# Patient Record
Sex: Female | Born: 1960 | Race: White | Hispanic: No | Marital: Married | Smoking: Never smoker
Health system: Southern US, Community
[De-identification: ages and names within clinical notes are randomized; demographics above are authoritative.]

## PROBLEM LIST (undated history)

## (undated) DIAGNOSIS — M3500 Sicca syndrome, unspecified: Secondary | ICD-10-CM

## (undated) DIAGNOSIS — F329 Major depressive disorder, single episode, unspecified: Secondary | ICD-10-CM

## (undated) DIAGNOSIS — I10 Essential (primary) hypertension: Secondary | ICD-10-CM

## (undated) DIAGNOSIS — K8020 Calculus of gallbladder without cholecystitis without obstruction: Secondary | ICD-10-CM

## (undated) DIAGNOSIS — M1990 Unspecified osteoarthritis, unspecified site: Secondary | ICD-10-CM

## (undated) DIAGNOSIS — F32A Depression, unspecified: Secondary | ICD-10-CM

## (undated) DIAGNOSIS — M797 Fibromyalgia: Secondary | ICD-10-CM

## (undated) HISTORY — DX: Sjogren syndrome, unspecified: M35.00

## (undated) HISTORY — DX: Essential (primary) hypertension: I10

## (undated) HISTORY — DX: Unspecified osteoarthritis, unspecified site: M19.90

## (undated) HISTORY — PX: ABDOMINAL HYSTERECTOMY: SHX81

## (undated) HISTORY — DX: Depression, unspecified: F32.A

## (undated) HISTORY — DX: Major depressive disorder, single episode, unspecified: F32.9

## (undated) HISTORY — DX: Fibromyalgia: M79.7

---

## 2000-05-12 ENCOUNTER — Encounter: Payer: Self-pay | Admitting: General Surgery

## 2000-05-16 ENCOUNTER — Ambulatory Visit (HOSPITAL_COMMUNITY): Admission: RE | Admit: 2000-05-16 | Discharge: 2000-05-16 | Payer: Self-pay | Admitting: General Surgery

## 2000-05-16 ENCOUNTER — Encounter: Payer: Self-pay | Admitting: General Surgery

## 2001-02-22 ENCOUNTER — Ambulatory Visit (HOSPITAL_COMMUNITY): Admission: RE | Admit: 2001-02-22 | Discharge: 2001-02-22 | Payer: Self-pay | Admitting: Cardiology

## 2001-05-30 ENCOUNTER — Ambulatory Visit (HOSPITAL_COMMUNITY)
Admission: RE | Admit: 2001-05-30 | Discharge: 2001-05-30 | Payer: Self-pay | Admitting: Certified Registered Nurse Anesthetist

## 2001-10-19 ENCOUNTER — Encounter: Payer: Self-pay | Admitting: Family Medicine

## 2001-10-19 ENCOUNTER — Ambulatory Visit (HOSPITAL_COMMUNITY): Admission: RE | Admit: 2001-10-19 | Discharge: 2001-10-19 | Payer: Self-pay | Admitting: Family Medicine

## 2001-11-14 ENCOUNTER — Inpatient Hospital Stay (HOSPITAL_COMMUNITY): Admission: RE | Admit: 2001-11-14 | Discharge: 2001-11-16 | Payer: Self-pay | Admitting: Obstetrics and Gynecology

## 2001-11-14 ENCOUNTER — Encounter (INDEPENDENT_AMBULATORY_CARE_PROVIDER_SITE_OTHER): Payer: Self-pay | Admitting: Specialist

## 2003-05-05 ENCOUNTER — Encounter: Payer: Self-pay | Admitting: Family Medicine

## 2003-05-05 ENCOUNTER — Ambulatory Visit (HOSPITAL_COMMUNITY): Admission: RE | Admit: 2003-05-05 | Discharge: 2003-05-05 | Payer: Self-pay | Admitting: Family Medicine

## 2003-05-30 ENCOUNTER — Ambulatory Visit (HOSPITAL_COMMUNITY): Admission: RE | Admit: 2003-05-30 | Discharge: 2003-05-30 | Payer: Self-pay | Admitting: Cardiology

## 2004-09-15 ENCOUNTER — Ambulatory Visit: Payer: Self-pay | Admitting: Cardiology

## 2004-09-21 ENCOUNTER — Ambulatory Visit: Payer: Self-pay | Admitting: *Deleted

## 2004-09-21 ENCOUNTER — Ambulatory Visit (HOSPITAL_COMMUNITY): Admission: RE | Admit: 2004-09-21 | Discharge: 2004-09-21 | Payer: Self-pay | Admitting: *Deleted

## 2005-04-06 ENCOUNTER — Ambulatory Visit (HOSPITAL_COMMUNITY): Admission: RE | Admit: 2005-04-06 | Discharge: 2005-04-06 | Payer: Self-pay | Admitting: Obstetrics and Gynecology

## 2005-09-16 ENCOUNTER — Emergency Department (HOSPITAL_COMMUNITY): Admission: EM | Admit: 2005-09-16 | Discharge: 2005-09-16 | Payer: Self-pay | Admitting: Emergency Medicine

## 2005-09-20 ENCOUNTER — Ambulatory Visit: Payer: Self-pay | Admitting: Cardiology

## 2006-10-13 ENCOUNTER — Ambulatory Visit: Payer: Self-pay | Admitting: Cardiology

## 2006-10-20 ENCOUNTER — Ambulatory Visit (HOSPITAL_COMMUNITY): Admission: RE | Admit: 2006-10-20 | Discharge: 2006-10-20 | Payer: Self-pay | Admitting: Family Medicine

## 2007-10-23 ENCOUNTER — Ambulatory Visit: Payer: Self-pay | Admitting: Cardiology

## 2008-10-21 ENCOUNTER — Ambulatory Visit (HOSPITAL_COMMUNITY): Admission: RE | Admit: 2008-10-21 | Discharge: 2008-10-21 | Payer: Self-pay | Admitting: Family Medicine

## 2008-10-30 ENCOUNTER — Ambulatory Visit: Payer: Self-pay | Admitting: Cardiology

## 2008-10-31 ENCOUNTER — Ambulatory Visit: Payer: Self-pay | Admitting: Cardiology

## 2008-10-31 ENCOUNTER — Encounter: Payer: Self-pay | Admitting: Cardiology

## 2008-10-31 ENCOUNTER — Ambulatory Visit (HOSPITAL_COMMUNITY): Admission: RE | Admit: 2008-10-31 | Discharge: 2008-10-31 | Payer: Self-pay | Admitting: Cardiology

## 2009-03-19 ENCOUNTER — Ambulatory Visit (HOSPITAL_COMMUNITY): Admission: RE | Admit: 2009-03-19 | Discharge: 2009-03-19 | Payer: Self-pay | Admitting: Family Medicine

## 2009-04-29 ENCOUNTER — Encounter: Payer: Self-pay | Admitting: Gastroenterology

## 2009-05-14 ENCOUNTER — Ambulatory Visit: Payer: Self-pay | Admitting: Gastroenterology

## 2009-05-14 ENCOUNTER — Ambulatory Visit (HOSPITAL_COMMUNITY): Admission: RE | Admit: 2009-05-14 | Discharge: 2009-05-14 | Payer: Self-pay | Admitting: Gastroenterology

## 2009-05-14 ENCOUNTER — Encounter: Payer: Self-pay | Admitting: Gastroenterology

## 2009-07-02 ENCOUNTER — Encounter (INDEPENDENT_AMBULATORY_CARE_PROVIDER_SITE_OTHER): Payer: Self-pay | Admitting: *Deleted

## 2009-07-02 LAB — CONVERTED CEMR LAB
Albumin: 4.1 g/dL
CO2: 23 meq/L
Calcium: 9.2 mg/dL
Chloride: 103 meq/L
HDL: 54 mg/dL
LDL Cholesterol: 86 mg/dL
Potassium: 4.5 meq/L
Sodium: 141 meq/L
T3, Free: 109.9 pg/mL
TSH: 2.602 microintl units/mL
Total Protein: 7.4 g/dL
Triglycerides: 207 mg/dL

## 2009-12-14 ENCOUNTER — Encounter (INDEPENDENT_AMBULATORY_CARE_PROVIDER_SITE_OTHER): Payer: Self-pay | Admitting: *Deleted

## 2009-12-18 ENCOUNTER — Ambulatory Visit: Payer: Self-pay | Admitting: Cardiology

## 2009-12-18 DIAGNOSIS — I10 Essential (primary) hypertension: Secondary | ICD-10-CM | POA: Insufficient documentation

## 2009-12-18 DIAGNOSIS — I429 Cardiomyopathy, unspecified: Secondary | ICD-10-CM | POA: Insufficient documentation

## 2009-12-18 DIAGNOSIS — E663 Overweight: Secondary | ICD-10-CM | POA: Insufficient documentation

## 2010-10-13 ENCOUNTER — Ambulatory Visit: Payer: Self-pay | Admitting: Cardiology

## 2010-10-21 DIAGNOSIS — F329 Major depressive disorder, single episode, unspecified: Secondary | ICD-10-CM | POA: Insufficient documentation

## 2010-10-22 ENCOUNTER — Emergency Department (HOSPITAL_COMMUNITY)
Admission: EM | Admit: 2010-10-22 | Discharge: 2010-10-22 | Payer: Self-pay | Source: Home / Self Care | Admitting: Emergency Medicine

## 2010-11-14 ENCOUNTER — Encounter: Payer: Self-pay | Admitting: Family Medicine

## 2010-11-25 NOTE — Assessment & Plan Note (Signed)
Summary: pt has c/o some chest pain/t  Medications Added SERTRALINE HCL 50 MG TABS (SERTRALINE HCL) 1 tablet by mouth once daily EVENING PRIMROSE OIL 500 MG CAPS (EVENING PRIMROSE OIL) take 1 tablet by mouth at bedtime TOPAMAX 100 MG TABS (TOPIRAMATE) 1 and 1/2 tablet by mouth at bedtime FUROSEMIDE 20 MG TABS (FUROSEMIDE) as needed      Allergies Added: ! * SULFA  Primary Provider:  Dr. Margo Aye at [facility] in [place]   History of Present Illness: Brittany Sweeney is a 50 y/o CF with history of cardiomyopathy with reduced EF in 2001.  She has since regained normal LV fx but has mild diastolic dysfunction with medical therapy.  She continues to have generalized body aches and pains and has been diagnosed with fibromyalgia.  She was placed on unknown medication for this, but states she  stopped taking it because "it made me feel worse."  She has lost 17 lbs since been seen last stating that a episode of severe depression in May.  She has since felt better and returned to work aftet taking 7 weeks off. She is without complaint otherwise.  Preventive Screening-Counseling & Management  Alcohol-Tobacco     Smoking Status: never  Current Problems (verified): 1)  Overweight/obesity  (ICD-278.02) 2)  Cardiomyopathy, Secondary  (ICD-425.9) 3)  Hypertension, Unspecified  (ICD-401.9)  Current Medications (verified): 1)  Coreg 12.5 Mg Tabs (Carvedilol) .... Take 1/2 Tab Two Times A Day 2)  Wellbutrin Xl 150 Mg Xr24h-Tab (Bupropion Hcl) .[name].. 1 Tab By Mouth Two Times A Day 3)  Aspirin Ec 325 Mg Tbec (Aspirin) .... Take One Tablet By Mouth Daily 4)  B-100 Complex  Tabs (Vitamins-Lipotropics) .... Take 1 Tab Daily 5)  Vitamin C Cr 500 Mg Cr-Tabs (Ascorbic Acid) .... Take 1 Tab Daily 6)  Sertraline Hcl 50 Mg Tabs (Sertraline Hcl) .[name].. 1 Tablet By Mouth Once Daily 7)  Evening Primrose Oil 500 Mg Caps (Evening Primrose Oil) .... Take 1 Tablet By Mouth At Bedtime 8)  Topamax 100 Mg Tabs  (Topiramate) .[name].. 1 and 1/2 Tablet By Mouth At Bedtime 9)  Furosemide 20 Mg Tabs (Furosemide) .... As Needed  Allergies (verified): 1)  ! * Sulfa  Past History:  Past Medical History: Depression Fibromyalgia  Social History: Smoking Status:  never  Review of Systems       Generalized muscle aches and pains.  All other systems have been reviewed and are negative unless stated above.   Vital Signs:  Patient profile:   50 year old female Height:      65 inches Weight:      193 pounds BMI:     32.23 O2 Sat:      98 % Pulse rate:   82 / minute BP sitting:   129 / 81  (left arm)  Vitals Entered ByLarita Fife Via LPN (October 21, 2010 1:27 PM)  Physical Exam  General:  Well developed, well nourished, in no acute distress. Lungs:  Clear bilaterally to auscultation and percussion. Heart:  Non-displaced PMI, chest non-tender; regular rate and rhythm, S1, S2 without murmurs, rubs or gallops. Carotid upstroke normal, no bruit. Normal abdominal aortic size, no bruits. Femorals normal pulses, no bruits. Pedals normal pulses. No edema, no varicosities. Abdomen:  Bowel sounds positive; abdomen soft and non-tender without masses, organomegaly, or hernias noted. No hepatosplenomegaly. Msk:  joint tenderness.   Pulses:  pulses normal in all 4 extremities Extremities:  No clubbing or cyanosis. Neurologic:  Alert and  oriented x 3. Skin:  Intact without lesions or rashes. Psych:  depressed affect.     Impression & Recommendations:  Problem # 1:  CARDIOMYOPATHY, SECONDARY (ICD-425.9) She appears stable at this time.  Do not feel that she needs to be on lasix daily. I have advised her to take this as needed.  She prefers to take this daily to decrease fluid retention with menopause.   Her updated medication list for this problem includes:    Coreg 12.5 Mg Tabs (Carvedilol) .[name]... Take 1/2 tab two times a day    Aspirin Ec 325 Mg Tbec (Aspirin) .[name]... Take one tablet by mouth daily     Furosemide 20 Mg Tabs (Furosemide) .[name]... As needed  Problem # 2:  HYPERTENSION, UNSPECIFIED (ICD-401.9) Excellent control of BP at this time.   Her updated medication list for this problem includes:    Coreg 12.5 Mg Tabs (Carvedilol) .[name]... Take 1/2 tab two times a day    Aspirin Ec 325 Mg Tbec (Aspirin) .[name]... Take one tablet by mouth daily    Furosemide 20 Mg Tabs (Furosemide) .[name]... As needed  Problem # 3:  DEPRESSION (ICD-311) She is on multiple medications for same.  She wishes to talk with a counselor.  I have given her the name of Calton Dach LCSW in [place].  She will follow up on this on her own if she chooses to do so.  Patient Instructions: 1)  Your physician recommends that you schedule a follow-up appointment in: 6 months 2)  Your physician recommends that you continue on your current medications as directed. Please refer to the Current Medication list given to you today.  Prevention & Chronic Care Immunizations   Influenza vaccine: Not documented    Tetanus booster: Not documented    Pneumococcal vaccine: Not documented  Colorectal Screening   Hemoccult: Not documented    Colonoscopy: Not documented  Other Screening   Pap smear: Not documented    Mammogram: Not documented   Smoking status: never  (10/21/2010)  Lipids   Total Cholesterol: 181  (07/02/2009)   LDL: 86  (07/02/2009)   LDL Direct: Not documented   HDL: 54  (07/02/2009)   Triglycerides: 207  (07/02/2009)  Hypertension   Last Blood Pressure: 129 / 81  (10/21/2010)   Serum creatinine: 0.78  (07/02/2009)   Serum potassium 4.5  (07/02/2009)  Self-Management Support :    Hypertension self-management support: Not documented

## 2010-11-25 NOTE — Assessment & Plan Note (Signed)
Summary: rov  Medications Added WELLBUTRIN XL 150 MG XR24H-TAB (BUPROPION HCL) 1 tab by mouth two times a day ASPIRIN EC 325 MG TBEC (ASPIRIN) Take one tablet by mouth daily B-100 COMPLEX  TABS (VITAMINS-LIPOTROPICS) take 1 tab daily VITAMIN C CR 500 MG CR-TABS (ASCORBIC ACID) take 1 tab daily      Allergies Added: NKDA  Primary Provider:  Dr. Parke Simmers  CC:  Pt. c/o CP on left side of chest, SOB, and swelling in hands and feet and dizziness.[name]  History of Present Illness: Brittany Sweeney comes in today for evaluation management of her hypertension and secondary cardiomyopathy.  She says that she's been working weight to many hours up to 12 hour days. Her fibromyalgia is really bothering her. She's on her she should cut back at work.  She's compliant with her medications. She continues to gain weight. Echocardiogram last year showed normal left ventricular systolic function with no significant LVH. She did have some mild diastolic dysfunction. Right-sided pressures appear to be normal.  She denies any chest pain, dyspnea on exertion, orthopnea, PND peripheral edema.  Current Medications (verified): 1)  Coreg 12.5 Mg Tabs (Carvedilol) .... Take 1/2 Tab Two Times A Day 2)  Wellbutrin Xl 150 Mg Xr24h-Tab (Bupropion Hcl) .[name].. 1 Tab By Mouth Two Times A Day 3)  Aspirin Ec 325 Mg Tbec (Aspirin) .... Take One Tablet By Mouth Daily 4)  B-100 Complex  Tabs (Vitamins-Lipotropics) .... Take 1 Tab Daily 5)  Vitamin C Cr 500 Mg Cr-Tabs (Ascorbic Acid) .... Take 1 Tab Daily  Allergies (verified): No Known Drug Allergies  Review of Systems       negative other than history of present illness  Vital Signs:  Patient profile:   50 year old female Height:      65 inches Weight:      220 pounds BMI:     36.74 O2 Sat:      97 % Pulse rate:   86 / minute BP sitting:   125 / 80  (left arm)  Physical Exam  General:  obese.   Head:  normocephalic and atraumatic Eyes:  PERRLA/EOM intact;  conjunctiva and lids normal. Mouth:  Teeth, gums and palate normal. Oral mucosa normal. Neck:  Neck supple, no JVD. No masses, thyromegaly or abnormal cervical nodes. Chest Dreydon Cardenas:  no deformities or breast masses noted Lungs:  Clear bilaterally to auscultation and percussion. Heart:  PMI poorly appreciated, normal S1-S2, regular rate and rhythm, no gallop Msk:  Back normal, normal gait. Muscle strength and tone normal. Pulses:  pulses normal in all 4 extremities Extremities:  No clubbing or cyanosis. Neurologic:  Alert and oriented x 3. Skin:  Intact without lesions or rashes. Psych:  Normal affect.   EKG  Procedure date:  12/18/2009  Findings:      normal sinus rhythm, normal EKG.  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 Take 1/2 tab two times a day    Aspirin Ec 325 Mg Tbec (Aspirin) .[name]... Take one tablet by mouth daily  Problem # 2:  HYPERTENSION, UNSPECIFIED (ICD-401.9) Assessment: Improved  Her updated medication list for this problem includes:    Coreg 12.5 Mg Tabs (Carvedilol) .[name]... Take 1/2 tab two times a day    Aspirin Ec 325 Mg Tbec (Aspirin) .[name]... Take one tablet by mouth daily  Problem # 3:  OVERWEIGHT/OBESITY (ICD-278.02) Assessment: Deteriorated  Patient Instructions: 1)  Your physician recommends that you schedule a follow-up appointment in: 1 year 2)  Your physician recommends that you continue on your current medications as directed. Please refer to the Current Medication list given to you today.

## 2010-11-25 NOTE — Miscellaneous (Signed)
Summary: labs cmp,lipids,tsh,t4,t3,07/02/2009  Clinical Lists Changes  Observations: Added new observation of CALCIUM: 9.2 mg/dL (52/84/1324 40:10) Added new observation of ALBUMIN: 4.1 g/dL (27/25/3664 40:34) Added new observation of PROTEIN, TOT: 7.4 g/dL (74/25/9563 87:56) Added new observation of SGPT (ALT): 20 units/L (07/02/2009 10:23) Added new observation of SGOT (AST): 27 units/L (07/02/2009 10:23) Added new observation of ALK PHOS: 69 units/L (07/02/2009 10:23) Added new observation of GFR: 133.08 mL/min (07/02/2009 10:23) Added new observation of CREATININE: 0.78 mg/dL (43/32/9518 84:16) Added new observation of BUN: 13 mg/dL (60/63/0160 10:93) Added new observation of BG RANDOM: 91 mg/dL (23/55/7322 02:54) Added new observation of CO2 PLSM/SER: 23 meq/L (07/02/2009 10:23) Added new observation of CL SERUM: 103 meq/L (07/02/2009 10:23) Added new observation of K SERUM: 4.5 meq/L (07/02/2009 10:23) Added new observation of NA: 141 meq/L (07/02/2009 10:23) Added new observation of LDL: 86 mg/dL (27/03/2375 28:31) Added new observation of HDL: 54 mg/dL (51/76/1607 37:10) Added new observation of TRIGLYC TOT: 207 mg/dL (62/69/4854 62:70) Added new observation of CHOLESTEROL: 181 mg/dL (35/00/9381 82:99) Added new observation of TSH: 2.602 microintl units/mL (07/02/2009 10:23) Added new observation of T4, FREE: 6.7 ng/dL (37/16/9678 93:81) Added new observation of T3 FREE: 109.9 pg/mL (07/02/2009 10:23)

## 2011-03-08 NOTE — Assessment & Plan Note (Signed)
[facility]                       [facility] CARDIOLOGY OFFICE NOTE   Brittany Sweeney, Brittany Sweeney                     MRN:          [id number]  DATE:10/30/2008                            DOB:          09-Mar-1961    Brittany Sweeney comes in today for followup.  I saw her initially in 2001 for  reduced LV systolic function probably secondary to hypertension.  After  getting her blood pressure under control, her LV function improved.  Her  last echo actually was September 21, 2004, which showed normal left  ventricular systolic function with no significant hypertrophy.   She continues unfortunately gaining weight.  Her weight is now to 217  from 203 from October 23, 2007.   She has had a very stressful year in work.  She has had to work night  shifts and also had to get back to the floor.  She made a comment that  it has really been hard on her.  She still works incredibly hard.   She has had a lot of problems with fibromyalgia and Dr. Parke Simmers has placed  her on [place] daily.  She wants to know if that was associated with  weight gain, however, side effects are usually decreased appetite and  weight loss.   MEDICATIONS:  1. Coreg 6.25 b.i.d.  2. Lexapro 10 mg a day.  3. Vitamin B.  4. Primrose oil.  5. Savella daily.   She takes omeprazole p.r.n. as well as Lasix 20 mg p.r.n.   PHYSICAL EXAMINATION:  VITAL SIGNS:  Her blood pressure is excellent  today at 100/80, her pulse is 76 and regular.  She weighs 217.  HEENT:  Unremarkable.  NECK:  Carotid upstrokes were equal bilaterally without bruits, no JVD.  Thyroid is not enlarged.  Trachea is midline.  LUNGS:  Clear to auscultation and percussion.  HEART:  A poorly appreciated PMI, soft S1 and S2.  No S3 gallop.  ABDOMEN:  Soft, good bowel sounds.  No midline bruit.  No pulsatile  mass.  EXTREMITIES:  No cyanosis, clubbing, or edema.  Pulses are brisk.  NEUROLOGIC:  Intact.   Electrocardiogram is  normal.   Ms. Kass's blood pressure is under excellent control.  She does have  some episodes of swelling in her legs requiring furosemide.  The left  leg seems to be worse in the right.  It has been about 4 years since she  has had a 2-D echocardiogram, we will reassess this with a  repeat echo.  I have encouraged her to try and lose weight.  We will see  her back again in 6 months.     Thomas C. Daleen Squibb, MD, [facility]  Electronically Signed    TCW/MedQ  DD: 10/30/2008  DT: 10/31/2008  Job #: [id number]   cc:   Renaye Rakers, M.D.

## 2011-03-08 NOTE — Assessment & Plan Note (Signed)
[facility]                            CARDIOLOGY OFFICE NOTE   Brittany Sweeney, Brittany Sweeney                     MRN:          [id number]  DATE:10/23/2007                            DOB:          05/14/1961    [name] returns today for further management of the following  issues.  1. History of hypertensive cardiomyopathy, now resolved with good      blood pressure control.  2. Hypertension well-controlled on Carvedilol  3. Continued weight gain.   She continues to work very hard.  She has off this week and had off week  of Christmas.   She was not feeling well on Wellbutrin and we switched her to Lexapro 10  mg a day.  She is doing better on this.   She takes Lasix 20 mg p.r.n. for swelling.   EXAM:  Today her blood pressure is 110/64, pulse 68 and regular.  Weight  is 203, up 7 more pounds.  HEENT:  Normocephalic atraumatic.  PERRLA.  Extraocular movements  intact.  Sclerae are clear.  Dentition satisfactory.  Neck is supple.  Carotids were equal bilateral bruits, no JVD.  Thyroid  is not enlarged.  Trachea is midline.  Lungs were clear.  HEART:  Reveals a poorly appreciated PMI.  Normal S1-S2.  No gallop.  ABDOMEN:  Exam is soft, good bowel sounds.  EXTREMITIES:  1+ edema.  Pulses are intact.  NEURO:  Exam is intact.   I have asked [name] to try and get control of her weight and  certainly not gain any more.  I have encouraged regular exercise as  usual.  We renewed her Carvedilol by E-prescribe.  We made no other  changes.  Will plan on seeing her back in a year.     Thomas C. Daleen Squibb, MD, [facility]  Electronically Signed    TCW/MedQ  DD: 10/23/2007  DT: 10/23/2007  Job #: [id number]   cc:   Renaye Rakers, M.D.

## 2011-03-11 NOTE — Op Note (Signed)
[facility]  Patient:    Brittany Sweeney, Brittany Sweeney Visit Number: [id number] MRN: [id number]          Service Type: GYN Location: 4W 0455 01 Attending Physician:  Lendon Colonel Dictated by:   Kathie Rhodes. Kyra Manges, M.D. Proc. Date: 11/14/01 Admit Date:  11/14/2001                             Operative Report  PREOPERATIVE DIAGNOSES: 1. Dysmenorrhea. 2. Stress urinary incontinence. 3. Pelvic pain. 4. Large cystocele and rectocele.  POSTOPERATIVE DIAGNOSES: 1. Dysmenorrhea. 2. Stress urinary incontinence. 3. Pelvic pain. 4. Large cystocele and rectocele.  OPERATION: 1. Exploratory laparotomy. 2. Total abdominal hysterectomy. 3. Left salpingo-oophorectomy. 4. Fulguration of endometriosis of right ovary. 5. Repair of enterocele. 6. Vaginal vault suspension, Burch procedure. 7. Posterior repair.  SURGEON:  Katherine Roan, M.D.  ANESTHESIA:  DESCRIPTION OF PROCEDURE:  The patient was placed in the supine position and prepped and draped in the usual fashion. A Foley catheter was inserted. The patient was in the low lithotomy position and the abdomen was entered through a transverse approach. Exploration of the upper abdomen revealed a smooth liver. The gallbladder was quite tense. I felt no stones. The kidneys were normal. No significant adenopathy was noted. The abdominal viscera were then carefully packed away from the pelvic viscera with soaked sponged. The uterus was elevated. The left ovary was adherent to the pelvic sidewall and had endometriosis on its surface. I freed up the left infundibulopelvic ligament and round ligament and removed the left ovary. Cornual aspects of the uterus were grasped and the right ovary was preserved, although it had surface endometriosis on it. Cornual aspects of the utero-ovarian ligament on the right were clamped and ligated as well as the round ligament. The ovary was then fulgurated and the endometriosis  was fulgurated. The bladder flap was dissected off the lower uterine segment. There was a marked amount of scarring in the uterus, particularly on the right side. Both uterine arteries were skeletonized and ligated. Cardinals and uterosacral ligaments were identified, the bladder was pushed off the lower segment. The angles of the vagina were entered and the specimen was removed from the operative field. The vaginal vault was then reconstructed, after closing the vagina, with figure-of-eight of 0 Vicryls. Following this, I suspended the vagina with the uterosacral ligaments and cardinal ligaments and then I repaired the posterior enterocele with interrupted sutures of 2-0 Ethibond. This effected a good vault support. Hemostasis was secure. I closed over the anterior surface of the bladder with interrupted sutures of 3-0 Vicryl. We then closed the peritoneal cavity, sponge and needle count were correct, and dissected the space of Retzius. WE then identified the vagina and suspended the vagina with 0 Ethibond to the inguinal ligament. This effected an excellent vault support. Following this, we irrigated the retroperitoneal space and then closed the fascia with interrupted and continuous 2-0 PDS suture. The subcutaneum was closed and then the skin was closed with 4-0 PDS. The incision was infiltrated with 0.5% Marcaine with epinephrine. We went down below and dissected the perineal body and dissected the vagina from the underlying rectum and then plicated the _______ fascia in the midline to repair the rectocele; 3-0 Vicryl was used. The vagina was then excised and closed with a running suture of 3-0 chromic. Pack was not inserted. Perineal skin was closed with interrupted sutures of 3-0 Vicryl and a continuation  of the subcuticular 3-0 chromic. The vagina was hemostatically secure. The Foley catheter drained clear urine. Bisma tolerated the procedure well. Estimated blood loss was about 200  cc. She was brought to the recovery room in good condition. Dictated by:   S. Kyra Manges, M.D. Attending Physician:  Lendon Colonel DD:  11/14/01 TD:  11/15/01 Job: [id number]

## 2011-03-11 NOTE — Assessment & Plan Note (Signed)
[facility]                            CARDIOLOGY OFFICE NOTE   Brittany Sweeney, Brittany Sweeney                     MRN:          [id number]  DATE:10/13/2006                            DOB:          11/20/1960    Brittany Sweeney returns today for further management of her hypertension.  She initially had a hypertension cardiomyopathy but her EF and LV  function normalized with appropriate therapy.   Her last 2D echocardiogram was in 2005.  This showed an EF that was  essentially normal and has been as high as 55-60% in the past.   She has had a good year.  She is working hard third shift, stands on the  concrete a lot.  She has some lower extremity edema when she gets home.  She denies any orthopnea, PND, or dyspnea on exertion.   Unfortunately she has not lost any weight.   MEDICATIONS:  1. Coreg 6.25 b.i.d.  2. Wellbutrin 300 mg a day.   Her blood pressure today is 129/83, pulse 71 and regular, her weight is  196.  HEENT:  Normocephalic/atraumatic, PERRLA, extraocular muscles are  intact, sclerae are clear, facial symmetry is normal, dentition  satisfactory.  Carotid upstrokes are equal bilaterally without bruits,  no JVD, thyroid is not enlarged, trachea is midline.  LUNGS:  Clear to auscultation and percussion.  HEART:  Reveals a nondisplaced PMI, she has normal S1, S2.  There is no  gallop, there is no murmur.  ABDOMEN:  Soft with good bowel sounds, no midline bruit.  EXTREMITIES:  No cyanosis, clubbing, there is trivial edema, pulses are  present.  NEURO:  Exam is intact.  MUSCULOSKELETAL:  Intact.  SKIN:  Intact.   EKG shows sinus rhythm with non specific T-wave changes.   ASSESSMENT/PLAN:  Brittany Sweeney is stable.  I have renewed her carvedilol  as a generic, 6.25 mg b.i.d. for a year.  We will see her back in a  year.     Thomas C. Daleen Squibb, MD, [facility]  Electronically Signed   TCW/MedQ  DD: 10/13/2006  DT: 10/14/2006  Job #: [id number]   cc:    Renaye Rakers, M.D.

## 2011-03-11 NOTE — Op Note (Signed)
Brittany Sweeney, Brittany Sweeney              ACCOUNT NO.:  [id number]   MEDICAL RECORD NO.:  [id number]          PATIENT TYPE:  AMB   LOCATION:  DAY                           FACILITY:  [facility]   PHYSICIAN:  Kassie Mends, M.D.      DATE OF BIRTH:  Apr 07, 1961   DATE OF PROCEDURE:  05/18/2009  DATE OF DISCHARGE:  05/14/2009                               OPERATIVE REPORT   REFERRING PHYSICIAN:  Renaye Rakers, MD   PROCEDURE:  Colonoscopy converted to sigmoidoscopy due to poor bowel  prep.   INDICATION FOR EXAM:  [name] is a 50 year old female, who presents  for average risk colon cancer screening.   FINDINGS:  1. Large amount of liquid stool with particulate matter and could not      be aspirated.  The liquid material began in the rectum and extended      onto the distal transverse colon.  The procedure was changed to a      sigmoidoscopy.  Able to aspirate to get adequate visualization of      the left side of the colon.  2. A 3-mm sessile rectal polyp removed via cold forceps.  Otherwise,      no evidence of masses, inflammatory changes, or AVMs.  Polyps less      than 5-mm may have been missed in the left colon.   RECOMMENDATIONS:  1. Brittany Sweeney only took half of her TriLyte prep.  She reportedly had      solid food on yesterday.  2. She should either complete other half of the GoLYTELY or complete      gallon of GoLYTELY and return for colonoscopy.  3. No aspirin, NSAIDs, or anticoagulation for 7 days.  4. She should follow a high-fiber diet.  She is given a handout on      high-fiber diet and polyps.   PROCEDURE TECHNIQUE:  Physical exam was performed.  Informed consent was  obtained from the patient after explaining the benefits, risks, and  alternatives to the procedure.  The patient was connected to the monitor  and placed in the left lateral position.  Continuous oxygen was provided  by nasal cannula and IV medicine administered through an indwelling  cannula.  After  administration of sedation and rectal exam, the  patient's rectum was intubated and the scope was advanced under direct  visualization to the splenic flexure.  The scope was removed slowly by  carefully examining the color, texture, anatomy, and integrity of the  mucosa on the way out.  The patient was recovered in endoscopy and  discharged home in satisfactory condition.   ADDENDUM:  On April 24, 2009, Brittany Sweeney declined to return for a  colonoscopy at this time.  She said, she will call back when she was  ready  be scheduled.  She reportedly was upset because she took 2 days  off the work and she did not have an adequate prep.   PATH:  SIMPLE ADENOMA. Pt will delay TCS due to her fibromyalgia. Pt  urged to return for complete TCS.      Sandi  Cira Servant, M.D.  Electronically Signed     SM/MEDQ  D:  05/18/2009  T:  05/18/2009  Job:  [id number]   cc:   Renaye Rakers, M.D.  Fax: [phone]

## 2011-03-11 NOTE — Procedures (Signed)
   NAMEJAKIERA, Brittany Sweeney                        ACCOUNT NO.:  [id number]   MEDICAL RECORD NO.:  [id number]                   PATIENT TYPE:  OUT   LOCATION:  RAD                                  FACILITY:  [facility]   PHYSICIAN:  Vida Roller, M.D.                DATE OF BIRTH:  11/30/1960   DATE OF PROCEDURE:  05/30/2003  DATE OF DISCHARGE:                                  ECHOCARDIOGRAM   TAPE NUMBER:  [id number].   TAPE COUNT:  [id number] - [id number].   HISTORY OF PRESENT ILLNESS:  This is a 50 year old woman with dyspnea and  hypertension.   DESCRIPTION OF PROCEDURE:  Technical quality of the study is adequate.   M-MODE TRACINGS:  The aorta is 27 mm.   Left atrium is 39 mm.   Septum is 10 mm.   Posterior wall is 8 mm.   Left ventricular diastolic dimension of 45 mm.   Left ventricular systolic dimension is 39 mm.   2-D AND DOPPLER IMAGING:  The left ventricle is normal size with normal  systolic function. Diastolic function is normal as well. There are no wall  motion abnormalities seen.   The right ventricle is normal size with normal systolic function.   Both atria appear to be normal size. There is no obvious atrial septal  defect.   The aortic valve is trileaflet, tricommissural with no evidence of stenosis  or regurgitation.   The mitral valve is morphologically unremarkable with trace insufficiency.  No stenosis is seen.   The tricuspid valve is morphologically unremarkable with trace  insufficiency. No stenosis is seen.   The pulmonic valve is morphologically unremarkable with trace insufficiency.  No stenosis is seen.   The inferior vena cava is normal size.   The ascending aorta is slightly enlarged.   The pericardial structures appear normal.                                               Vida Roller, M.D.    JH/MEDQ  D:  05/30/2003  T:  05/31/2003  Job:  [id number]   cc:   Renaye Rakers, M.D.  [phone] [address], [address]  [place]  [place] [address]  Fax:  [phone]

## 2011-03-11 NOTE — Discharge Summary (Signed)
[facility]  Patient:    LORENZA, SHAKIR DAWN Visit Number: [id number] MRN: [id number]          Service Type: GYN Location: 4W 0455 01 Attending Physician:  Lendon Colonel Dictated by:   Kathie Rhodes. Kyra Manges, M.D. Admit Date:  11/14/2001 Discharge Date: 11/16/2001                             Discharge Summary  ADMISSION DIAGNOSES: 1. Severe dysmenorrhea. 2. Stress urinary incontinence. 3. Genital prolapse.  DISCHARGE DIAGNOSIS:  Endometriosis.  OPERATION PERFORMED:  Total abdominal hysterectomy, left salpingo-oophorectomy, fulguration of endometriosis of right ovary, repair of enterocele, vaginal vault suspension, perineoplasty, posterior repair, and Birch procedure.  HISTORY OF PRESENT ILLNESS:  Ms. Chillemi is a 50 year old female who had conservative attempts at handling dysmenorrhea, but was to no avail.  In addition to that, she had complete pelvic relaxation with anterior and posterior segment relaxation.  She had a left ovarian cyst, and the presumptive diagnosis was then in addition to all the relaxation, she had endometriosis.  Detailed informed consent was given.  After appropriate counseling, the patient was admitted to the hospital.  LABORATORY DATA:  Hemoglobin preoperatively 12.6, hematocrit 36.  Metabolic profile was normal.  HOSPITAL COURSE:  On admission to the preoperative ward, the patient was given and orders were written for IV Mefoxin to cover her surgical procedure. Anesthesia gave her ampicillin and gentamicin because of the alleged mitral valve prolapse, though there was no murmur and no echocardiogram evidence of mitral valve prolapse.  At the time of surgery, endometriosis was noted in the left ovary with brownish discharge upon manipulating the left ovary.  This was stuck to the left pelvic side wall, and this was removed.  The uterus removed. There was endometriosis on the surface of the right ovary, and this  was fulgurated in an attempt to be conservative in retaining the function of that ovary.  At the time of intra-abdominal surgery, vault suspension and enterocele repair was accomplished.  Birch procedure and posterior repair and perineoplasty was done following this.  Her postoperative course was uncomplicated.  She voided on the first postoperative day, and was sent home on the second postoperative day with oral and written instructions.  She is encouraged to call for fever, bleeding, or any other problems she may encounter.  She was specifically told to avoid constipation and Valsalva maneuver.  CONDITION ON DISCHARGE:  Improved. Dictated by:   S. Kyra Manges, M.D. Attending Physician:  Lendon Colonel DD:  11/21/01 TD:  11/21/01 Job: [id number]

## 2011-03-11 NOTE — Procedures (Signed)
Brittany Sweeney, Brittany Sweeney              ACCOUNT NO.:  [id number]   MEDICAL RECORD NO.:  [id number]          PATIENT TYPE:  OUT   LOCATION:  RAD                           FACILITY:  [facility]   PHYSICIAN:  Vida Roller, M.D.   DATE OF BIRTH:  [birth year]   DATE OF PROCEDURE:  09/21/2004  DATE OF DISCHARGE:                                  ECHOCARDIOGRAM   TAPE NUMBER:  [id number], tape count [id number] through 1975.   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  No  evidence of left ventricular hypertrophy.  No evidence of significant  valvular heart disease, essentially a normal echocardiogram.   TECHNICAL QUALITY:  Today's study is technically adequate.   M-MODE TRACINGS:  1.  The aorta is 33 mm.  2.  The left atrium is 36 mm.  3.  The septum is 10 mm.  4.  The posterior wall is 9 mm.  5.  The left ventricular diastolic dimension is 44 mm.  6.  The left ventricular systolic dimension is 33 mm.   2-D AND DOPPLER IMAGING:  1.  The left ventricle is normal size with normal systolic function.  There      is no hypertrophy seen. There are no wall motion abnormalities seen.  2.  The right ventricle is normal size with normal systolic function.  3.  Both atria appear to be normal size.  4.  The aortic valve is trileaflet throughout, commissural, with no evidence      of stenosis or regurgitation.  5.  The mitral valve is morphologically unremarkable with mild      regurgitation.  No stenosis is seen.  6.  The tricuspid valve has trace to mild regurgitation.  7.  The pulmonic valve has trace regurgitation.  8.  There is no pericardial effusion.  9.  The inferior vena cava is normal size.  10. The ascending aorta was not well seen.     Trey Paula   JH/MEDQ  D:  09/21/2004  T:  09/21/2004  Job:  [id number]

## 2011-03-11 NOTE — H&P (Signed)
[facility]  Patient:    Brittany Sweeney, Brittany Sweeney Visit Number: [id number] MRN: [id number]          Service Type: OUT Location: RAD Attending Physician:  Beverely Low Dictated by:   Kathie Rhodes. Kyra Manges, M.D. Admit Date:  10/19/2001 Discharge Date: 10/19/2001                           History and Physical  CHIEF COMPLAINT:  Pelvic pain, stress urinary incontinence, history of endometriosis, pelvic pressure, cyst on left ovary.  HISTORY OF PRESENT ILLNESS:  The patient is a 50 year old female, gravida 2, para 1, who presents for a hysterectomy for dysmenorrhea for pelvic pressure, felling that things are falling out, a left adnexal mass, and stress incontinence.  She has been evaluated by the urologist, and our initial impression was to do a vaginal hysterectomy and sling procedure.  However, she developed a cyst on her left ovary and, with the history of endometriosis, abdominal approach was decided on, along with Burch procedure.  Her Pap smear was normal.  Her mammogram was normal.  MEDICATIONS:  Coreg.  BuSpar.  Wellbutrin.  ALLERGIES:  SULFA.  No allergies to latex.  PAST MEDICAL HISTORY: 1. Breast biopsy. 2. Diagnostic laparoscopy with fulguration of focal pelvic endometriosis.  REVIEW OF SYSTEMS:  HEENT:  No headaches or decreased vision or auditory acuity.  HEART:  History of heart murmur and mitral valve prolapse.  She is said to have an enlarged heart.  LUNGS:  No chronic cough, no asthma, no hay fever.  GENITOURINARY:  She has stress urinary incontinence with documented urethral mobility.  She also has a present cystocele.  She notes no urinary tract infections.  LUNGS:  She has no cough or history of asthma. GASTROINTESTINAL:  No bowel habit change, no weight loss or constipation, no anorexia.  MUSCLES, BONES, AND JOINTS:  No fractures.  She works.  Does not socially drink or smoke.  FAMILY HISTORY:  Her mother and father are both living  and well.  She has one brother and two sisters who are living and well.  A maternal grandparent has heart disease, and a paternal aunt has heart disease.  She has a paternal aunt with diabetes.  PHYSICAL EXAMINATION:  GENERAL:  Well-developed, well-nourished female who appears to be her stated age.  VITAL SIGNS:  Weight is 180.  Blood pressure 118/70.  HEENT:  Ears, nose, throat unremarkable.  Oropharynx is not injected.  NECK:  Supple.  Thyroid is not enlarged.  Carotid pulses are equal, without bruits.  No adenopathy appreciated.  BREASTS:  No masses or tenderness.  LUNGS:  Clear to P&A.  HEART:  Normal sinus rhythm.  I do not detect a murmur at this time.  ABDOMEN:  Soft.  Liver, spleen, and kidneys are not palpated.  Bowel sounds are normal, and no bruits are noted.  PELVIC:  A large cystocele and rectocele with uterus in midplane, and there is fullness in the left adnexa.  LABORATORY DATA:  Ultrasound confirmed a large left adnexal mass.  Hemoccult is negative.  IMPRESSION: 1. Stress incontinence with pelvic pressure. 2. Pelvic relaxation with large rectocele and cystocele. 3. History of left adnexal mass, probable endometrioma.  PLAN:  Total abdominal hysterectomy, left salpingo-oophorectomy, Burch procedure, and posterior repair.  The risks and benefits, detailed informed consent has been given to the patient, particularly in regard to the pelvic repair failure rate and pain with intercourse. Dictated  by:   S. Kyra Manges, M.D. Attending Physician:  Beverely Low DD:  11/13/01 TD:  11/14/01 Job: [id number]

## 2011-05-30 ENCOUNTER — Encounter: Payer: Self-pay | Admitting: Adult Health

## 2011-05-30 ENCOUNTER — Ambulatory Visit (INDEPENDENT_AMBULATORY_CARE_PROVIDER_SITE_OTHER): Payer: BC Managed Care – PPO | Admitting: Adult Health

## 2011-05-30 DIAGNOSIS — I10 Essential (primary) hypertension: Secondary | ICD-10-CM

## 2011-05-30 DIAGNOSIS — I429 Cardiomyopathy, unspecified: Secondary | ICD-10-CM

## 2011-05-30 NOTE — Patient Instructions (Signed)
**Note De-identified  Obfuscation** Your physician recommends that you continue on your current medications as directed. Please refer to the Current Medication list given to you today.  Your physician recommends that you schedule a follow-up appointment in: 1 year  

## 2011-05-30 NOTE — Progress Notes (Signed)
HPI:  Brittany Sweeney is a 50 y/o patient of Dr. Dietrich Pates with known history of nonischemic CM, with normal EF from Dec of 2011 assessment. She has a history of fibromyalgia, and depression.  She has been working a lot of hours and is fatigued from this.  Otherwise, she is without complaint of chest pain or DOE.  She has some mild LEE on the left for which she takes an occasional lasix 20 mg.  Otherwise she is asymptomatic.  Allergies  Allergen Reactions  . Sulfonamide Derivatives     REACTION: swelling and hives       EAV:WUJWJX of systems complete and found to be negative unless listed above PHYSICAL EXAM BP 113/74  Pulse 73  Ht 5\' 5"  (1.651 m)  Wt 196 lb (88.905 kg)  BMI 32.62 kg/m2  SpO2 97%  General: Well developed, well nourished, in no acute distress Head: Eyes PERRLA, No xanthomas.   Normal cephalic and atramatic  Lungs: Clear bilaterally to auscultation and percussion. Heart: HRRR S1 S2, with soft S4 murmur.  Pulses are 2+ & equal.            No carotid bruit. No JVD.  No abdominal bruits. No femoral bruits. Abdomen: Bowel sounds are positive, abdomen soft and non-tender without masses or                  Hernia's noted. Msk:  Back normal, normal gait. Normal strength and tone for age. Extremities: No clubbing, cyanosis or edema.  DP +1 Neuro: Alert and oriented X 3. Psych:  Good affect, responds appropriately    ASSESSMENT AND PLAN

## 2011-05-30 NOTE — Assessment & Plan Note (Signed)
Currently well controlled on medical regimen. She has mild Left leg lower edema. She can continue to take lasix prn for this if it becomes progressive.  She is to watch salt intake.

## 2011-05-30 NOTE — Assessment & Plan Note (Signed)
She is stable at present on current medical therapy. She is fatigued in general from fibromyalgia and work schedule. I will not make any changes at this time.  She is to follow-up with [place] in one year unless she becomes symptomatic. She is followed by Dr. Margo Aye in [place] and has had recent labs.

## 2011-06-06 ENCOUNTER — Other Ambulatory Visit: Payer: Self-pay | Admitting: Cardiology

## 2012-04-02 ENCOUNTER — Other Ambulatory Visit: Payer: Self-pay | Admitting: Cardiology

## 2012-04-23 ENCOUNTER — Emergency Department (HOSPITAL_COMMUNITY)
Admission: EM | Admit: 2012-04-23 | Discharge: 2012-04-25 | Disposition: A | Discharge status: Other Acute Inpatient Hospital | Payer: BC Managed Care – PPO | Attending: Emergency Medicine | Admitting: Emergency Medicine

## 2012-04-23 ENCOUNTER — Encounter (HOSPITAL_COMMUNITY): Payer: Self-pay | Admitting: *Deleted

## 2012-04-23 DIAGNOSIS — I10 Essential (primary) hypertension: Secondary | ICD-10-CM | POA: Insufficient documentation

## 2012-04-23 DIAGNOSIS — F329 Major depressive disorder, single episode, unspecified: Secondary | ICD-10-CM

## 2012-04-23 DIAGNOSIS — F3289 Other specified depressive episodes: Secondary | ICD-10-CM | POA: Insufficient documentation

## 2012-04-23 DIAGNOSIS — IMO0001 Reserved for inherently not codable concepts without codable children: Secondary | ICD-10-CM | POA: Insufficient documentation

## 2012-04-23 LAB — CBC WITH DIFFERENTIAL/PLATELET
Basophils Absolute: 0 10*3/uL (ref 0.0–0.1)
Eosinophils Absolute: 0.1 10*3/uL (ref 0.0–0.7)
Eosinophils Relative: 2 % (ref 0–5)
Lymphocytes Relative: 38 % (ref 12–46)
MCH: 31.3 pg (ref 26.0–34.0)
MCHC: 35.5 g/dL (ref 30.0–36.0)
Monocytes Absolute: 0.4 10*3/uL (ref 0.1–1.0)
Monocytes Relative: 7 % (ref 3–12)
Platelets: 229 10*3/uL (ref 150–400)
WBC: 5.4 10*3/uL (ref 4.0–10.5)

## 2012-04-23 LAB — COMPREHENSIVE METABOLIC PANEL
AST: 16 U/L (ref 0–37)
Calcium: 9.7 mg/dL (ref 8.4–10.5)
Chloride: 105 mEq/L (ref 96–112)
Creatinine, Ser: 0.87 mg/dL (ref 0.50–1.10)
Glucose, Bld: 91 mg/dL (ref 70–99)
Potassium: 3.3 mEq/L — ABNORMAL LOW (ref 3.5–5.1)
Sodium: 141 mEq/L (ref 135–145)
Total Bilirubin: 0.3 mg/dL (ref 0.3–1.2)
Total Protein: 7.1 g/dL (ref 6.0–8.3)

## 2012-04-23 LAB — URINALYSIS, ROUTINE W REFLEX MICROSCOPIC
Glucose, UA: NEGATIVE mg/dL
Ketones, ur: NEGATIVE mg/dL
Nitrite: NEGATIVE
Protein, ur: NEGATIVE mg/dL
Specific Gravity, Urine: 1.013 (ref 1.005–1.030)
pH: 6.5 (ref 5.0–8.0)

## 2012-04-23 LAB — RAPID URINE DRUG SCREEN, HOSP PERFORMED
Amphetamines: NOT DETECTED
Barbiturates: NOT DETECTED
Benzodiazepines: NOT DETECTED
Tetrahydrocannabinol: NOT DETECTED

## 2012-04-23 LAB — URINE MICROSCOPIC-ADD ON

## 2012-04-23 LAB — ETHANOL: Alcohol, Ethyl (B): <11 mg/dL (ref 0–11)

## 2012-04-23 MED ORDER — SERTRALINE HCL 50 MG PO TABS
50.0000 mg | ORAL_TABLET | Freq: Every day | ORAL | Status: DC
  Administered 2012-04-24: 50 mg via ORAL
  Filled 2012-04-23: qty 1

## 2012-04-23 MED ORDER — LORAZEPAM 1 MG PO TABS
1.0000 mg | ORAL_TABLET | Freq: Three times a day (TID) | ORAL | Status: DC | PRN
  Administered 2012-04-24: 1 mg via ORAL
  Filled 2012-04-23 (×2): qty 1

## 2012-04-23 MED ORDER — TOPIRAMATE 100 MG PO TABS
100.0000 mg | ORAL_TABLET | Freq: Every day | ORAL | Status: DC
  Administered 2012-04-23 – 2012-04-24 (×2): 100 mg via ORAL
  Filled 2012-04-23 (×2): qty 1

## 2012-04-23 MED ORDER — ZIPRASIDONE HCL 20 MG PO CAPS
20.0000 mg | ORAL_CAPSULE | Freq: Two times a day (BID) | ORAL | Status: DC
  Administered 2012-04-23 – 2012-04-24 (×3): 20 mg via ORAL
  Filled 2012-04-23 (×7): qty 1

## 2012-04-23 MED ORDER — BUPROPION HCL ER (XL) 150 MG PO TB24
150.0000 mg | ORAL_TABLET | Freq: Two times a day (BID) | ORAL | Status: DC
  Administered 2012-04-23 – 2012-04-24 (×3): 150 mg via ORAL
  Filled 2012-04-23 (×5): qty 1

## 2012-04-23 MED ORDER — CARVEDILOL 6.25 MG PO TABS
6.2500 mg | ORAL_TABLET | Freq: Two times a day (BID) | ORAL | Status: DC
  Administered 2012-04-24 (×2): 6.25 mg via ORAL
  Filled 2012-04-23 (×4): qty 1

## 2012-04-23 NOTE — ED Provider Notes (Signed)
History     CSN: [id number]  Arrival date & time 04/23/12  3244   First MD Initiated Contact with Patient 04/23/12 2136      Chief Complaint  Patient presents with  . Anxiety    (Consider location/radiation/quality/duration/timing/severity/associated sxs/prior treatment) HPI Over the last several months this 51 year old female who lives at home alone has been gradually worsening with depression and anxiety with intermittent suicidal thoughts without a plan, for the last month however she has become increasingly agitated at times with worsening mood swings, delusions that people are out to get her, paranoia, she believes people at work are recording her, she has been unable to work for the last month, she thinks people are talking to her when she is out in public when they're not actually talking to her, she has had little to eat or drink over the last month because she believes people are trying to poison her, she has had suicidal thoughts without plan worsening over the last month, she denies any threats to hurt others although she states that she heard the baby cry in her house when there was no baby there, while she was in the waiting room in the emergency department today she states she heard the baby cry again and wanted to go me to shut up and when she told me the story she angrily shook her hands and squeezed them together as if she was strangling someone, she also denies any homicidal ideation at this time. She prefers not to be admitted but also does not want to be involuntarily committed and states that if the recommendation is that she stay inpatient for psychiatric care and she will do so voluntarily. Past Medical History  Diagnosis Date  . Cardiomyopathy   . Fibromyalgia   . Hypertension   . Depression     Past Surgical History  Procedure Date  . Abdominal hysterectomy     No family history on file.  History  Substance Use Topics  . Smoking status: Never Smoker   .  Smokeless tobacco: Never Used  . Alcohol Use: No    OB History    Grav Para Term Preterm Abortions TAB SAB Ect Mult Living                  Review of Systems  Constitutional: Negative for fever.       10 Systems reviewed and are negative for acute change except as noted in the HPI.  HENT: Negative for congestion.   Eyes: Negative for discharge and redness.  Respiratory: Negative for cough and shortness of breath.   Cardiovascular: Negative for chest pain.  Gastrointestinal: Negative for vomiting and abdominal pain.  Musculoskeletal: Negative for back pain.  Skin: Negative for rash.  Neurological: Negative for syncope, numbness and headaches.  Psychiatric/Behavioral: Positive for suicidal ideas and hallucinations. Negative for confusion. The patient is nervous/anxious.        No behavior change.    Allergies  Sulfonamide derivatives  Home Medications   No current outpatient prescriptions on file.  BP 122/55  Pulse 69  Temp 98.1 F (36.7 C) (Oral)  Resp 18  SpO2 100%  Physical Exam  Nursing note and vitals reviewed. Constitutional: She is oriented to person, place, and time.       Awake, alert, nontoxic appearance with baseline speech for patient.  HENT:  Head: Atraumatic.  Mouth/Throat: No oropharyngeal exudate.  Eyes: EOM are normal. Pupils are equal, round, and reactive to light. Right eye exhibits  no discharge. Left eye exhibits no discharge.  Neck: Neck supple.  Cardiovascular: Normal rate and regular rhythm.   No murmur heard. Pulmonary/Chest: Effort normal and breath sounds normal. No stridor. No respiratory distress. She has no wheezes. She has no rales. She exhibits no tenderness.  Abdominal: Soft. Bowel sounds are normal. She exhibits no mass. There is no tenderness. There is no rebound.  Musculoskeletal: She exhibits no tenderness.       Baseline ROM, moves extremities with no obvious new focal weakness.  Lymphadenopathy:    She has no cervical  adenopathy.  Neurological: She is alert and oriented to person, place, and time.       Awake, alert, cooperative and aware of situation; motor strength bilaterally; sensation normal to light touch bilaterally; peripheral visual fields full to confrontation; no facial asymmetry; tongue midline; major cranial nerves appear intact; no pronator drift, normal finger to nose bilaterally, baseline gait without new ataxia.  Skin: No rash noted.  Psychiatric:       Cooperative upon initial examination although very anxious, agitated, delusional, paranoid, possibly hallucinating, with suicidal ideation.    ED Course  Procedures (including critical care time)  Labs Reviewed  URINALYSIS, ROUTINE W REFLEX MICROSCOPIC - Abnormal; Notable for the following:    APPearance HAZY (*)     Leukocytes, UA SMALL (*)     All other components within normal limits  COMPREHENSIVE METABOLIC PANEL - Abnormal; Notable for the following:    Potassium 3.3 (*)     GFR calc non Af Amer 75 (*)     GFR calc Af Amer 87 (*)     All other components within normal limits  URINE MICROSCOPIC-ADD ON - Abnormal; Notable for the following:    Squamous Epithelial / LPF FEW (*)     All other components within normal limits  CBC WITH DIFFERENTIAL  ETHANOL  URINE RAPID DRUG SCREEN (HOSP PERFORMED)  LAB REPORT - SCANNED   No results found.   1. DEPRESSION       MDM  Patient understand and agree with initial ED impression and plan with expectations set for ED visit.        Hurman Horn, MD 04/29/12 [phone]

## 2012-04-23 NOTE — ED Notes (Signed)
Telepsych in progress. 

## 2012-04-23 NOTE — ED Notes (Signed)
She has had mood swings she cries she is paranoid she was on zoloft and she stopped taking it for 2-3 weeks then started taking it again.  She has anger episodes and crying. All for the past months

## 2012-04-23 NOTE — ED Notes (Signed)
Faxed [facility] paperwork

## 2012-04-24 ENCOUNTER — Encounter (HOSPITAL_COMMUNITY): Payer: Self-pay

## 2012-04-24 NOTE — ED Notes (Signed)
Pt. Has a red area on her lt. Cheek, Pt. Denies any itching. No swelling noted.  Also her upper chest is slightly red,.  Pt. Denies any itching, no sob noted, no hives. Noted.  Will continue to monitor

## 2012-04-24 NOTE — BH Assessment (Signed)
[facility] Assessment Progress Note      Pt referral sent to [facility] and Bonita Quin called Clinical research associate from [facility] stating pt was accepted, but wanted to ask family if facility was too far away.  Writer spoke with pt and pt's son who were present, who stated that was too far away and requesting pt go to [facility].  Pt is accepted [facility] pending a bed.  Other facilities were contacted including [facility], [place], [place], [place] and no beds available.

## 2012-04-24 NOTE — ED Provider Notes (Signed)
  Physical Exam  BP 130/81  Pulse 78  Temp 98.1 F (36.7 C) (Oral)  Resp 18  SpO2 100%  Physical Exam  ED Course  Procedures  MDM The patient and accepted at behavioral health by Dr. Allena Katz.      Juliet Rude. Rubin Payor, MD 04/24/12 2316

## 2012-04-24 NOTE — BH Assessment (Signed)
Assessment Note   Brittany Sweeney is an 51 y.o. female.  Brittany Sweeney was brought to [facility] by her husband and son.  [name] had been on zoloft for a few months when she stopped taking it about 4-5 months ago.  Four months ago she quit her work because she thought that a co-worker was talking about her.  She has had increasing depression and anxiety.  She denies current SI but had made a statement to husband that she wanted to die.  She had also told one of the pastors at church this same thing on 06/30.  Brittany Sweeney has denied HI consistently.  She does say that she thinks that people are talking about her when she is out in public, won't go to restaurants because they are poisoning her and family's food.  Brittany Sweeney had also thought she heard someone in the crawlspace at home and heard a baby crying in the house a few weeks ago.  Her paranoia has gotten to the point that she did not want her sister to visit the home.  Brittany Sweeney had stopped the zoloft initially because "it made me feel fuzzy headed."  She followed by Dr. Ronne Sweeney in [facility] and does not have a appt until end of August.  She went back on the Zoloft in the last few days.  She is currently in need of inpatient psychiatric care according to telepsychiatrist Dr. Reece Sweeney.  Please review for admission. Axis I: Anxiety Disorder NOS and Major Depression, single episode Axis II: Deferred Axis III:  Past Medical History  Diagnosis Date  . Cardiomyopathy   . Fibromyalgia   . Hypertension   . Depression    Axis IV: occupational problems, other psychosocial or environmental problems and problems related to social environment Axis V: 31-40 impairment in reality testing  Past Medical History:  Past Medical History  Diagnosis Date  . Cardiomyopathy   . Fibromyalgia   . Hypertension   . Depression     History reviewed. No pertinent past surgical history.  Family History: No family history on file.  Social History:  reports that she has never smoked.  She has never used smokeless tobacco. She reports that she does not drink alcohol or use illicit drugs.  Additional Social History:  Alcohol / Drug Use Pain Medications: None Prescriptions: Pt did not have dosages on medications available but takes Zoloft, Welbutrin, Correg, Topomax Over the Counter: N/A History of alcohol / drug use?: No history of alcohol / drug abuse  CIWA: CIWA-Ar BP: 103/66 mmHg Pulse Rate: 63  COWS:    Allergies:  Allergies  Allergen Reactions  . Sulfonamide Derivatives     REACTION: swelling and hives    Home Medications:  (Not in a hospital admission)  OB/GYN Status:  No LMP recorded. Patient has had a hysterectomy.  General Assessment Data Location of Assessment: [facility] ED Living Arrangements: Spouse/significant other Can pt return to current living arrangement?: Yes Admission Status: Voluntary Is patient capable of signing voluntary admission?: Yes Transfer from: Acute Hospital Referral Source: Self/Family/Friend     Risk to self Suicidal Ideation: Yes-Currently Present Suicidal Intent: No Is patient at risk for suicide?: Yes Suicidal Plan?: No Access to Means: No What has been your use of drugs/alcohol within the last 12 months?: Pt denies Previous Attempts/Gestures: No How many times?: 0  Other Self Harm Risks: None Triggers for Past Attempts: None known Intentional Self Injurious Behavior: None Family Suicide History: No Recent stressful life event(s): Conflict (Comment);Job Loss (Imagines others  are out to get her & family) Persecutory voices/beliefs?: Yes Depression: Yes Depression Symptoms: Despondent;Isolating;Loss of interest in usual pleasures;Feeling worthless/self pity Substance abuse history and/or treatment for substance abuse?: No Suicide prevention information given to non-admitted patients: Not applicable  Risk to Others Homicidal Ideation: No Thoughts of Harm to Others: No Current Homicidal Intent: No Current  Homicidal Plan: No Access to Homicidal Means: No Identified Victim: No one History of harm to others?: No Assessment of Violence: None Noted Violent Behavior Description: N/A Does patient have access to weapons?: No Criminal Charges Pending?: No Does patient have a court date: No  Psychosis Hallucinations: Auditory (Hearing a baby cry in house, sounds under house) Delusions: Persecutory  Mental Status Report Appear/Hygiene:  (Casual) Eye Contact: Poor Motor Activity: Unremarkable;Freedom of movement Speech: Soft;Logical/coherent Level of Consciousness: Quiet/awake Mood: Depressed;Anxious Affect: Blunted;Depressed;Sad Anxiety Level: Moderate Thought Processes: Coherent;Relevant Judgement: Impaired Orientation: Person;Place;Time;Situation Obsessive Compulsive Thoughts/Behaviors: None  Cognitive Functioning Concentration: Decreased Memory: Recent Impaired;Remote Intact IQ: Average Insight: Fair Impulse Control: Poor Appetite: Poor Weight Loss: 0  Weight Gain: 0  Sleep: No Change Total Hours of Sleep: 8  Vegetative Symptoms: None  ADLScreening [facility]) Patient's cognitive ability adequate to safely complete daily activities?: Yes Patient able to express need for assistance with ADLs?: Yes Independently performs ADLs?: Yes  Abuse/Neglect [facility]) Physical Abuse: Denies Verbal Abuse: Denies Sexual Abuse: Denies  Prior Inpatient Therapy Prior Inpatient Therapy: No Prior Therapy Dates: N/A Prior Therapy Facilty/Provider(s): None Reason for Treatment: None  Prior Outpatient Therapy Prior Outpatient Therapy: Yes Prior Therapy Dates: Current Prior Therapy Facilty/Provider(s):  Dr. Ronne Sweeney in [place] Reason for Treatment: MH  ADL Screening (condition at time of admission) Patient's cognitive ability adequate to safely complete daily activities?: Yes Patient able to express need for assistance with ADLs?: Yes Independently performs ADLs?:  Yes Weakness of Legs: None Weakness of Arms/Hands: None  Home Assistive Devices/Equipment Home Assistive Devices/Equipment: None    Abuse/Neglect Assessment (Assessment to be complete while patient is alone) Physical Abuse: Denies Verbal Abuse: Denies Sexual Abuse: Denies Exploitation of patient/patient's resources: Denies Self-Neglect: Denies     Merchant navy officer (For Healthcare) Advance Directive: Patient does not have advance directive;Patient would not like information    Additional Information 1:1 In Past 12 Months?: No CIRT Risk: No Elopement Risk: No Does patient have medical clearance?: Yes     Disposition:  Disposition Disposition of Patient: Inpatient treatment program Type of inpatient treatment program: Adult (Referred to [facility])  On Site Evaluation by:   Reviewed with Physician:  Dr. Patriciaann Clan, Claris Gladden 04/24/2012 6:56 AM

## 2012-04-25 ENCOUNTER — Encounter (HOSPITAL_COMMUNITY): Payer: Self-pay | Admitting: *Deleted

## 2012-04-25 ENCOUNTER — Inpatient Hospital Stay (HOSPITAL_COMMUNITY)
Admission: AD | Admit: 2012-04-25 | Discharge: 2012-05-01 | DRG: 430 | Disposition: A | Discharge status: Home / Self Care | Payer: BC Managed Care – PPO | Source: Ambulatory Visit | Attending: Psychiatry | Admitting: Psychiatry

## 2012-04-25 DIAGNOSIS — F329 Major depressive disorder, single episode, unspecified: Secondary | ICD-10-CM | POA: Diagnosis present

## 2012-04-25 DIAGNOSIS — I428 Other cardiomyopathies: Secondary | ICD-10-CM | POA: Diagnosis present

## 2012-04-25 DIAGNOSIS — F200 Paranoid schizophrenia: Principal | ICD-10-CM | POA: Diagnosis present

## 2012-04-25 DIAGNOSIS — IMO0001 Reserved for inherently not codable concepts without codable children: Secondary | ICD-10-CM | POA: Diagnosis present

## 2012-04-25 DIAGNOSIS — I10 Essential (primary) hypertension: Secondary | ICD-10-CM | POA: Diagnosis present

## 2012-04-25 DIAGNOSIS — F3289 Other specified depressive episodes: Secondary | ICD-10-CM | POA: Diagnosis present

## 2012-04-25 DIAGNOSIS — R21 Rash and other nonspecific skin eruption: Secondary | ICD-10-CM | POA: Diagnosis present

## 2012-04-25 MED ORDER — ALUM & MAG HYDROXIDE-SIMETH 200-200-20 MG/5ML PO SUSP: 30.0000 mL | ORAL | Status: DC | PRN

## 2012-04-25 MED ORDER — POTASSIUM CHLORIDE CRYS ER 20 MEQ PO TBCR
20.0000 meq | EXTENDED_RELEASE_TABLET | ORAL | Status: AC
  Administered 2012-04-25 – 2012-04-28 (×6): 20 meq via ORAL
  Filled 2012-04-25 (×7): qty 1

## 2012-04-25 MED ORDER — SERTRALINE HCL 50 MG PO TABS
50.0000 mg | ORAL_TABLET | Freq: Every day | ORAL | Status: DC
  Administered 2012-04-25 – 2012-04-27 (×3): 50 mg via ORAL
  Filled 2012-04-25 (×5): qty 1

## 2012-04-25 MED ORDER — ACETAMINOPHEN 325 MG PO TABS: 650.0000 mg | ORAL_TABLET | Freq: Four times a day (QID) | ORAL | Status: DC | PRN

## 2012-04-25 MED ORDER — BUPROPION HCL ER (XL) 300 MG PO TB24
300.0000 mg | ORAL_TABLET | Freq: Every day | ORAL | Status: DC
  Administered 2012-04-25 – 2012-05-01 (×7): 300 mg via ORAL
  Filled 2012-04-25 (×10): qty 1
  Filled 2012-04-25: qty 14

## 2012-04-25 MED ORDER — TOPIRAMATE 100 MG PO TABS
100.0000 mg | ORAL_TABLET | Freq: Every day | ORAL | Status: DC
  Administered 2012-04-25 – 2012-04-30 (×6): 100 mg via ORAL
  Filled 2012-04-25 (×3): qty 1
  Filled 2012-04-25: qty 14
  Filled 2012-04-25 (×4): qty 1

## 2012-04-25 MED ORDER — RISPERIDONE 2 MG PO TABS
2.0000 mg | ORAL_TABLET | Freq: Every day | ORAL | Status: DC
  Filled 2012-04-25 (×2): qty 1

## 2012-04-25 MED ORDER — CARVEDILOL 6.25 MG PO TABS
6.2500 mg | ORAL_TABLET | Freq: Two times a day (BID) | ORAL | Status: DC
  Administered 2012-04-25 – 2012-05-01 (×13): 6.25 mg via ORAL
  Filled 2012-04-25 (×5): qty 1
  Filled 2012-04-25: qty 28
  Filled 2012-04-25 (×4): qty 1
  Filled 2012-04-25: qty 28
  Filled 2012-04-25 (×6): qty 1

## 2012-04-25 MED ORDER — MAGNESIUM HYDROXIDE 400 MG/5ML PO SUSP
30.0000 mL | Freq: Every day | ORAL | Status: DC | PRN
  Administered 2012-04-30: 30 mL via ORAL

## 2012-04-25 MED ORDER — RISPERIDONE 3 MG PO TABS
3.0000 mg | ORAL_TABLET | Freq: Every day | ORAL | Status: DC
  Administered 2012-04-25 – 2012-04-26 (×2): 3 mg via ORAL
  Filled 2012-04-25 (×4): qty 1

## 2012-04-25 NOTE — ED Notes (Signed)
REPORT GIVEN TO JANE RN AT [facility] , SECURITY PAGED TO TRANSPORT PT.

## 2012-04-25 NOTE — Progress Notes (Signed)
Psychoeducational Group Note  Date:  04/25/2012 Time:  2108  Group Topic/Focus:  Wrap-Up Group:   The focus of this group is to help patients review their daily goal of treatment and discuss progress on daily workbooks.  Participation Level:  Active  Participation Quality:  Appropriate  Affect:  Appropriate  Cognitive:  Alert  Insight:  Good  Engagement in Group:  Good  Additional Comments:  Pt interacted well during group.  Kaleen Odea R 04/25/2012, 9:10 PM

## 2012-04-25 NOTE — Tx Team (Signed)
Initial Interdisciplinary Treatment Plan  PATIENT STRENGTHS: (choose at least two) Average or above average intelligence Capable of independent living General fund of knowledge Religious Affiliation Supportive family/friends  PATIENT STRESSORS: Financial difficulties Marital or family conflict Medication change or noncompliance Occupational concerns   PROBLEM LIST: Problem List/Patient Goals Date to be addressed Date deferred Reason deferred Estimated date of resolution  Depression      Non-compliant with meds      Anxiety      Paranoia-thought co-worker was talking about her, so she quit her job.        Risk for self harm-thoughts of just wanting to die                               DISCHARGE CRITERIA:  Improved stabilization in mood, thinking, and/or behavior Motivation to continue treatment in a less acute level of care Reduction of life-threatening or endangering symptoms to within safe limits Verbal commitment to aftercare and medication compliance  PRELIMINARY DISCHARGE PLAN: Attend aftercare/continuing care group Outpatient therapy Participate in family therapy Return to previous living arrangement  PATIENT/FAMIILY INVOLVEMENT: This treatment plan has been presented to and reviewed with the patient, Brittany Sweeney, and/or family member.  The patient and family have been given the opportunity to ask questions and make suggestions.  [facility] 04/25/2012, 3:18 AM

## 2012-04-25 NOTE — Progress Notes (Addendum)
D: Pt denies SI and denies auditory or visual hallucinations; pt is having some paranoia about the events that led to her admission; pt is cooperative with staff; pt has anxious mood and affect; pt does complain of a little anxiety but appears to be coping well A: Pt given emotional support from RN; pt encouraged to come to staff with any concerns or questions; pt's medication routine continued R: Pt remains appropriate and cooperative; will encourage pt to attend groups and participate in activities on unit; will continue to monitor pt for safety

## 2012-04-25 NOTE — Progress Notes (Signed)
D: Sitting on bed on approach. Appears flat and depressed. Anxious but cooperative with assessment. No acute distress noted. States she has had a good day. Feels like she is adjusting to milieu without issue. Does endorse some slight, non-specific feelings of paranoia. States she was visited by family today and she enjoyed that visit. Also states she feels like the programming will be helpful for her. Denies SI/HI/AVH and contracts for safety.  A: Support and encouragement provided. Safety has been maintained with Q15 minute observation. Meds given as ordered by MD. POC and medications for the shift reviewed and understanding verbalized.  R: Pt remains safe. Is compliant with treatment goals. Pleasant and cooperative on unit. Will continue current POC.

## 2012-04-25 NOTE — H&P (Signed)
Psychiatric Admission Assessment Adult  Patient Identification:  Brittany Sweeney Date of Evaluation:  04/25/2012 Chief Complaint:  MDD, Single Episode, Severe; Anxiety Disorder NOS History of Present Illness: This is a voluntary admission for  Brittany Sweeney who was brought to the ED by her husband and son reporting increasing symptoms of depression with some paranoid delusions since she quit her job due to people talking about her.  She reports she quit her zoloft about a month ago and has decompensated significantly to the point of being afraid to go eat in a [employer]. She fears that people are trying to poison her, she also fears that people are talking about her when she is out in public. Mood Symptoms:  Depression, Helplessness, Hopelessness, Sadness, Depression Symptoms:  depressed mood, insomnia, psychomotor agitation, fatigue, difficulty concentrating, loss of energy/fatigue, (Hypo) Manic Symptoms:  Delusions, Anxiety Symptoms:  Excessive Worry, Psychotic Symptoms:  Delusions, of paranoia.  See HPI.  PTSD Symptoms:   Past Psychiatric History: Diagnosis:  Depression  Hospitalizations: none  Outpatient Care:  Dr. Rosalia Hammers, Dr. Geanie Cooley  At [facility]  Substance Abuse Care:  Self-Mutilation:  Suicidal Attempts: no  Violent Behaviors:   Past Medical History:   Past Medical History  Diagnosis Date  . Cardiomyopathy   . Fibromyalgia   . Hypertension   . Depression    Cardiac History:  cardiomyapathy Allergies:   Allergies  Allergen Reactions  . Sulfonamide Derivatives     REACTION: swelling and hives   PTA Medications: Prescriptions prior to admission  Medication Sig Dispense Refill  . buPROPion (WELLBUTRIN XL) 150 MG 24 hr tablet Take 150 mg by mouth 2 (two) times daily.       . carvedilol (COREG) 12.5 MG tablet Take 6.25 mg by mouth 2 (two) times daily with a meal.      . sertraline (ZOLOFT) 50 MG tablet Take 50 mg by mouth daily.       [name] topiramate (TOPAMAX) 100 MG  tablet Take 100 mg by mouth daily. Take 1 and 1/2 at bedtime      . zolpidem (AMBIEN) 10 MG tablet Take 10 mg by mouth at bedtime as needed. For sleep        Previous Psychotropic Medications:  Medication/Dose                 Substance Abuse History in the last 12 months: Substance Age of 1st Use Last Use Amount Specific Type  Nicotine      Alcohol      Cannabis      Opiates      Cocaine      Methamphetamines      LSD      Ecstasy      Benzodiazepines      Caffeine      Inhalants      Others:                         Consequences of Substance Abuse:   Social History: Current Place of Residence:   Place of Birth:   Family Members: Marital Status:  Married Children:  Sons:  Daughters: Relationships: Education:  HS Print production planner Problems/Performance: Religious Beliefs/Practices: History of Abuse (Emotional/Phsycial/Sexual) Occupational Experiences; Military History:  None. Legal History: Hobbies/Interests:  Family History:  History reviewed. No pertinent family history. ROS: Negative with the exception of HPI. PE: Completed in the ED by the MD. I have reviewed the patient and agree with those findings. Mental Status  Examination/Evaluation: Objective:  Appearance: Casual  Eye Contact::  Poor  Speech:  Normal Rate  Volume:  Decreased  Mood:  Depressed  Affect:  Congruent, Labile and Tearful  Thought Process:  Loose  Orientation:  Full  Thought Content:  WDL  Suicidal Thoughts:  No  Homicidal Thoughts:  No  Memory:  Immediate;   Poor  Judgement:  Impaired  Insight:  Lacking  Psychomotor Activity:  Decreased and Restlessness  Concentration:  Fair  Recall:  Poor  Akathisia:  No  Handed:  Right  AIMS (if indicated):     Assets:  Desire for Improvement Financial Resources/Insurance Housing Physical Health Social Support Talents/Skills Transportation  Sleep:  Number of Hours: 2     Laboratory/X-Ray Psychological Evaluation(s)   UDS -  negative  CMP- negative    Assessment:    AXIS I:   Schizophrenia, paranoid type AXIS II:  Deferred AXIS III:   Past Medical History  Diagnosis Date  . Cardiomyopathy   . Fibromyalgia   . Hypertension   . Depression   AXIS IV:  occupational problems AXIS V:  51-60 moderate symptoms  Treatment Plan/Recommendations:  Treatment Plan Summary: Daily contact with patient to assess and evaluate symptoms and progress in treatment Medication management Treatment Plan Summary:  1. Daily contact with patient to assess and evaluate symptoms and progress in treatment.  2. Medication management  3. The patient will deny suicidal ideations or homicidal ideations for 48 hours prior to discharge and have a depression and anxiety rating of 3 or less. The patient will also deny any auditory or visual hallucinations or delusional thinking.  4. The patient will deny any symptoms of substance withdrawal at time of discharge.  Current Medications:  Current Facility-Administered Medications  Medication Dose Route Frequency Provider Last Rate Last Dose  . acetaminophen (TYLENOL) tablet 650 mg  650 mg Oral Q6H PRN Curlene Labrum Readling, MD      . alum & mag hydroxide-simeth (MAALOX/MYLANTA) 200-200-20 MG/5ML suspension 30 mL  30 mL Oral Q4H PRN Curlene Labrum Readling, MD      . buPROPion (WELLBUTRIN XL) 24 hr tablet 300 mg  300 mg Oral Daily Curlene Labrum Readling, MD   300 mg at 04/25/12 0754  . carvedilol (COREG) tablet 6.25 mg  6.25 mg Oral BID WC Curlene Labrum Readling, MD   6.25 mg at 04/25/12 0808  . magnesium hydroxide (MILK OF MAGNESIA) suspension 30 mL  30 mL Oral Daily PRN Curlene Labrum Readling, MD      . risperiDONE (RISPERDAL) tablet 2 mg  2 mg Oral QHS Randy D Readling, MD      . sertraline (ZOLOFT) tablet 50 mg  50 mg Oral Q breakfast Curlene Labrum Readling, MD   50 mg at 04/25/12 0754  . topiramate (TOPAMAX) tablet 100 mg  100 mg Oral QHS Ronny Bacon, MD       Facility-Administered Medications Ordered in Other  Encounters  Medication Dose Route Frequency Provider Last Rate Last Dose  . DISCONTD: buPROPion (WELLBUTRIN XL) 24 hr tablet 150 mg  150 mg Oral BID Hurman Horn, MD   150 mg at 04/24/12 2148  . DISCONTD: carvedilol (COREG) tablet 6.25 mg  6.25 mg Oral BID WC Hurman Horn, MD   6.25 mg at 04/24/12 1631  . DISCONTD: LORazepam (ATIVAN) tablet 1 mg  1 mg Oral Q8H PRN Hurman Horn, MD   1 mg at 04/24/12 2148  . DISCONTD: sertraline (ZOLOFT) tablet 50 mg  50 mg Oral Daily Hurman Horn, MD   50 mg at 04/24/12 7846  . DISCONTD: topiramate (TOPAMAX) tablet 100 mg  100 mg Oral QHS Hurman Horn, MD   100 mg at 04/24/12 2148  . DISCONTD: ziprasidone (GEODON) capsule 20 mg  20 mg Oral BID WC Hurman Horn, MD   20 mg at 04/24/12 1631    Observation Level/Precautions:  routine  Laboratory:    Psychotherapy:    Medications:    Routine PRN Medications:  Yes  Consultations:    Discharge Concerns:    Other:     Lloyd Huger T. Emmry Hinsch PAC For Dr. Harvie Heck D. Readling 7/3/20133:13 PM

## 2012-04-25 NOTE — Discharge Planning (Signed)
Brittany Sweeney  04/25/2012  SW met with pt. In discharge planning group.  Pt. described her husband and son as supports.  Pt. Provided feedback that Dr. Rosalia Hammers at [facility] is her current provider and she recently began her treatment.  Pt. Appeared to be alert and have a positive outlook for her personal development.  SW discussed suicide prevention.  Pt. Processed with group facilitator regarding suicide prevention.  Consent to receive information from the pt. husband has been signed.  SW will continue to monitor and assess for referrals.  Met with pt. In treatment team group.  Pt. Stated first noticing a change in her mood when she tapered off her Zoloft.  Pt. stated the last date she worked was on the 6th of last month.  Pt. Stated she was on "vacation".  Pt. Seems anxious over a situation that may have taken place that people at work were talking about and possibly trying to "set her up".  Pt. Stated she may be "mixed up in her mind though" about the situation.  SW/counselor will continue to assess for clarity as to what happened that may have triggered pt.  Clarice Pole, LCASA 04/25/2012, 9:59 AM

## 2012-04-25 NOTE — [facility] Suicide Risk Assessment (Signed)
Suicide Risk Assessment  Admission Assessment     Demographic factors:  Assessment Details Time of Assessment: Admission Information Obtained From: Patient Current Mental Status:  Current Mental Status:  (Pt denies) Loss Factors:  Loss Factors: Decrease in vocational status Historical Factors:  Historical Factors:  (none) Risk Reduction Factors:  Risk Reduction Factors: Sense of responsibility to family;Employed;Living with another person, especially a relative;Positive social support  CLINICAL FACTORS:   Depression:   Anhedonia Schizophrenia:   Paranoid or undifferentiated type Chronic Pain Currently Psychotic Previous Psychiatric Diagnoses and Treatments Medical Diagnoses and Treatments/Surgeries  COGNITIVE FEATURES THAT CONTRIBUTE TO RISK:  None Noted.  Diagnosis:  Axis I:  Schizophrenia - Paranoid Type.  The patient was seen today and reports the following:   ADL's: Intact.  Sleep: The patient reports to sleeping reasonably well at night. Appetite: The patient reports her appetite is "up and down."   Mild>(1-10) >Severe  Hopelessness (1-10): 0  Depression (1-10): 3  Anxiety (1-10): 3  Suicidal Ideation: The patient denies any suicidal ideations today.  Plan: No  Intent: No  Means: No  Homicidal Ideation: The patient denies any homicidal ideations today.  Plan: No  Intent: No.  Means: No   General Appearance/Behavior: The patient was friendly and cooperative today with this provider.  Eye Contact: Good.  Speech: Appropriate in rate and volume today with no pressuring noted.  Motor Behavior: wnl.  Level of Consciousness: Alert and Oriented x 3.  Mental Status: Alert and Oriented x 3.  Mood: Appears mildly depressed today.  Affect: Appears mildly constricted.  Anxiety Level: No anxiety reported today.  Thought Process: Paranoid delusions noted.  Thought Content: The patient denies any auditory or visual hallucinations today. She however appears to be  exhibiting significant paranoid delusions as described below. Perception: Paranoid delusions noted.  Judgment: Fair.  Insight: Fair.  Cognition: Oriented to person, place and time.   Current Medications:    . buPROPion  300 mg Oral Daily  . carvedilol  6.25 mg Oral BID WC  . potassium chloride  20 mEq Oral BH-qamhs  . risperiDONE  3 mg Oral QHS  . sertraline  50 mg Oral Q breakfast  . topiramate  100 mg Oral QHS  . DISCONTD: risperiDONE  2 mg Oral QHS   Review of Systems:  Neurological: The patient denies any headaches today. She denies any seizures or dizziness.  G.I.: The patient denies any constipation or G.I. Upset today.  Musculoskeletal: The patient reports chronic pain related to fibromyalgia.   Time was spent today discussing with the patient her current symptoms. The patient states that she is sleeping reasonably well and report that her appetite is "up and down." She reports mild feelings of sadness, anhedonia and depressed mood as well as mild anxiety symptoms today.  She denies any suicidal or homicidal ideations today. She denies any auditory or visual hallucinations today as well as any delusional thinking.  The patient however states today that several of her co-workers are plotting against her and trying to "frame her."  She also states that others have "bugged my home" and are able to monitor her activities.  Treatment Plan Summary:  1. Daily contact with patient to assess and evaluate symptoms and progress in treatment.  2. Medication management  3. The patient will deny suicidal ideations or homicidal ideations for 48 hours prior to discharge and have a depression and anxiety rating of 3 or less. The patient will also deny any auditory or visual hallucinations  or delusional thinking.  4. The patient will deny any symptoms of substance withdrawal at time of discharge.   Plan:  1. Will continue the medication Zoloft at 50 mgs po q am for depression.  2. Will continue  the medication Wellbutrin XL at 300 mgs po q am to also address depressive symptoms.  3. Will continue the medication Topamax at 100 mgs po qhs for chronic pain.  4. Will start the medication Risperdal at 3 mgs po qhs for psychosis.  5. Will continue the patient on her non-psychiatric medications.  6. Laboratory studies reviewed.  7. Will start KCL at 20 mEq po q am and hs x 6 doses for hypokalemia. 8. Will order a TSH, Free T3 and Free T4. 9. Will continue to monitor.   SUICIDE RISK:   Minimal: No identifiable suicidal ideation.  Patients presenting with no risk factors but with morbid ruminations; may be classified as minimal risk based on the severity of the depressive symptoms   Brittany Sweeney 04/25/2012, 8:37 PM

## 2012-04-25 NOTE — Progress Notes (Signed)
Recreation Therapy Notes  04/25/2012         Time: 0930      Group Topic/Focus: The focus of this group is on discussing various styles of communication and communicating assertively using 'I' (feeling) statements.   Participation Level: Active  Participation Quality: Attentive  Affect: Appropriate  Cognitive: Alert   Additional Comments: Patient bright, reports that she will attend all groups because she wants to get out of here as soon as possible- patient stated this numerous times throughout group. Patient reports she knows that she wasn't communicating well and that is what led to her admission. Patient talked about wanting to be more assertive when she communicates with her husband and help him understand she needs him to listen more.   Olympia Adelsberger 04/25/2012 10:12 AM

## 2012-04-25 NOTE — Progress Notes (Signed)
51 yo MWF admitted to the [facility] hall for depression/anxiety.  Reports mood swings, crying spells, and anger episodes.  Presented to the ED, transported by her husband and son.  Stopped taking her Zoloft about 4 months ago because it made her feel "dizzy, as if her head was in a cloud".   She has been having paranoid thoughts that strangers are talking about her when she is out in public.  She stopped going to restaurants because she thought they were poisoning her and her family.  She even quit her job because she thought a co-worker was talking about her.  She reported to the ED staff that she had an episode where she thought someone was in the crawl space of her home.  She also had another time when she heard a baby crying in the home.  She denies any past abuse.  She says she is dealing with some issues at home and work, but would not be specific.  She was drowsy during the admission, but ED staff had given her an Ambien before transfer.  Medical px include fibromyalgia, HTN, cardiomyopathy.  Pt denies SI/HI now, but recently told her husband and pastor that she just wanted to die.  Pt was pleasant/cooperative with admission process.  Pt oriented to unit/room.  Safety checks q15 minutes initiated.

## 2012-04-25 NOTE — Progress Notes (Signed)
[facility] Group Notes:  (Counselor/Nursing/MHT/Case Management/Adjunct)  04/25/2012 2:23 PM  Type of Therapy:  Psychoeducational Skills  Participation Level:  Active  Participation Quality:  Attentive  Affect:  Appropriate  Cognitive:  Oriented  Insight:  Good  Engagement in Group:  Good  Engagement in Therapy:  Good  Modes of Intervention:  Education and Support  Summary of Progress/Problems: Patient came to group late. Stated that she did not know that group was meeting. Asked for a schedule. She was attentive to speaker from mental health association and thought speaker looked familiar, even reminding her of her son.   Brittany Sweeney, [name] 04/25/2012, 2:23 PM

## 2012-04-25 NOTE — Treatment Plan (Addendum)
Interdisciplinary Treatment Plan Update (Adult) Date: 04/25/2012  Time Reviewed: 11:23 AM   Progress in Treatment: Attending groups: Yes Participating in groups: Yes, pt. was an active participant in discharge planning group Taking medication as prescribed: Yes Tolerating medication: Yes Family/Significant othe contact made: No, needed with husband and has signed consent Patient understands diagnosis: Yes Discussing patient identified problems/goals with staff: Yes, in treatment team Medical problems stabilized or resolved: Yes Denies suicidal/homicidal ideation: Yes, in treatment team  Issues/concerns per patient self-inventory: None identified Other: N/A  New problem(s) identified: None Identified  Reason for Continuation of Hospitalization: Anxiety Depression Medication stabilization Suicidal ideation  Interventions implemented related to continuation of hospitalization: mood stabilization, medication monitoring and adjustment, group therapy and psycho education, safety checks q 15 mins  Additional comments: N/A  Estimated length of stay: 3-5 days  Discharge Plan: SW is assessing for appropriate referrals.   New goal(s):  Review of initial/current patient goals per problem list:  1. Goal(s): Reduce symptoms of depression  Met: No  Target date: by discharge  As evidenced by: Reducing depression to a 3 or less on a scale of 1 to 10. (today is "2-3" for depression, but patient has a hard time answering)  2. Goal (s): Eliminate Suicidal Ideation or thou)ghts of wanting to die  Met: No  Target date: by discharge  As evidenced by: Self report  (patient admits to feeling SI prior to admission, reports none today)  3. Goal(s): Follow-up appointment  Met: No  Target date: by discharge  As evidenced by: Confirmation of appointment with provider (patient is new, still needed)  4. Goal(s):  Reduce anxiety to no more than 3 at discharge.  Met: No  Target date: by  discharge  As evidenced by:  Feels comfortable talking to team, which she states is unusual for her, unable to list a number.  5.  Goal:  Reduce paranoid thoughts to baseline per patient's husband.  Met:  No  Target Date:  By Discharge  As evidenced by:  Remains quite paranoid at this time, although she does trust the treatment team  Attendees: Patient: Brittany Sweeney  04/25/2012 11:21 AM   Family:    Physician: Franchot Gallo, MD 04/25/2012 11:22 AM   Nursing: Leotis Pain, RN 04/25/2012 11:22 AM   Case Manager: Ambrose Mantle, LCSW 04/25/2012 11:22 AM   Counselor: Veto Kemps, MT-BC 04/25/2012 11:22 AM   Other: Clarice Pole, LCASA 04/25/2012 11:22 AM   Other: Nestor Ramp, RN 04/25/2012 11:22 AM   Other:    Other:    Scribe for Treatment Team: Clarice Pole, LCASA Clarice Pole, LCASA 04/25/2012 11:23 AM

## 2012-04-25 NOTE — Progress Notes (Signed)
Psychoeducational Group Note  Date:  04/25/2012 Time:  1100  Group Topic/Focus:  Personal Choices and Values:   The focus of this group is to help patients assess and explore the importance of values in their lives, how their values affect their decisions, how they express their values and what opposes their expression.  Participation Level:  Did Not Attend  Participation Quality:  Appropriate  Affect:  Appropriate  Cognitive:  Appropriate  Insight:  None  Engagement in Group:  Limited  Additional Comments:  Pt attended the start of group but was called out for treatment team  Edwinna Areola A 04/25/2012, 6:49 PM

## 2012-04-26 NOTE — Progress Notes (Signed)
Currently resting quietly in bed with eyes closed. Respirations are even and unlabored. No acute distress noted. Safety has been maintained with Q15 minute observation. Will continue Q15 minute observation and continue current POC. 

## 2012-04-26 NOTE — Progress Notes (Signed)
D. Pt. Denies SI/HI and denies A/V hallucinations.  Attended Karaoke this evening.  Compliant with medications.  A.  Pt.  Monitored for safety.  R. Pt. Remains safe.  Support given.

## 2012-04-26 NOTE — Discharge Planning (Signed)
Brittany Sweeney gave me a number for her husband-[phone].  Left msg for Husband.  Will follow up at [facility] in [place].

## 2012-04-26 NOTE — Progress Notes (Signed)
D: Pt denies SI and auditory/visual hallucinations; pt previously reported some paranoia but is currently denying any; pt has depressed mood and an anxious affect; pt is appropriate and cooperative with staff and is attending groups and participating regularly  A: Pt given emotional support from RN; pt encouraged to come to staff with questions or concerns; pt's medication routine continued R: Pt remains appropriate and cooperative; will continue to monitor

## 2012-04-26 NOTE — Progress Notes (Signed)
[facility] MD Progress Note  04/26/2012 3:23 PM  Diagnosis:  Axis I: Schizophrenia - Paranoid Type.   The patient was seen today and reports the following:   ADL's: Intact.  Sleep: The patient reports to having difficulty maintaining sleep last night.  Appetite: The patient reports her appetite is down but improving.   Mild>(1-10) >Severe  Hopelessness (1-10): 0  Depression (1-10): 3  Anxiety (1-10): 2   Suicidal Ideation: The patient denies any suicidal ideations today.  Plan: No  Intent: No  Means: No   Homicidal Ideation: The patient denies any homicidal ideations today.  Plan: No  Intent: No.  Means: No   General Appearance/Behavior: The patient remained friendly and cooperative today with this provider.  Eye Contact: Good.  Speech: Appropriate in rate and volume today with no pressuring noted.  Motor Behavior: wnl.  Level of Consciousness: Alert and Oriented x 3.  Mental Status: Alert and Oriented x 3.  Mood: Appears mildly depressed today.  Affect: Appears mildly constricted.  Anxiety Level: Mild anxiety reported today.  Thought Process: Paranoid delusions continue but improving.  Thought Content: The patient denies any auditory or visual hallucinations today. She however appears to be exhibiting paranoid delusions which are improving. Perception: Paranoid delusions continue but improving.  Judgment: Fair.  Insight: Fair.  Cognition: Oriented to person, place and time.  Sleep:  Number of Hours: 6.25    Vital Signs:Blood pressure 111/77, pulse 85, temperature 97.7 F (36.5 C), temperature source Oral, resp. rate 16, height 5\' 5"  (1.651 m), weight 83.462 kg (184 lb).  Current Medications: Current Facility-Administered Medications  Medication Dose Route Frequency Provider Last Rate Last Dose  . acetaminophen (TYLENOL) tablet 650 mg  650 mg Oral Q6H PRN Curlene Labrum Malyssa Maris, MD      . alum & mag hydroxide-simeth (MAALOX/MYLANTA) 200-200-20 MG/5ML suspension 30 mL  30 mL Oral  Q4H PRN Curlene Labrum Rasheen Bells, MD      . buPROPion (WELLBUTRIN XL) 24 hr tablet 300 mg  300 mg Oral Daily Curlene Labrum Nyilah Kight, MD   300 mg at 04/26/12 0758  . carvedilol (COREG) tablet 6.25 mg  6.25 mg Oral BID WC Curlene Labrum Jamani Bearce, MD   6.25 mg at 04/26/12 0758  . magnesium hydroxide (MILK OF MAGNESIA) suspension 30 mL  30 mL Oral Daily PRN Curlene Labrum Joani Cosma, MD      . potassium chloride SA (K-DUR,KLOR-CON) CR tablet 20 mEq  20 mEq Oral BH-qamhs Curlene Labrum Julita Ozbun, MD   20 mEq at 04/26/12 0758  . risperiDONE (RISPERDAL) tablet 3 mg  3 mg Oral QHS Curlene Labrum Daila Elbert, MD   3 mg at 04/25/12 2132  . sertraline (ZOLOFT) tablet 50 mg  50 mg Oral Q breakfast Curlene Labrum Tenessa Marsee, MD   50 mg at 04/26/12 0758  . topiramate (TOPAMAX) tablet 100 mg  100 mg Oral QHS Curlene Labrum Aleila Syverson, MD   100 mg at 04/25/12 2132  . DISCONTD: risperiDONE (RISPERDAL) tablet 2 mg  2 mg Oral QHS Curlene Labrum Addasyn Mcbreen, MD       Lab Results: No results found for this or any previous visit (from the past 48 hour(s)).  Physical Findings: AIMS: Facial and Oral Movements Muscles of Facial Expression: None, normal Lips and Perioral Area: None, normal Jaw: None, normal Tongue: None, normal,Extremity Movements Upper (arms, wrists, hands, fingers): None, normal Lower (legs, knees, ankles, toes): None, normal, Trunk Movements Neck, shoulders, hips: None, normal, Overall Severity Severity of abnormal movements (highest score from questions above):  None, normal Incapacitation due to abnormal movements: None, normal, Dental Status Current problems with teeth and/or dentures?: No Does patient usually wear dentures?: No   Review of Systems:  Neurological: The patient denies any headaches today. She denies any seizures or dizziness.  G.I.: The patient denies any constipation or G.I. Upset today.  Musculoskeletal: The patient reports chronic pain related to fibromyalgia which is under good control today.   Time was spent today discussing with the patient  her current symptoms. The patient states that she had difficulty initiating and maintaining sleep.  She does report that her appetite is down but improving. She reports mild feelings of sadness, anhedonia and depressed mood but denies any anxiety symptoms today.  She denies any suicidal or homicidal ideations today as well as any auditory or visual hallucinations today as well as any delusional thinking.  She does appear to be delusional about her place of employment but with the intensity of these delusions improving today.  Treatment Plan Summary:  1. Daily contact with patient to assess and evaluate symptoms and progress in treatment.  2. Medication management  3. The patient will deny suicidal ideations or homicidal ideations for 48 hours prior to discharge and have a depression and anxiety rating of 3 or less. The patient will also deny any auditory or visual hallucinations or delusional thinking.  4. The patient will deny any symptoms of substance withdrawal at time of discharge.   Plan:  1. Will continue the medication Zoloft at 50 mgs po q am for depression.  2. Will continue the medication Wellbutrin XL at 300 mgs po q am to also address depressive symptoms.  3. Will continue the medication Topamax at 100 mgs po qhs for chronic pain.  4. Will continue the medication Risperdal at 3 mgs po qhs for psychosis.  5. Will continue the patient on her non-psychiatric medications.  6. Will start KCL at 20 mEq po q am and hs x 6 doses for hypokalemia.  7. Laboratory studies reviewed. 8. Will continue to monitor.    Sherika Kubicki 04/26/2012, 3:23 PM

## 2012-04-27 LAB — TSH: TSH: 3.468 u[IU]/mL (ref 0.350–4.500)

## 2012-04-27 LAB — COMPREHENSIVE METABOLIC PANEL
ALT: 14 U/L (ref 0–35)
BUN: 15 mg/dL (ref 6–23)
CO2: 21 mEq/L (ref 19–32)
Calcium: 9.8 mg/dL (ref 8.4–10.5)
GFR calc Af Amer: >90 mL/min (ref >90)
GFR calc non Af Amer: >90 mL/min (ref >90)
Glucose, Bld: 182 mg/dL — ABNORMAL HIGH (ref 70–99)
Sodium: 141 mEq/L (ref 135–145)

## 2012-04-27 MED ORDER — METHYLPREDNISOLONE SODIUM SUCC 40 MG IJ SOLR
40.0000 mg | Freq: Once | INTRAMUSCULAR | Status: AC
  Administered 2012-04-27: 40 mg via INTRAMUSCULAR
  Filled 2012-04-27: qty 1

## 2012-04-27 MED ORDER — TRIAMCINOLONE ACETONIDE 0.1 % EX OINT
TOPICAL_OINTMENT | Freq: Three times a day (TID) | CUTANEOUS | Status: DC
  Administered 2012-04-27: 1 via TOPICAL
  Administered 2012-04-28 – 2012-05-01 (×11): via TOPICAL
  Filled 2012-04-27: qty 15

## 2012-04-27 MED ORDER — SERTRALINE HCL 25 MG PO TABS
75.0000 mg | ORAL_TABLET | Freq: Every day | ORAL | Status: DC
  Administered 2012-04-28 – 2012-05-01 (×4): 75 mg via ORAL
  Filled 2012-04-27 (×4): qty 3
  Filled 2012-04-27: qty 42
  Filled 2012-04-27: qty 3

## 2012-04-27 MED ORDER — RISPERIDONE 2 MG PO TABS
4.0000 mg | ORAL_TABLET | Freq: Every day | ORAL | Status: DC
  Administered 2012-04-27 – 2012-04-30 (×4): 4 mg via ORAL
  Filled 2012-04-27 (×6): qty 2

## 2012-04-27 NOTE — Progress Notes (Signed)
D: Pt denies SI and auditory hallucinations; pt also denies any physical complaints; pt seems to have depressed mood and affect; pt complained of some slight paranoia on the 3rd and 4th (of July) but currently denies any; pt is expressing hopefulness of returning home soon A: Pt given emotional support from RN; pt encouraged to come to staff with concerns or questions; pt's medication routine continued R: Pt remains appropriate and cooperative; will continue to monitor

## 2012-04-27 NOTE — Progress Notes (Signed)
Adult Services Patient-Family Contact/Session  Attendees:  Patient's son, Apolinar Junes [phone])  Goal(s):  collateral  Safety Concerns:  none  Narrative:  Son stated that patient stopped taking her Zoloft and didn't tell anyone. He didn't know when or why she was prescribed this because he has been in [employer] for 10 years. She started spending money recklessly, knocking on his window at 4 am. She started talking about a conspiracy plan that everything would be taken away from her that was important to her. She wouldn't even look out the window because she thought the cars going by were coming to get her. She had in her mind that son was a drug dealer and that he was in trouble. He stated that she has been hysterical over him, often flipping out if he didn't get back with her in a short period. He talked about her laying in bed for a month and crying. He described her as having 'fits' which included her sitting on the bed, rubbing her hands, stomping her feet and rubbing the bed sheet in her hands and grasping the bed. She also has been talking about crazy thoughts and about the "good Shelia" and the bad Shelia". He could not identify any particular triggers to behavior. He stated that she has been working at her job for 20 years and is on [employer]. This runs out on Monday, and he requested help with getting this extended. He stated that she did have an affair with someone on the job. There could be some truth to people talking about her. He stated he used to work there too and it's like high school where everybody knows your business. This was one of 3 affairs that patient has had. He thinks she has some guilt and shame.    Barrier(s):  None   Interventions:  Collateral, support, discharge planning  Recommendation(s):  Continued inpatient treatment for medication stabilization, out patient follow up  Follow-up Required:  No  Explanation:    Veto Kemps 04/27/2012, 3:58 PM

## 2012-04-27 NOTE — Progress Notes (Signed)
Patient ID: Brittany Sweeney, female   DOB: 1961-07-29, 50 y.o.   MRN: [id number]   Subjective:  Brittany Sweeney reports a rash to her face and neck that has worsened over the last  2 days.  She notes that it itches.  Her only new exposure is to the soap here in the hospital. She states she had the rash prior to coming to the hospital.  Objective: Rash is red and raised to the patient's left cheek and nasolabial fold, neck and chest.  No vesicles. The area is confluent. There are no excoriations.  Assessment: rhus dermatitis                       Contact dermatitis                       Allergic dermatitis  Plan: IM steroid injection, topical cream. Lloyd Huger T. Devontaye Ground PAC

## 2012-04-27 NOTE — Progress Notes (Signed)
[facility] MD Progress Note  04/27/2012 3:11 PM  Diagnosis:  Axis I: Schizophrenia - Paranoid Type.   The patient was seen today and reports the following:   ADL's: Intact.  Sleep: The patient reports to sleeping well last night.  Appetite: The patient reports that her appetite is good today.   Mild>(1-10) >Severe  Hopelessness (1-10): 0  Depression (1-10): 3  Anxiety (1-10): 2-3   Suicidal Ideation: The patient denies any suicidal ideations today.  Plan: No  Intent: No  Means: No   Homicidal Ideation: The patient denies any homicidal ideations today.  Plan: No  Intent: No.  Means: No   General Appearance/Behavior: The patient remains friendly and cooperative today with this provider.  Eye Contact: Good.  Speech: Appropriate in rate and volume today with no pressuring noted.  Motor Behavior: wnl.  Level of Consciousness: Alert and Oriented x 3.  Mental Status: Alert and Oriented x 3.  Mood: Appears mildly depressed today.  Affect: Appears mildly constricted.  Anxiety Level: No anxiety reported today.  Thought Process: Paranoid delusions continue but much improved.  Thought Content: The patient denies any auditory or visual hallucinations today. She however appears to be exhibiting paranoid delusions which are much improved.  Perception: Paranoid delusions continue but improving.  Judgment: Fair.  Insight: Fair.  Cognition: Oriented to person, place and time.  Sleep:  Number of Hours: 6.75    Vital Signs:Blood pressure 99/71, pulse 108, temperature 97.7 F (36.5 C), temperature source Oral, resp. rate 16, height 5\' 5"  (1.651 m), weight 83.462 kg (184 lb).  Current Medications: Current Facility-Administered Medications  Medication Dose Route Frequency Provider Last Rate Last Dose  . acetaminophen (TYLENOL) tablet 650 mg  650 mg Oral Q6H PRN Curlene Labrum Kavina Cantave, MD      . alum & mag hydroxide-simeth (MAALOX/MYLANTA) 200-200-20 MG/5ML suspension 30 mL  30 mL Oral Q4H PRN Curlene Labrum  Shirly Bartosiewicz, MD      . buPROPion (WELLBUTRIN XL) 24 hr tablet 300 mg  300 mg Oral Daily Curlene Labrum Valdis Bevill, MD   300 mg at 04/27/12 0742  . carvedilol (COREG) tablet 6.25 mg  6.25 mg Oral BID WC Curlene Labrum Dorrian Doggett, MD   6.25 mg at 04/27/12 0742  . magnesium hydroxide (MILK OF MAGNESIA) suspension 30 mL  30 mL Oral Daily PRN Curlene Labrum Nysha Koplin, MD      . methylPREDNISolone sodium succinate (SOLU-MEDROL) 40 mg/mL injection 40 mg  40 mg Intramuscular Once [employer], PA-C   40 mg at 04/27/12 1356  . potassium chloride SA (K-DUR,KLOR-CON) CR tablet 20 mEq  20 mEq Oral BH-qamhs Curlene Labrum Joeline Freer, MD   20 mEq at 04/27/12 0742  . risperiDONE (RISPERDAL) tablet 4 mg  4 mg Oral QHS Curlene Labrum Denton Derks, MD      . sertraline (ZOLOFT) tablet 75 mg  75 mg Oral Q breakfast Curlene Labrum Leighana Neyman, MD      . topiramate (TOPAMAX) tablet 100 mg  100 mg Oral QHS Curlene Labrum Jackquelyn Sundberg, MD   100 mg at 04/26/12 2147  . triamcinolone ointment (KENALOG) 0.1 %   Topical TID Verne Spurr, PA-C   1 application at 04/27/12 1356  . DISCONTD: risperiDONE (RISPERDAL) tablet 3 mg  3 mg Oral QHS Curlene Labrum Sherlin Sonier, MD   3 mg at 04/26/12 2147  . DISCONTD: sertraline (ZOLOFT) tablet 50 mg  50 mg Oral Q breakfast Curlene Labrum Mosetta Ferdinand, MD   50 mg at 04/27/12 9604   Lab Results:  Results for  orders placed during the hospital encounter of 04/25/12 (from the past 48 hour(s))  TSH     Status: Normal   Collection Time   04/26/12  8:00 PM      Component Value Range Comment   TSH 3.468  0.350 - 4.500 uIU/mL   T3, FREE     Status: Normal   Collection Time   04/26/12  8:00 PM      Component Value Range Comment   T3, Free 3.3  2.3 - 4.2 pg/mL   T4, FREE     Status: Normal   Collection Time   04/26/12  8:00 PM      Component Value Range Comment   Free T4 1.07  0.80 - 1.80 ng/dL    Physical Findings: AIMS: Facial and Oral Movements Muscles of Facial Expression: None, normal Lips and Perioral Area: None, normal Jaw: None, normal Tongue: None, normal,Extremity  Movements Upper (arms, wrists, hands, fingers): None, normal Lower (legs, knees, ankles, toes): None, normal, Trunk Movements Neck, shoulders, hips: None, normal, Overall Severity Severity of abnormal movements (highest score from questions above): None, normal Incapacitation due to abnormal movements: None, normal Patient's awareness of abnormal movements (rate only patient's report): No Awareness, Dental Status Current problems with teeth and/or dentures?: No Does patient usually wear dentures?: No   Review of Systems:  Neurological: The patient denies any headaches today. She denies any seizures or dizziness.  G.I.: The patient denies any constipation or G.I. Upset today.  Musculoskeletal: The patient reports chronic pain related to fibromyalgia which is under good control today.   Time was spent today discussing with the patient her current symptoms. The patient states that she is now sleeping well without difficulty. She reports a good appetite and reports mild feelings of sadness, anhedonia and depressed mood but denies any anxiety symptoms today. She denies any suicidal or homicidal ideations today as well as any auditory or visual hallucinations today as well as any delusional thinking. She does appear to be delusional about her place of employment but with the intensity of these delusions continuing to improve today. She states she is now not sure if the things she was thinking was happening at work was actually happening.  She states today "maybe I was just thinking crazy."  Treatment Plan Summary:  1. Daily contact with patient to assess and evaluate symptoms and progress in treatment.  2. Medication management  3. The patient will deny suicidal ideations or homicidal ideations for 48 hours prior to discharge and have a depression and anxiety rating of 3 or less. The patient will also deny any auditory or visual hallucinations or delusional thinking.  4. The patient will deny any  symptoms of substance withdrawal at time of discharge.   Plan:  1. Will increase the medication Zoloft to 75 mgs po q am for depression.  2. Will continue the medication Wellbutrin XL at 300 mgs po q am to also address depressive symptoms.  3. Will continue the medication Topamax at 100 mgs po qhs for chronic pain.  4. Will increase the medication Risperdal to 4 mgs po qhs for psychosis.  5. Will continue the patient on her non-psychiatric medications.  6. Will continue the medication  KCL at 20 mEq po q am and hs x 6 doses for hypokalemia.  7. Laboratory studies reviewed.  8. Will obtain a repeat CMP this evening to re-evaluate the patient's serum potassium. 9. Will continue to monitor.   Kaisley Stiverson 04/27/2012, 3:11 PM

## 2012-04-27 NOTE — [facility] Counselor (Signed)
Adult Comprehensive Assessment  Patient ID: Brittany Sweeney, female   DOB: [birth year], 51 y.o.   MRN: [id number]  Information Source: Information source: Patient  Current Stressors:  Educational / Learning stressors: no issues reported Employment / Job issues: has not been working since 6/6, parnoid about co-workers Family Relationships: good, but concerned about her after pawning jewlrey and husband's class Research officer, political party / Lack of resources (include bankruptcy): no issues reported Housing / Lack of housing: feels like co-workers bugged her house Physical health (include injuries & life threatening diseases): fibromyalgia Social relationships: paranoid about others Substance abuse: no issues reported Bereavement / Loss: no issues reported  Living/Environment/Situation:  Living Arrangements: Spouse/significant other Living conditions (as described by patient or guardian): fine, however recently concerned about someone bugging the house How long has patient lived in current situation?: 3 years What is atmosphere in current home: Comfortable  Family History:  Marital status: Married Number of Years Married: 34  What types of issues is patient dealing with in the relationship?: no issues identified Additional relationship information: patient has had affairs in the past Does patient have children?: Yes How many children?: 1  (son age 23, Apolinar Junes) How is patient's relationship with their children?: very close  Childhood History:  By whom was/is the patient raised?: Both parents Description of patient's relationship with caregiver when they were a child: very close Patient's description of current relationship with people who raised him/her: very close but doesn't see them as much as she would like Does patient have siblings?: Yes Number of Siblings: 3  (2 sisters, 1 brother) Description of patient's current relationship with siblings: good Did patient suffer any  verbal/emotional/physical/sexual abuse as a child?: No Did patient suffer from severe childhood neglect?: No Has patient ever been sexually abused/assaulted/raped as an adolescent or adult?: No Was the patient ever a victim of a crime or a disaster?: No Witnessed domestic violence?: No Has patient been effected by domestic violence as an adult?: No  Education:  Highest grade of school patient has completed: graduated high school Currently a Consulting civil engineer?: No Learning disability?: Yes What learning problems does patient have?: comprehension  Employment/Work Situation:   Employment situation: Employed Where is patient currently employed?: [employer] How long has patient been employed?: 18 years Patient's job has been impacted by current illness: Yes Describe how patient's job has been implacted: hasn't worked since 6/6. She is paranoid about her co-workers, anxious at work What is the longest time patient has a held a job?: current job Where was the patient employed at that time?: the above Has patient ever been in the [employer]?: No Has patient ever served in Buyer, retail?: No  Financial Resources:   Surveyor, quantity resources: Income from spouse (short term disability) Does patient have a representative payee or guardian?: No  Alcohol/Substance Abuse:   What has been your use of drugs/alcohol within the last 12 months?: none If attempted suicide, did drugs/alcohol play a role in this?: No Alcohol/Substance Abuse Treatment Hx: Denies past history Has alcohol/substance abuse ever caused legal problems?: No  Social Support System:   Patient's Community Support System: Good Describe Community Support System: husband, son, mother Type of faith/religion: Baptist How does patient's faith help to cope with current illness?: attends [employer], it helps a lot, studies her Bible  Leisure/Recreation:   Leisure and Hobbies: walking, working in her flowers, doesn't have a lot of  time after working  Strengths/Needs:   What things does the patient do well?: kind heart, hard  worker In what areas does patient struggle / problems for patient: could have been a better mother  Discharge Plan:   Does patient have access to transportation?: Yes (husband) Will patient be returning to same living situation after discharge?: Yes Currently receiving community mental health services: Yes (From Whom) [facility], will also be seeing Dr. Armanda Magic in their church f) Does patient have financial barriers related to discharge medications?: No  Summary/Recommendations:   Summary and Recommendations (to be completed by the evaluator): Patient is a 51 year old white female with diagnosis of Anxiety D/O NOS and Major Depression, single. Patient was admitted with incresed depression and anxiety. Patient is paranoid and quit work thinking that a co-workers were talking about her and won't go to public places because she thinks people are talking about her. Delusional about people poisioning her food and someone being in the crawl space of her home. Patient has been off medications. Patient has expressed suicidal thoughts. Patient will benefit from crisis stabilization, medication evaluation, group therapy and psychoeducation groups to work on coping skills, case management for referrals and counselor to contact family for suicide prevention information and collateral.  Brittany Sweeney, Brittany Sweeney. 04/27/2012

## 2012-04-27 NOTE — Discharge Planning (Signed)
Spoke to husband who came to see Rhegan last night.  He is concerned in that he describes some behavior that sounds like she is responding to internal stimuli, including mumbling the phrase "good Blu, bad Dyana" and appearing to be distracted while watching television, but denying it.  He does believe she has been harassed at work based her self report.  Concerned about her biting her fingernails.  Not comfortable in return home until less symptomatic.

## 2012-04-27 NOTE — Progress Notes (Signed)
Psychoeducational Group Note  Date:  04/27/2012 Time:  1100  Group Topic/Focus:  Relapse Prevention Planning:   The focus of this group is to define relapse and discuss the need for planning to combat relapse.  Participation Level:  Active  Participation Quality:  Sharing  Affect:  Appropriate  Cognitive:  Alert  Insight:  Good  Engagement in Group:  Good  Additional Comments:  Pts participation in group this morning was excellent. Pt actively participated and shared positively with the group.  Brittany Sweeney 04/27/2012, 1:28 PM

## 2012-04-27 NOTE — Progress Notes (Signed)
[facility] Group Notes:  (Counselor/Nursing/MHT/Case Management/Adjunct)  04/27/2012 2:14 PM  Type of Therapy:  Group Therapy  Participation Level:  Active  Participation Quality:  Attentive and Supportive  Affect:  Depressed  Cognitive:  Oriented  Insight:  Limited  Engagement in Group:  Good  Engagement in Therapy:  Good  Modes of Intervention:  Clarification, Education and Support  Summary of Progress/Problems: Patient talked about keeping up a front because she didn't want anyone to think she was sick. Having a hard time accepting illness, but recognizes need to do something about it.    Kelia Gibbon, [name] 04/27/2012, 2:14 PM

## 2012-04-27 NOTE — Progress Notes (Signed)
Met with patient in Aftercare Planning Group and provided today's workbook on Relapse Prevention. Patient was pleasant but did seem to be responding to some internal stimuli. During Aftercare Planning Group, Case Manager provided psychoeducation on "Suicide Prevention Information." This included descriptions of risk factors for suicide, warning signs that an individual is in crisis and thinking of suicide, and what to do if this occurs. Pt indicated understanding of information provided, and will read brochure given upon discharge. Patient was quiet but answered questions when directed to her. Patient will return to live with husband at discharge, has transportation and access to medications. Joice Lofts RN MS EdS 04/27/2012  2:17 PM

## 2012-04-28 NOTE — Progress Notes (Signed)
  DENNISHA MOUSER is a 51 y.o. female [id number] [birth year]  04/25/2012 Principal Problem:  *Schizophrenia, paranoid type   Mental Status: Mood is bright and cheerful. Denies SI/HI/AVH.    Subjective/Objective:Woke up and felt "wierd" but after am meds felt normal. She does not mention anything about work and staff are not reporting delusional thinking today.    Filed Vitals:   04/28/12 0701  BP: 118/79  Pulse: 91  Temp:   Resp:     Lab Results:   BMET    Component Value Date/Time   NA 141 04/27/2012 1930   K 3.7 04/27/2012 1930   CL 110 04/27/2012 1930   CO2 21 04/27/2012 1930   GLUCOSE 182* 04/27/2012 1930   BUN 15 04/27/2012 1930   CREATININE 0.71 04/27/2012 1930   CALCIUM 9.8 04/27/2012 1930   GFRNONAA >90 04/27/2012 1930   GFRAA >90 04/27/2012 1930    Medications:  Scheduled:     . buPROPion  300 mg Oral Daily  . carvedilol  6.25 mg Oral BID WC  . potassium chloride  20 mEq Oral BH-qamhs  . risperiDONE  4 mg Oral QHS  . sertraline  75 mg Oral Q breakfast  . topiramate  100 mg Oral QHS  . triamcinolone ointment   Topical TID     PRN Meds acetaminophen, alum & mag hydroxide-simeth, magnesium hydroxide  Plan: continue current plan of care.  Kylar Speelman,MICKIE D. 04/28/2012

## 2012-04-28 NOTE — Progress Notes (Signed)
Patient ID: Brittany Sweeney, female   DOB: 02/09/1961, 51 y.o.   MRN: [id number]  [facility] Group Notes:  (Counselor/Nursing/MHT/Case Management/Adjunct)  04/28/2012 11 AM  Type of Therapy:  Aftercare Planning, Group Therapy, Dance/Movement Therapy   Participation Level:  Minimal  Participation Quality:  Appropriate  Affect:  Appropriate  Cognitive:  Appropriate  Insight:  Limited  Engagement in Group:  Limited  Engagement in Therapy:  Good  Modes of Intervention:  Clarification, Problem-solving, Role-play, Socialization and Support  Summary of Progress/Problems: After Care: Pt. attended and participated in aftercare planning group. Pt. accepted information on suicide prevention, warning signs to look for with suicide and crisis line numbers to use. The pt. agreed to call crisis line numbers if having warning signs or having thoughts of suicide. Pt. listed their current mood as feeling good like the color purple. Counseling:  Therapist discussed self sabotaging actions typically done in order to make ourselves happy.  Therapist discussed reasons why we tend to fall victim to sabotage behaviors. Therapist asked patient what are your self sabotaging behaviors, what leads you there and what can you do avoid the behaviors.  Pt. stated, " I tend to overeat.  Something positive I can do is exercise and think happy thoughts."  Rhunette Croft

## 2012-04-28 NOTE — Progress Notes (Signed)
[facility] Group Notes:  (Counselor/Nursing/MHT/Case Management/Adjunct)  04/28/2012 2:57 AM  Type of Therapy:  Psychoeducational Skills  Participation Level:  Active  Participation Quality:  Attentive  Affect:  Appropriate  Cognitive:  Alert  Insight:  Good  Engagement in Group:  Good  Engagement in Therapy:  Good  Modes of Intervention:  Activity  Summary of Progress/Problems: Pt interacted well during group.   Lakindra Wible A 04/28/2012, 2:57 AM

## 2012-04-28 NOTE — Progress Notes (Signed)
Patient ID: Brittany Sweeney, female   DOB: 10/15/1961, 51 y.o.   MRN: [id number] 04-28-12 @ 1400 D: pt has been very paranoid today. A: rn has attempted to gain this pts trust thru conversation and an explanation of all interactions with her. R: she has been receptive to suggestions, gone to group and not expressed any complaints. rn will monitor and q 15 min cks continue.

## 2012-04-28 NOTE — Progress Notes (Signed)
Psychoeducational Group Note  Date:  04/28/2012 Time:  1515  Group Topic/Focus:  Healthy Communication:   The focus of this group is to discuss communication, barriers to communication, as well as healthy ways to communicate with others.  Participation Level:  Did Not Attend  Participation Quality:    Affect:    Cognitive:    Insight:    Engagement in Group:    Additional Comments:  none  Marquis Lunch, Kelia Gibbon 04/28/2012, 4:56 PM

## 2012-04-28 NOTE — Progress Notes (Signed)
Psychoeducational Group Note  Date:  04/28/2012 Time: 0945  Group Topic/Focus:  Identifying Needs:   The focus of this group is to help patients identify their personal needs that have been historically problematic and identify healthy behaviors to address their needs.  Participation Level:  Active  Participation Quality:  Appropriate  Affect:  Depressed  Cognitive:  Alert and Oriented  Insight:  Good  Engagement in Group:  Good  Additional Comments:    Valente David 04/28/2012, 10:32 AM

## 2012-04-28 NOTE — Progress Notes (Signed)
Patient ID: Brittany Sweeney, female   DOB: August 25, 1961, 51 y.o.   MRN: [id number] D. The patient was pleasant but somewhat superficial when speaking with staff while in the dayroom. Rash still present on face, neck and chest. Requested to speak with staff alone and  expressed her need to have her roommate removed from her room. Stated that she felt her life was in danger if her roommate stayed in her room. She was unable to explain her thoughts, but was anxious and adamant that she could not share a room with this other patient. A. Reality oriented emotional support given. Assured patient of her safety. Moved to a room with a no roommate order. Medications administered. R. The patient remains paranoid. Stated she felt better being in a room by herself.

## 2012-04-29 NOTE — Progress Notes (Signed)
  Brittany Sweeney is a 51 y.o. female [id number] 24-Mar-1961  04/25/2012 Principal Problem:  *Schizophrenia, paranoid type   Mental Status: Mood she feels she is back to baseline denies SI/HI/AVH.    Subjective/Objective:  Still feels weird when she first wakes up and improves as the day goes on. Will check to see if elevated glucose  04/27/12 Is still elevated or not.  Filed Vitals:   04/29/12 0701  BP: 134/78  Pulse: 101  Temp:   Resp:     Lab Results:   BMET    Component Value Date/Time   NA 141 04/27/2012 1930   K 3.7 04/27/2012 1930   CL 110 04/27/2012 1930   CO2 21 04/27/2012 1930   GLUCOSE 182* 04/27/2012 1930   BUN 15 04/27/2012 1930   CREATININE 0.71 04/27/2012 1930   CALCIUM 9.8 04/27/2012 1930   GFRNONAA >90 04/27/2012 1930   GFRAA >90 04/27/2012 1930    Medications:  Scheduled:     . buPROPion  300 mg Oral Daily  . carvedilol  6.25 mg Oral BID WC  . risperiDONE  4 mg Oral QHS  . sertraline  75 mg Oral Q breakfast  . topiramate  100 mg Oral QHS  . triamcinolone ointment   Topical TID     PRN Meds acetaminophen, alum & mag hydroxide-simeth, magnesium hydroxide  Plan: check HgbA1C and BMet   Brittany Sweeney,Brittany Sweeney. 04/29/2012

## 2012-04-29 NOTE — Progress Notes (Addendum)
Psychoeducational Group Note  Date:  04/29/2012 Time:  0945  Group Topic/Focus:  Making Healthy Choices:   The focus of this group is to help patients identify negative/unhealthy choices they were using prior to admission and identify positive/healthier coping strategies to replace them upon discharge.  Participation Level:  Active  Participation Quality:  Appropriate  Affect:  Appropriate  Cognitive:  Appropriate  Insight:  Good  Engagement in Group:  Good  Additional Comments:    Valente David 04/29/2012, 10:35 AM

## 2012-04-29 NOTE — Progress Notes (Signed)
Psychoeducational Group Note  Date:  04/29/2012 Time:  1515  Group Topic/Focus:  Conflict Resolution:   The focus of this group is to discuss the conflict resolution process and how it may be used upon discharge.  Participation Level:  Active  Participation Quality:  Attentive  Affect:  Appropriate  Cognitive:  Alert  Insight:  Good  Engagement in Group:  Good  Additional Comments:  Very active and attentive  Graceann Congress Celcia 04/29/2012, 5:12 PM

## 2012-04-29 NOTE — Progress Notes (Signed)
Patient ID: Brittany Sweeney, female   DOB: Nov 08, 1960, 51 y.o.   MRN: [id number] 04-29-12 D: pt has a hx of schizophrenia paranoid type. She is still mildly paranoid and makes odd stms at time; A: she continue to take her medications and interact with the rn, staff and at groups. R: her paranoia seems to be decreasing and she is becoming more social. She has been pleasant/cooperative. rn will continue to monitor and q 15 min checks continue.

## 2012-04-29 NOTE — Progress Notes (Signed)
[facility] Group Notes:  (Counselor/Nursing/MHT/Case Management/Adjunct)  04/29/2012 12:59 AM  Type of Therapy:  Psychoeducational Skills  Participation Level:  Minimal  Participation Quality:  Appropriate  Affect:  Appropriate  Cognitive:  Appropriate  Insight:  Good  Engagement in Group:  Good  Engagement in Therapy:  Good  Modes of Intervention:  Education  Summary of Progress/Problems: Pt.states that she feels better and that her goal for tomorrow is to continue working on her health.    Korion Cuevas S 04/29/2012, 12:59 AM

## 2012-04-29 NOTE — Progress Notes (Signed)
Patient ID: Brittany Sweeney, female   DOB: 07/01/1961, 51 y.o.   MRN: [id number]   [facility] Group Notes:  (Counselor/Nursing/MHT/Case Management/Adjunct)  04/29/2012 11 AM  Type of Therapy:  Aftercare Planning, Group Therapy, Dance/Movement Therapy   Participation Level:  Minimal  Participation Quality:  Appropriate  Affect:  Appropriate  Cognitive:  Appropriate  Insight:  Good  Engagement in Group:  Limited  Engagement in Therapy:  Limited  Modes of Intervention:  Clarification, Problem-solving, Role-play, Socialization and Support  Summary of Progress/Problems: After Care: Pt. attended and participated in aftercare planning group. Pt. verbally accepted information on suicide prevention, warning signs to look for with suicide and crisis line numbers to use. Pt. reported that she was feeling pretty good today.  Counseling:  Therapist and group members dicussed what we need in a healthy support system and what we do not need. Group members shared their ideas and therapist modeled healthy and unhealthy support systems. Therapist and group members discussed how we know the difference between a good support and a bad support. Pt. shared that family can be a good support but was quiet for the majority of the group.   Cassidi Long

## 2012-04-29 NOTE — Progress Notes (Signed)
[facility] Group Notes:  (Counselor/Nursing/MHT/Case Management/Adjunct)  04/29/2012 9:38 PM  Type of Therapy:  Psychoeducational Skills  Participation Level:  Minimal  Participation Quality:  Resistant  Affect:  Depressed  Cognitive:  Appropriate  Insight:  Good  Engagement in Group:  Limited  Engagement in Therapy:  Limited  Modes of Intervention:  Education  Summary of Progress/Problems:Pt. States that her goal is to be discharged and to speak with her doctor about having her medication adjusted.    [name] S 04/29/2012, 9:38 PM

## 2012-04-30 LAB — BASIC METABOLIC PANEL
BUN: 15 mg/dL (ref 6–23)
Calcium: 9.4 mg/dL (ref 8.4–10.5)
GFR calc Af Amer: >90 mL/min (ref >90)
GFR calc non Af Amer: >90 mL/min (ref >90)
Glucose, Bld: 90 mg/dL (ref 70–99)
Sodium: 140 mEq/L (ref 135–145)

## 2012-04-30 LAB — HEMOGLOBIN A1C: Hgb A1c MFr Bld: 5.6 % (ref <5.7)

## 2012-04-30 MED ORDER — RISPERIDONE 2 MG PO TABS
2.0000 mg | ORAL_TABLET | Freq: Every day | ORAL | Status: DC
  Administered 2012-05-01: 2 mg via ORAL
  Filled 2012-04-30: qty 42
  Filled 2012-04-30 (×2): qty 1

## 2012-04-30 MED ORDER — PREDNISONE 10 MG PO TABS
10.0000 mg | ORAL_TABLET | Freq: Every day | ORAL | Status: DC
  Administered 2012-05-01: 10 mg via ORAL
  Filled 2012-04-30 (×3): qty 1

## 2012-04-30 MED ORDER — PREDNISONE 20 MG PO TABS
20.0000 mg | ORAL_TABLET | Freq: Every day | ORAL | Status: AC
  Administered 2012-04-30: 20 mg via ORAL
  Filled 2012-04-30: qty 1

## 2012-04-30 NOTE — Progress Notes (Signed)
Patient ID: Brittany Sweeney, female   DOB: 1961-07-28, 51 y.o.   MRN: [id number] She has been up and to groups interacting with peers and staff. Denies thought of harming self or others.  Stated that she was a little paranoid last Pm but not at this time. Pediason was ordered and 1st dose given for rash on hand and face.

## 2012-04-30 NOTE — Discharge Planning (Signed)
Met with patient in Aftercare Planning Group and provided today's workbook based on theme of the day. She expressed readiness for discharge, but did complain of continuing lightheadedness when she gets up.  She stated that she will need a letter at discharge for her work; however, she was informed that her son had stated to other staff that her FMLA needed to be updated today.  He will send pertinent information as needed, she said.  Case Manager sent referral for [facility] appointment, awaiting call back.  Ambrose Mantle, LCSW 04/30/2012, 1:17 PM

## 2012-04-30 NOTE — Tx Team (Signed)
Interdisciplinary Treatment Plan Update (Adult)  Date:  04/30/2012  Time Reviewed:  10:15AM-11:15AM  Progress in Treatment: Attending groups:  Yes Participating in groups:    Yes Taking medication as prescribed:    Yes Tolerating medication:   Yes Family/Significant other contact made:  Yes Patient understands diagnosis:   Yes Discussing patient identified problems/goals with staff:   Yes Medical problems stabilized or resolved:   No, still has a rash and needs to continue oral meds -- there is concern about her discharging and going to a primary care physician re the itching, because a doctor might give her a steroid injection which could again have the effect of making her paranoid.  Want her to stay here for the rash to avoid the paranoia. Denies suicidal/homicidal ideation:  Yes Issues/concerns per patient self-inventory:   None Other:    New problem(s) identified: Yes, Describe:  rash -- steroids created paranoia  Reason for Continuation of Hospitalization: Medication stabilization Other; describe paranoia  Interventions implemented related to continuation of hospitalization:  Medication monitoring and adjustment, safety checks Q15 min., suicide risk assessment, group therapy, psychoeducation, collateral contact, aftercare planning, ongoing physician assessments, medication education  Additional comments:  Not applicable  Estimated length of stay:  1-2 days  Discharge Plan:  Return home with husband, follow up with [facility]  New goal(s):  Not applicable  Review of initial/current patient goals per problem list:   1.  Goal(s):  Reduce symptoms of depression to a 3 or less on a scale of 1-10.  Met:  Yes  Target date:  By Discharge   As evidenced by:  "2" today  2.  Goal(s):  Eliminate Suicidal Ideation or thoughts of wanting to die  Met:  Yes  Target date:  By Discharge   As evidenced by:  None  3.  Goal(s):  Follow-up appointment for follow-up with  provider.  Met:  No  Target date:  By Discharge   As evidenced by:  Still needed  4.  Goal(s):  Reduce anxiety to no more than 3 at discharge.  Met:  Yes  Target date:  By Discharge   As evidenced by:  "0" today  5. Goal: Reduce paranoid thoughts to baseline per patient's husband.  Met: No  Target Date: By Discharge  As evidenced by: Paranoia is increased again, possibly due to steroid injection for rash   Attendees: Patient:  Brittany Sweeney  04/30/2012 10:15AM-11:15AM  Family:     Physician:  Dr. Harvie Heck Readling 04/30/2012 10:15AM-11:15AM  Nursing:   Joslyn Devon, RN 04/30/2012 10:15AM -11:15AM   Case Manager:  Ambrose Mantle, LCSW 04/30/2012 10:15AM-11:15AM  Counselor:  Veto Kemps, MT-BC 04/30/2012 10:15AM-11:15AM  Other:   Roswell Miners, RN 04/30/2012 11:31 AM   Other:      Other:      Other:       Scribe for Treatment Team:   Sarina Ser, 04/30/2012, 10:15AM-11:15AM

## 2012-04-30 NOTE — Progress Notes (Signed)
Patient ID: Brittany Sweeney, female   DOB: 20-Jan-1961, 51 y.o.   MRN: [id number] D. The patient is bright and social, interacting appropriately in the milieu. Rash is clearing up on her face and neck. A. Q 15 minute checks maintained for safety. Engaged in 1:1 conversation for assessment of thoughts and behavior. Administered scheduled medication. Informed patient of lab work in a.m. and the need for fasting before blood is drawn. R. The patient feels she is doing much better and hoping for discharge tomorrow. Stated that she still sometimes has paranoid thoughts, but feels her medication is helping. Compliant with medication. Understanding of blood work in morning is understood.

## 2012-04-30 NOTE — Progress Notes (Signed)
D: Pt leaving phone room on approach. Appears flat and depressed, but does brighten at intervals in conversation. Calm and cooperative with assessment. No acute distress noted. States she had a good day r/t being visited by her husband and son. States she was able to get a jigsaw puzzle put together and that made her feel good. States she feels like she is ready to discharge. States she plans to go to marriage counseling (husband has already arranged this) and stay compliant with her meds. Denies SI/HI/AVH and contracts for safety. Does endorse some paranoid thoughts about staff when they round at night, but she does not have any paranoia about her coworkers. She does feel safe on the unit.  A: Support and encouragement provided. Safety has been maintained with Q15 minute observation. POC and medications for the shift reviewed and understanding verbalized. Meds given as ordered.   R: Pt remains safe. She is compliant with treatment plan and is goal directed. She is pleasant and interacting well with peers. She offers no questions or concerns. Will continue Q15 minute observation and continue current POC.

## 2012-04-30 NOTE — Progress Notes (Signed)
[facility] Group Notes:  (Counselor/Nursing/MHT/Case Management/Adjunct)  04/30/2012 3:08 PM  Type of Therapy:  Group Therapy  Participation Level:  Active  Participation Quality:  Attentive and Sharing  Affect:  Appropriate  Cognitive:  Oriented  Insight:  Good  Engagement in Group:  Good  Engagement in Therapy:  Good  Modes of Intervention:  Clarification, Education, Problem-solving and Support  Summary of Progress/Problems: Patient was able to identify a whole list of ways she would take care of herself including exercise, taking her medications, going to appointments with psychiatrist and going to marital counseling with husband. Talked with her more about letting someone know what was going on with her instead of being afraid that people would think she was crazy. She identified several symptoms from pamphlet diagnosing bipolar. Talked about having spurts of energy as well as other symptoms. Encouraged her to talk to the doctor about these symptoms. Stated that she had been depressed but nothing like this. Explained to her about the psychosis. Patient is able to identify how her thinking may have been off and expressed some fear about this. Talked about going back to work, her FMLA and not wanting to rush back until she was more used to her medications.   Brittany Sweeney, [name] 04/30/2012, 3:08 PM

## 2012-04-30 NOTE — Progress Notes (Signed)
Psychoeducational Group Note  Date:  04/30/2012 Time:  1100  Group Topic/Focus:  Self Care:   The focus of this group is to help patients understand the importance of self-care in order to improve or restore emotional, physical, spiritual, interpersonal, and financial health.  Participation Level:  Active  Participation Quality:  Sharing  Affect:  Appropriate  Cognitive:  Alert  Insight:  Good  Engagement in Group:  Good  Additional Comments:  Pt. Actively engaged in the group discussion and willing shared some experiences with the other pts.  Brittany Sweeney 04/30/2012, 2:36 PM

## 2012-04-30 NOTE — Progress Notes (Signed)
[facility] MD Progress Note  04/30/2012 2:22 PM  Diagnosis:  Axis I: Schizophrenia - Paranoid Type.   The patient was seen today and reports the following:   ADL's: Intact.  Sleep: The patient reports to having some difficulty initiating and maintaining sleep last night.  Appetite: The patient reports that her appetite is good today.   Mild>(1-10) >Severe  Hopelessness (1-10): 0  Depression (1-10): 2-3  Anxiety (1-10): 0   Suicidal Ideation: The patient denies any suicidal ideations today.  Plan: No  Intent: No  Means: No   Homicidal Ideation: The patient denies any homicidal ideations today.  Plan: No  Intent: No.  Means: No   General Appearance/Behavior: The patient remains friendly and cooperative today with this provider.  Eye Contact: Good.  Speech: Appropriate in rate and volume today with no pressuring noted.  Motor Behavior: wnl.  Level of Consciousness: Alert and Oriented x 3.  Mental Status: Alert and Oriented x 3.  Mood: Appears mildly depressed today.  Affect: Appears mildly constricted.  Anxiety Level: No anxiety reported today.  Thought Process: Paranoid delusions continue but much improved.  Thought Content: The patient denies any auditory or visual hallucinations today. She continues to report paranoid delusions related to staff on the unit.  Perception: Paranoid delusions continue but improving.  Judgment: Fair.  Insight: Fair.  Cognition: Oriented to person, place and time.  Sleep:  Number of Hours: 6.75    Vital Signs:Blood pressure 99/67, pulse 105, temperature 98.7 F (37.1 C), temperature source Oral, resp. rate 16, height 5\' 5"  (1.651 m), weight 83.462 kg (184 lb).  Current Medications: Current Facility-Administered Medications  Medication Dose Route Frequency Provider Last Rate Last Dose  . acetaminophen (TYLENOL) tablet 650 mg  650 mg Oral Q6H PRN Curlene Labrum Neilani Duffee, MD      . alum & mag hydroxide-simeth (MAALOX/MYLANTA) 200-200-20 MG/5ML suspension  30 mL  30 mL Oral Q4H PRN Curlene Labrum Suezette Lafave, MD      . buPROPion (WELLBUTRIN XL) 24 hr tablet 300 mg  300 mg Oral Daily Curlene Labrum Tennile Styles, MD   300 mg at 04/30/12 0806  . carvedilol (COREG) tablet 6.25 mg  6.25 mg Oral BID WC Curlene Labrum Trayquan Kolakowski, MD   6.25 mg at 04/30/12 0806  . magnesium hydroxide (MILK OF MAGNESIA) suspension 30 mL  30 mL Oral Daily PRN Curlene Labrum Melville Engen, MD   30 mL at 04/30/12 1147  . predniSONE (DELTASONE) tablet 10 mg  10 mg Oral Q breakfast Verne Spurr, PA-C      . predniSONE (DELTASONE) tablet 20 mg  20 mg Oral Q breakfast Verne Spurr, PA-C   20 mg at 04/30/12 1314  . risperiDONE (RISPERDAL) tablet 2 mg  2 mg Oral Q breakfast Curlene Labrum Elvan Ebron, MD      . risperiDONE (RISPERDAL) tablet 4 mg  4 mg Oral QHS Curlene Labrum Michaelann Gunnoe, MD   4 mg at 04/29/12 2151  . sertraline (ZOLOFT) tablet 75 mg  75 mg Oral Q breakfast Curlene Labrum Mahamud Metts, MD   75 mg at 04/30/12 0806  . topiramate (TOPAMAX) tablet 100 mg  100 mg Oral QHS Curlene Labrum Daymian Lill, MD   100 mg at 04/29/12 2151  . triamcinolone ointment (KENALOG) 0.1 %   Topical TID Verne Spurr, PA-C       Lab Results:  Results for orders placed during the hospital encounter of 04/25/12 (from the past 48 hour(s))  HEMOGLOBIN A1C     Status: Normal   Collection Time  04/30/12  6:13 AM      Component Value Range Comment   Hemoglobin A1C 5.6  <5.7 %    Mean Plasma Glucose 114  <117 mg/dL   BASIC METABOLIC PANEL     Status: Normal   Collection Time   04/30/12  6:13 AM      Component Value Range Comment   Sodium 140  135 - 145 mEq/L    Potassium 3.9  3.5 - 5.1 mEq/L    Chloride 105  96 - 112 mEq/L    CO2 27  19 - 32 mEq/L    Glucose, Bld 90  70 - 99 mg/dL    BUN 15  6 - 23 mg/dL    Creatinine, Ser 1.61  0.50 - 1.10 mg/dL    Calcium 9.4  8.4 - 09.6 mg/dL    GFR calc non Af Amer >90  >90 mL/min    GFR calc Af Amer >90  >90 mL/min    Physical Findings: AIMS: Facial and Oral Movements Muscles of Facial Expression: None, normal Lips and  Perioral Area: None, normal Jaw: None, normal Tongue: None, normal,Extremity Movements Upper (arms, wrists, hands, fingers): None, normal Lower (legs, knees, ankles, toes): None, normal, Trunk Movements Neck, shoulders, hips: None, normal, Overall Severity Severity of abnormal movements (highest score from questions above): None, normal Incapacitation due to abnormal movements: None, normal Patient's awareness of abnormal movements (rate only patient's report): No Awareness, Dental Status Current problems with teeth and/or dentures?: No Does patient usually wear dentures?: No   Review of Systems:  Neurological: The patient denies any headaches today. She denies any seizures or dizziness.  G.I.: The patient denies any constipation or G.I. Upset today.  Musculoskeletal: The patient reports chronic pain related to fibromyalgia which is under good control today.   Time was spent today discussing with the patient her current symptoms. The patient states that she did have some difficulty initiating and maintaining sleep last night. She reports a good appetite and reports mild feelings of sadness, anhedonia and depressed mood but denies any anxiety symptoms today. She denies any suicidal or homicidal ideations today as well as any auditory or visual hallucinations today.  She does report some paranoid delusions about staff at [facility] but states she is no longer worried about individuals at her work.  Treatment Plan Summary:  1. Daily contact with patient to assess and evaluate symptoms and progress in treatment.  2. Medication management  3. The patient will deny suicidal ideations or homicidal ideations for 48 hours prior to discharge and have a depression and anxiety rating of 3 or less. The patient will also deny any auditory or visual hallucinations or delusional thinking.  4. The patient will deny any symptoms of substance withdrawal at time of discharge.   Plan:  1. Will continue the medication  Zoloft at 75 mgs po q am for depression.  2. Will continue the medication Wellbutrin XL at 300 mgs po q am to also address depressive symptoms.  3. Will continue the medication Topamax at 100 mgs po qhs for chronic pain.  4. Will increase the medication Risperdal to 2 mgs po q am and 4 mgs po qhs for psychosis.  5. Will continue the patient on her non-psychiatric medications.  6. Laboratory studies reviewed.  7. Will continue to monitor.   Saidi Santacroce 04/30/2012, 2:22 PM

## 2012-05-01 MED ORDER — SERTRALINE HCL 25 MG PO TABS: 75.0000 mg | ORAL_TABLET | Freq: Every day | ORAL | Status: DC

## 2012-05-01 MED ORDER — BUPROPION HCL ER (XL) 300 MG PO TB24: 300.0000 mg | ORAL_TABLET | Freq: Every day | ORAL | Status: DC

## 2012-05-01 MED ORDER — CARVEDILOL 6.25 MG PO TABS: 6.2500 mg | ORAL_TABLET | Freq: Two times a day (BID) | ORAL | Status: DC

## 2012-05-01 MED ORDER — TOPIRAMATE 100 MG PO TABS: 100.0000 mg | ORAL_TABLET | Freq: Every day | ORAL | Status: DC

## 2012-05-01 MED ORDER — TRIAMCINOLONE ACETONIDE 0.1 % EX OINT: TOPICAL_OINTMENT | Freq: Three times a day (TID) | CUTANEOUS | Status: DC

## 2012-05-01 MED ORDER — RISPERIDONE 2 MG PO TABS: 2.0000 mg | ORAL_TABLET | Freq: Every day | ORAL | Status: DC

## 2012-05-01 NOTE — Progress Notes (Signed)
[facility] Case Management Discharge Plan:  Will you be returning to the same living situation after discharge: Yes,  with husband At discharge, do you have transportation home?:Yes,  husband to transport Do you have the ability to pay for your medications:Yes,  insurance and income  Interagency Information:     Release of information consent forms completed and in the chart;  Patient's signature needed at discharge.  Patient to Follow up at:  Follow-up Information    [facility] with [facility] on 05/03/2012. (8:00am appointment )    Contact information:   [facility] [address], [place] Telelphone:  [phone]         Patient denies SI/HI:   Yes,      Safety Planning and Suicide Prevention discussed:  Yes  Barrier to discharge identified:No.  Summary and Recommendations:  Follow up with Dr. Ronne Binning at [facility].   Sarina Ser 05/01/2012, 10:15 AM

## 2012-05-01 NOTE — Progress Notes (Signed)
Patient discharged home per MD order.  Discharge instructions were reviewed with patient.  Samples of medications and prescriptions were reviewed and given.  Patient exhibited suspicious behavior when asked to sign consent for [facility].  She did not understand why they would need her information.  She did consent to sign when staff spoke with son out in the lobby.  He suggested to patient that she should sign it.  Patient was concerned about her diagnosis and was reviewed with her as well.  Staff encouraged her to stay on her medications for continued treatment and keep her appointment.  She stated that she is employed, but requested a letter from [employer] to stay out for another week.  She received her letter from [facility] and was appreciative.  She denies any SI/HI/AVH.  She denies hearing any voices, stating "I've never heard any voices."  She was cooperative and her behavior was appropriate.    Patient left ambulatory with her son.  She did not have any belongings in her locker.

## 2012-05-01 NOTE — [facility] Suicide Risk Assessment (Signed)
Suicide Risk Assessment  Discharge Assessment     Demographic factors:  Caucasian  Current Mental Status Per Nursing Assessment::   On Admission:   (Pt denies) At Discharge: The patient was AO x 3 and was friendly and cooperative with this provider. The patient reports to feeling much better today and feels ready for discharge. She reports to sleeping very well last night and reports a good appetite. The patient denies any significant feelings of sadness, anhedonia or depressed mood. She denies any anxiety symptoms and adamantly denies any suicidal or homicidal ideations. She also denies any auditory or visual hallucinations or delusional thinking.  She states she is no longer feels that others are trying to harm her.  She denies any medication related side effects or other concerns.    The patient is scheduled to follow up with [facility] in [facility] on May 03, 2012 at 8 am.   Current Mental Status Per Physician:  Diagnosis:  Axis I: Schizophrenia - Paranoid Type.   The patient was seen today and reports the following:   ADL's: Intact.  Sleep: The patient reports to sleeping well last night without difficulty.  Appetite: The patient reports a good appetite today.   Mild>(1-10) >Severe  Hopelessness (1-10): 0  Depression (1-10): 0  Anxiety (1-10): 0   Suicidal Ideation: The patient adamantly denies any suicidal ideations today.  Plan: No  Intent: No  Means: No   Homicidal Ideation: The patient adamantly denies any homicidal ideations today.  Plan: No  Intent: No.  Means: No   General Appearance/Behavior: The patient remains friendly and cooperative today with this provider.  Eye Contact: Good.  Speech: Appropriate in rate and volume today with no pressuring noted.  Motor Behavior: wnl.  Level of Consciousness: Alert and Oriented x 3.  Mental Status: Alert and Oriented x 3.  Mood: Appears essentially euthymic today.  Affect: Appears bright and full.  Anxiety  Level: No anxiety reported today.  Thought Process: wnl.  Thought Content: The patient adamantly denies any auditory or visual hallucinations today. She also denies any delusional thinking today.  Perception: wnl.  Judgment: Fair to Good.  Insight: Fair to Good.  Cognition: Oriented to person, place and time.   Loss Factors: Decrease in vocational status  Historical Factors: History of mental illness.  Risk Reduction Factors:   Good family support.  Good access to healthcare.  Continued Clinical Symptoms:  Schizophrenia:   Paranoid or undifferentiated type Previous Psychiatric Diagnoses and Treatments Medical Diagnoses and Treatments/Surgeries  Discharge Diagnoses:   AXIS I:   Schizophrenia - Paranoid Type.  AXIS II:   Deferred. AXIS III:   1.  Cardiomyopathy.   2.  Fibromyalgia.   3.  Hypertension. AXIS IV:   Chronic Mental Illness.  Chronic Non-psychiatric Medical Issues. AXIS V:   GAF at time of admission approximately 35.  GAF at time of discharge approximately 60.  Cognitive Features That Contribute To Risk:  None Noted.    Physical Findings:  AIMS: Facial and Oral Movements  Muscles of Facial Expression: None, normal  Lips and Perioral Area: None, normal  Jaw: None, normal  Tongue: None, normal,Extremity Movements  Upper (arms, wrists, hands, fingers): None, normal  Lower (legs, knees, ankles, toes): None, normal, Trunk Movements  Neck, shoulders, hips: None, normal, Overall Severity  Severity of abnormal movements (highest score from questions above): None, normal  Incapacitation due to abnormal movements: None, normal  Patient's awareness of abnormal movements (rate only patient's report): No Awareness,  Dental Status  Current problems with teeth and/or dentures?: No  Does patient usually wear dentures?: No   Review of Systems:  Neurological: The patient denies any headaches today. She denies any seizures or dizziness.  G.I.: The patient denies any  constipation or G.I. Upset today.  Musculoskeletal: The patient reports chronic pain related to fibromyalgia which is under good control today.   Time was spent today discussing with the patient her current symptoms.  The patient was AO x 3 and was friendly and cooperative with this provider. The patient reports to feeling much better today and feels ready for discharge. She reports to sleeping very well last night and reports a good appetite. The patient denies any significant feelings of sadness, anhedonia or depressed mood. She denies any anxiety symptoms and adamantly denies any suicidal or homicidal ideations. She also denies any auditory or visual hallucinations or delusional thinking.  She states she is no longer feels that others are trying to harm her.  She denies any medication related side effects or other concerns.    The patient is scheduled to follow up with [facility] in [facility] on May 03, 2012 at 8 am.   Treatment Plan Summary:  1. Daily contact with patient to assess and evaluate symptoms and progress in treatment.  2. Medication management  3. The patient will deny suicidal ideations or homicidal ideations for 48 hours prior to discharge and have a depression and anxiety rating of 3 or less. The patient will also deny any auditory or visual hallucinations or delusional thinking.  4. The patient will deny any symptoms of substance withdrawal at time of discharge.   Plan:  1. Will continue the medication Zoloft at 75 mgs po q am for depression.  2. Will continue the medication Wellbutrin XL at 300 mgs po q am to also address depressive symptoms.  3. Will continue the medication Topamax at 100 mgs po qhs for chronic pain.  4. Will continue the medication Risperdal at 2 mgs po q am and 4 mgs po qhs for psychosis.  5. Will continue the patient on her non-psychiatric medications.  6. Laboratory studies reviewed.  7. Will continue to monitor.  8. Will discharge the patient  today to outpatient follow up at [facility] in [facility] on May 03, 2012 at 8 am.  Suicide Risk:  Minimal: No identifiable suicidal ideation.  Patients presenting with no risk factors but with morbid ruminations; may be classified as minimal risk based on the severity of the depressive symptoms  Plan Of Care/Follow-up recommendations:  Activity:  As tolerated. Diet:  Heart Health Diet. Other:  Please take all medications only as directed and keep all scheduled follow up appointments.  Draiden Mirsky 05/01/2012, 11:32 AM

## 2012-05-01 NOTE — Tx Team (Signed)
Interdisciplinary Treatment Plan Update (Adult)  Date:  05/01/2012  Time Reviewed:  10:15AM-11:15AM  Progress in Treatment: Attending groups:  Yes Participating in groups:    Yes Taking medication as prescribed:    Yes Tolerating medication:  Yes  Family/Significant other contact made: Yes  Patient understands diagnosis:   Yes Discussing patient identified problems/goals with staff:   Yes Medical problems stabilized or resolved:   Yes Denies suicidal/homicidal ideation:  Yes Issues/concerns per patient self-inventory:   None Other:    New problem(s) identified: No, Describe:    Reason for Continuation of Hospitalization: Discharge today - no reasons to keep  Interventions implemented related to continuation of hospitalization:  Medication monitoring and adjustment, safety checks Q15 min., suicide risk assessment, group therapy, psychoeducation, collateral contact, aftercare planning, ongoing physician assessments, medication education - UNTIL DISCHARGE  Additional comments:  Not applicable  Estimated length of stay:  Discharge today  Discharge Plan:  Return home with husband, follow up with [facility] is arranged  New goal(s):  Not applicable  Review of initial/current patient goals per problem list:   1.  Goal(s):  Reduce symptoms of depression to a 3 or less on a scale of 1-10.  Met:  Yes  Target date:  By Discharge   As evidenced by:  "0" today  2.  Goal(s):  Eliminate Suicidal Ideation or thoughts of wanting to die  Met:  Yes  Target date:  By Discharge   As evidenced by:  Has not had SI for over 48 hours  3.  Goal(s):  Follow-up appointment for follow-up with provider.  Met:  Yes  Target date:  By Discharge   As evidenced by:  Arranged with [facility]  4.  Goal(s):  Reduce anxiety to no more than 3 at discharge.  Met:  Yes  Target date:  By Discharge   As evidenced by:  "0" today  5. Goal: Reduce paranoid thoughts to baseline per patient's husband.    Met: Yes Target Date: By Discharge  As evidenced by:   Paranoia much better  Attendees: Patient:  Brittany Sweeney  05/01/2012 10:15AM-11:15AM  Family:     Physician:  Dr. Harvie Heck Readling 05/01/2012 10:15AM-11:15AM  Nursing:   Joslyn Devon, RN 05/01/2012 10:15AM -11:15AM   Case Manager:  Ambrose Mantle, LCSW 05/01/2012 10:15AM-11:15AM  Counselor:  Veto Kemps, MT-BC 05/01/2012 10:15AM-11:15AM  Other:      Other:      Other:      Other:       Scribe for Treatment Team:   Sarina Ser, 05/01/2012, 10:15AM-11:15AM
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
## 2012-05-01 NOTE — Progress Notes (Signed)
Psychoeducational Group Note  Date:  05/01/2012 Time:  1100  Group Topic/Focus:  Recovery Goals:   The focus of this group is to identify appropriate goals for recovery and establish a plan to achieve them.  Participation Level:  Active  Participation Quality:  Appropriate  Affect:  Appropriate  Cognitive:  Appropriate  Insight:  Good  Engagement in Group:  Good  Additional Comments:  none  Nickayla Mcinnis M 05/01/2012, 2:07 PM

## 2012-05-01 NOTE — Progress Notes (Signed)
[facility] Group Notes:  (Counselor/Nursing/MHT/Case Management/Adjunct)  05/01/2012 1:34 PM  Type of Therapy:  Group Therapy  Participation Level:  Active  Participation Quality:  Attentive and Sharing  Affect:  Appropriate  Cognitive:  Oriented  Insight:  Good  Engagement in Group:  Good  Engagement in Therapy:  Good  Modes of Intervention:  Education, Problem-solving, Support and Exploration  Summary of Progress/Problems: Patient is eager for discharge. She continues to express disbelief that she got to where she was with her thinking. Wants to keep up her follow-up and her medications.    Brittany Sweeney, [name] 05/01/2012, 1:34 PM

## 2012-05-03 NOTE — Progress Notes (Signed)
Patient Discharge Instructions:  After Visit Summary (AVS):   Faxed to:  05/02/2012 Psychiatric Admission Assessment Note:   Faxed to:  05/02/2012 Suicide Risk Assessment - Discharge Assessment:   Faxed to:  05/02/2012 Faxed/Sent to the Next Level Care provider:  05/02/2012  Faxed to [facility] @ [phone]  Heloise Purpura, Eduard Clos, 05/03/2012, 5:03 PM

## 2012-05-20 NOTE — Discharge Summary (Signed)
Physician Discharge Summary Note  Patient:  Brittany Sweeney is an 51 y.o., female MRN:  [id number] DOB:  Apr 13, 1961 Patient phone:  [phone] (home)  Patient address:   [address] [place] [address]   Date of Admission:  04/25/2012 Date of Discharge: 05/01/2012  Discharge Diagnoses: Principal Problem:  *Schizophrenia, paranoid type  Axis Diagnosis:  Discharge Diagnoses:  AXIS I: Schizophrenia - Paranoid Type.  AXIS II: Deferred.  AXIS III: 1. Cardiomyopathy.  2. Fibromyalgia.  3. Hypertension.  AXIS IV: Chronic Mental Illness. Chronic Non-psychiatric Medical Issues.  AXIS V: GAF at time of admission approximately 35. GAF at time of discharge approximately 60.  Level of Care:  OP  Hospital Course:    While a patient in this hospital, Tiari received medication management for her paranoid delusions. She was enrolled in group counseling sessions and activities in which they participated actively. Her medical problems were treated appropriately including a rash that appeared prior to her admission.  The rash spread to her face. Her discomfort was significant enough to require IM steroid injection to reduce her itching.   The patient's delusional symptoms were treated with Risperdal 2mg  tablets and titrated up to 6mg  a day. Mood stabilization she was given Topamax. She was also tried on Welbutrin as an adjunct to her Zoloft.  Her response to treatment was monitored by daily self inventory to assess her mental and emotional status.  Her paranoia continued and she was unable to have a roommate. She was given a room alone and had no further difficulty.  She was cooperative with the staff and participated in unit programming. Eila was polite to the other patients and not intrusive.   Velna Hatchet attended treatment team meeting this am and met with treatment team members. Pt symptoms, treatment plan and response to treatment discussed. She  endorsed that her symptoms have improved. Pt also  stated that she is stable for discharge.  They reported that from this hospital stay they had learned better coping skills.  In order to maintain her mental health, she will continue psychiatric care on outpatient basis.       Follow-up Information    Follow up with Hospital Discharge Clinic on 05/03/2012. (8:00am appointment )    Contact information:   [facility] [address] [place], [place] Telelphone:  [phone]       Upon discharge, patient adamantly denies suicidal, homicidal ideations, auditory, visual hallucinations and or delusional thinking. They left [facility] with all personal belongings via private transportation in no apparent distress.  Consults:  None  Significant Diagnostic Studies:  None  Discharge Vitals:   Blood pressure 109/77, pulse 105, temperature 98.5 F (36.9 C), temperature source Oral, resp. rate 18, height 5\' 5"  (1.651 m), weight 83.462 kg (184 lb)..  Mental Status Exam: See Mental Status Examination and Suicide Risk Assessment completed by Attending Physician prior to discharge.  Discharge destination:  Home  Is patient on multiple antipsychotic therapies at discharge:  No  Has Patient had three or more failed trials of antipsychotic monotherapy by history: N/A Recommended Plan for Multiple Antipsychotic Therapies: N/A Discharge Orders    Future Orders Please Complete By Expires   Diet - low sodium heart healthy      Increase activity slowly      Discharge instructions      Comments:   Please take all medications only as directed and keep all scheduled follow up appointments.     Medication List  As of 05/20/2012  10:46 PM   STOP taking these medications         zolpidem 10 MG tablet         TAKE these medications      Indication    buPROPion 300 MG 24 hr tablet   Commonly known as: WELLBUTRIN XL   Take 1 tablet (300 mg total) by mouth daily. For depression.       carvedilol 6.25 MG tablet   Commonly known as: COREG   Take 1 tablet  (6.25 mg total) by mouth 2 (two) times daily with a meal. For blood pressure control.       risperiDONE 2 MG tablet   Commonly known as: RISPERDAL   Take 1 tablet (2 mg total) by mouth daily with breakfast. And 2 tablets at bedtime for clarity of thoughts.       sertraline 25 MG tablet   Commonly known as: ZOLOFT   Take 3 tablets (75 mg total) by mouth daily with breakfast. For depression.       topiramate 100 MG tablet   Commonly known as: TOPAMAX   Take 1 tablet (100 mg total) by mouth at bedtime. For migraine headaches.       triamcinolone ointment 0.1 %   Commonly known as: KENALOG   Apply topically 3 (three) times daily. To affected area for rash.            Follow-up Information    Follow up with Hospital Discharge Clinic on 05/03/2012. (8:00am appointment )    Contact information:   [facility] [address] [place], [place] Telelphone:  [phone]        Follow-up recommendations:   Activities: Resume typical activities Diet: Resume typical diet Other: Follow up with outpatient provider and report any side effects to out patient prescriber.  Comments:  Take all your medications as prescribed by your mental healthcare provider. Report any adverse effects and or reactions from your medicines to your outpatient provider promptly. Patient is instructed and cautioned to not engage in alcohol and or illegal drug use while on prescription medicines. In the event of worsening symptoms, patient is instructed to call the crisis hotline, 911 and or go to the nearest ED for appropriate evaluation and treatment of symptoms.  Signed: Rona Ravens. Abdimalik Mayorquin PAC For Dr.Randy D. Readling   05/20/2012 10:46 PM

## 2012-09-04 ENCOUNTER — Ambulatory Visit (INDEPENDENT_AMBULATORY_CARE_PROVIDER_SITE_OTHER): Payer: BC Managed Care – PPO | Admitting: Cardiology

## 2012-09-04 ENCOUNTER — Encounter: Payer: Self-pay | Admitting: Cardiology

## 2012-09-04 VITALS — BP 104/68 | HR 73 | Ht 65.0 in | Wt 195.1 lb

## 2012-09-04 DIAGNOSIS — I10 Essential (primary) hypertension: Secondary | ICD-10-CM

## 2012-09-04 DIAGNOSIS — I429 Cardiomyopathy, unspecified: Secondary | ICD-10-CM

## 2012-09-04 NOTE — Progress Notes (Signed)
HPI Brittany Sweeney returns today for evaluation and management of her secondary cardiomyopathy from hypertension and obesity.  She's been doing well and is still working full-time. Her blood pressures been under good control. Her last echocardiogram a couple years ago showed an ejection fraction of 60% with some diastolic dysfunction. She has mild mitral regurgitation mild left atrial enlargement.  She has no complaints today for a cardiac standpoint. She struggled with depression but is  receiving psychiatric counseling.  Denies any palpitations, presyncope or syncope, orthopnea, PND or edema. She's not taking Lasix except on a rare when necessary basis.  Past Medical History  Diagnosis Date  . Cardiomyopathy   . Fibromyalgia   . Hypertension   . Depression     Current Outpatient Prescriptions  Medication Sig Dispense Refill  . buPROPion (WELLBUTRIN XL) 300 MG 24 hr tablet Take 1 tablet (300 mg total) by mouth daily. For depression.  30 tablet  0  . carvedilol (COREG) 6.25 MG tablet Take 1 tablet (6.25 mg total) by mouth 2 (two) times daily with a meal. For blood pressure control.  60 tablet  0  . risperiDONE (RISPERDAL) 2 MG tablet Take 2 mg by mouth daily. 3/4 of a tablet at night      . topiramate (TOPAMAX) 100 MG tablet Take 100 mg by mouth at bedtime. 150 mg (1 1/2 tablet daily), For migraine headaches.      . [DISCONTINUED] risperiDONE (RISPERDAL) 2 MG tablet Take 1 tablet (2 mg total) by mouth daily with breakfast. And 2 tablets at bedtime for clarity of thoughts.  90 tablet  0  . [DISCONTINUED] topiramate (TOPAMAX) 100 MG tablet Take 1 tablet (100 mg total) by mouth at bedtime. For migraine headaches.  30 tablet  0    Allergies  Allergen Reactions  . Sulfonamide Derivatives     REACTION: swelling and hives    No family history on file.  History   Social History  . Marital Status: Married    Spouse Name: N/A    Number of Children: N/A  . Years of Education: N/A    Occupational History  . Not on file.   Social History Main Topics  . Smoking status: Never Smoker   . Smokeless tobacco: Never Used  . Alcohol Use: No  . Drug Use: No  . Sexually Active: Yes    Birth Control/ Protection: Surgical   Other Topics Concern  . Not on file   Social History Narrative  . No narrative on file    ROS ALL NEGATIVE EXCEPT THOSE NOTED IN HPI  PE  General Appearance: well developed, well nourished in no acute distress, obese HEENT: symmetrical face, PERRLA, good dentition  Neck: no JVD, thyromegaly, or adenopathy, trachea midline Chest: symmetric without deformity Cardiac: PMI difficult to appreciate, RRR, normal S1, S2, no gallop or murmur Lung: clear to ausculation and percussion Vascular: all pulses full without bruits  Abdominal: nondistended, nontender, good bowel sounds, no HSM, no bruits Extremities: no cyanosis, clubbing or edema, no sign of DVT, no varicosities  Skin: normal color, no rashes Neuro: alert and oriented x 3, non-focal Pysch: normal affect  EKG Normal sinus rhythm, normal EKG  BMET    Component Value Date/Time   NA 140 04/30/2012 0613   K 3.9 04/30/2012 0613   CL 105 04/30/2012 0613   CO2 27 04/30/2012 0613   GLUCOSE 90 04/30/2012 0613   BUN 15 04/30/2012 0613   CREATININE 0.79 04/30/2012 0613   CALCIUM 9.4  04/30/2012 0613   GFRNONAA >90 04/30/2012 0613   GFRAA >90 04/30/2012 0613    Lipid Panel     Component Value Date/Time   CHOL 181 07/02/2009   TRIG 207 07/02/2009   HDL 54 07/02/2009   LDLCALC 86 07/02/2009    CBC    Component Value Date/Time   WBC 5.4 04/23/2012 2131   RBC 4.54 04/23/2012 2131   HGB 14.2 04/23/2012 2131   HCT 40.0 04/23/2012 2131   PLT 229 04/23/2012 2131   MCV 88.1 04/23/2012 2131   MCH 31.3 04/23/2012 2131   MCHC 35.5 04/23/2012 2131   RDW 12.4 04/23/2012 2131   LYMPHSABS 2.1 04/23/2012 2131   MONOABS 0.4 04/23/2012 2131   EOSABS 0.1 04/23/2012 2131   BASOSABS 0.0 04/23/2012 2131

## 2012-09-04 NOTE — Assessment & Plan Note (Signed)
Stable clinically. Continue blood pressure control and encourage weight loss.

## 2012-09-04 NOTE — Patient Instructions (Addendum)
Your physician recommends that you schedule a follow-up appointment in: 1 year  

## 2012-10-22 ENCOUNTER — Other Ambulatory Visit (HOSPITAL_COMMUNITY): Payer: Self-pay | Admitting: Internal Medicine

## 2012-10-22 DIAGNOSIS — Z09 Encounter for follow-up examination after completed treatment for conditions other than malignant neoplasm: Secondary | ICD-10-CM

## 2012-10-23 ENCOUNTER — Ambulatory Visit (HOSPITAL_COMMUNITY)
Admission: RE | Admit: 2012-10-23 | Discharge: 2012-10-23 | Disposition: A | Discharge status: Home / Self Care | Payer: BC Managed Care – PPO | Source: Ambulatory Visit | Attending: Internal Medicine | Admitting: Internal Medicine

## 2012-10-23 DIAGNOSIS — Z09 Encounter for follow-up examination after completed treatment for conditions other than malignant neoplasm: Secondary | ICD-10-CM

## 2012-10-23 DIAGNOSIS — Z1231 Encounter for screening mammogram for malignant neoplasm of breast: Secondary | ICD-10-CM | POA: Insufficient documentation

## 2012-12-12 ENCOUNTER — Ambulatory Visit (INDEPENDENT_AMBULATORY_CARE_PROVIDER_SITE_OTHER): Payer: BC Managed Care – PPO | Admitting: Adult Health

## 2012-12-12 ENCOUNTER — Encounter: Payer: Self-pay | Admitting: Adult Health

## 2012-12-12 VITALS — BP 115/76 | HR 68 | Ht 65.0 in | Wt 193.0 lb

## 2012-12-12 DIAGNOSIS — I10 Essential (primary) hypertension: Secondary | ICD-10-CM

## 2012-12-12 DIAGNOSIS — I428 Other cardiomyopathies: Secondary | ICD-10-CM

## 2012-12-12 DIAGNOSIS — I429 Cardiomyopathy, unspecified: Secondary | ICD-10-CM

## 2012-12-12 NOTE — Progress Notes (Signed)
   HPI: Mrs. Licciardi is a 52 y/o patient of Dr.Wall we are seeing for ongoing assessment and treatment of secondary cardiomyopathy, hypertension, with history of fibromyalgia, and depression. She comes today with chest wall pain and pain in her left breast when she lays on her left side. She has some shoulder discomfort on the left, was seen by rheumatologist yesterday with cortisone injection which helped to relieve. She states that she has surgery to her left breast in the past, and has some scar tissue that occasionally bothers her. She denies significant chest pain with exertion or DOE. She has had normal stress tests in the recent past.  Allergies  Allergen Reactions  . Sulfonamide Derivatives     REACTION: swelling and hives    Current Outpatient Prescriptions  Medication Sig Dispense Refill  . buPROPion (WELLBUTRIN XL) 300 MG 24 hr tablet Take 1 tablet (300 mg total) by mouth daily. For depression.  30 tablet  0  . carvedilol (COREG) 6.25 MG tablet Take 1 tablet (6.25 mg total) by mouth 2 (two) times daily with a meal. For blood pressure control.  60 tablet  0  . risperiDONE (RISPERDAL) 2 MG tablet Take 2 mg by mouth daily. 3/4 of a tablet at night      . topiramate (TOPAMAX) 100 MG tablet Take 100 mg by mouth at bedtime. 150 mg (1 1/2 tablet daily), For migraine headaches.       No current facility-administered medications for this visit.    Past Medical History  Diagnosis Date  . Cardiomyopathy   . Fibromyalgia   . Hypertension   . Depression     Past Surgical History  Procedure Laterality Date  . Abdominal hysterectomy      ZOX:WRUEAV of systems complete and found to be negative unless listed above  PHYSICAL EXAM BP 115/76  Pulse 68  Ht 5\' 5"  (1.651 m)  Wt 193 lb (87.544 kg)  BMI 32.12 kg/m2  General: Well developed, well nourished, in no acute distress Head: Eyes PERRLA, No xanthomas.   Normal cephalic and atramatic  Lungs: Clear bilaterally to auscultation and  percussion. Heart: HRRR S1 S2, without MRG.  Pulses are 2+ & equal.            No carotid bruit. No JVD.  No abdominal bruits. No femoral bruits. Abdomen: Bowel sounds are positive, abdomen soft and non-tender without masses or                  Hernia's noted. Msk:  Back normal, normal gait. Normal strength and tone for age. Some discomfort reproducible with palpation of left chest. Extremities: No clubbing, cyanosis or edema.  DP +1 Neuro: Alert and oriented X 3. Psych:  Good affect, responds appropriately  EKG:NSR rate of 81 bpm.  ASSESSMENT AND PLAN

## 2012-12-12 NOTE — Progress Notes (Deleted)
Name: Brittany Sweeney    DOB: [birth year]  Age: 52 y.o.  MR#: [id number]       PCP:  Carylon Perches, MD      Insurance: Payor: BLUE CROSS BLUE [employer]IELD  Plan: BCBS PPO OUT OF STATE  Product Type: *No Product type*    CC:   No chief complaint on file.   VS Filed Vitals:   12/12/12 1529  BP: 115/76  Pulse: 68  Height: 5\' 5"  (1.651 m)  Weight: 193 lb (87.544 kg)    Weights Current Weight  12/12/12 193 lb (87.544 kg)  09/04/12 195 lb 1.9 oz (88.506 kg)  04/25/12 184 lb (83.462 kg)    Blood Pressure  BP Readings from Last 3 Encounters:  12/12/12 115/76  09/04/12 104/68  05/01/12 109/77     Admit date:  (Not on file) Last encounter with RMR:  Visit date not found   Allergy Sulfonamide derivatives  Current Outpatient Prescriptions  Medication Sig Dispense Refill  . buPROPion (WELLBUTRIN XL) 300 MG 24 hr tablet Take 1 tablet (300 mg total) by mouth daily. For depression.  30 tablet  0  . carvedilol (COREG) 6.25 MG tablet Take 1 tablet (6.25 mg total) by mouth 2 (two) times daily with a meal. For blood pressure control.  60 tablet  0  . risperiDONE (RISPERDAL) 2 MG tablet Take 2 mg by mouth daily. 3/4 of a tablet at night      . topiramate (TOPAMAX) 100 MG tablet Take 100 mg by mouth at bedtime. 150 mg (1 1/2 tablet daily), For migraine headaches.       No current facility-administered medications for this visit.    Discontinued Meds:   There are no discontinued medications.  Patient Active Problem List  Diagnosis  . OVERWEIGHT/OBESITY  . HYPERTENSION, UNSPECIFIED  . CARDIOMYOPATHY, SECONDARY  . DEPRESSION  . Schizophrenia, paranoid type    LABS @BMET3 @  @CMPRESULT3 @ @CBC3 @  Lipid Panel     Component Value Date/Time   CHOL 181 07/02/2009   TRIG 207 07/02/2009   HDL 54 07/02/2009   LDLCALC 86 07/02/2009    ABG No results found for this basename: phart, pco2, pco2art, po2, po2art, hco3, tco2, acidbasedef, o2sat     BNP (last 3 results) No results found for this basename:  PROBNP,  in the last 8760 hours Cardiac Panel (last 3 results) No results found for this basename: CKTOTAL, CKMB, TROPONINI, RELINDX,  in the last 72 hours  Iron/TIBC/Ferritin No results found for this basename: iron, tibc, ferritin     EKG Orders placed in visit on 12/12/12  . EKG 12-LEAD     Prior Assessment and Plan Problem List as of 12/12/2012     ICD-9-CM     Cardiology Problems   HYPERTENSION, UNSPECIFIED   Last Assessment & Plan   05/30/2011 Office Visit Written 05/30/2011  3:44 PM by Jodelle Gross, NP     Currently well controlled on medical regimen. She has mild Left leg lower edema. She can continue to take lasix prn for this if it becomes progressive.  She is to watch salt intake.    CARDIOMYOPATHY, SECONDARY   Last Assessment & Plan   09/04/2012 Office Visit Written 09/04/2012  3:57 PM by Gaylord Shih, MD     Stable clinically. Continue blood pressure control and encourage weight loss.      Other   OVERWEIGHT/OBESITY   DEPRESSION   Schizophrenia, paranoid type       Imaging: No results  found.

## 2012-12-12 NOTE — Assessment & Plan Note (Signed)
Well controlled at present. Will continue current medication regimen.

## 2012-12-12 NOTE — Patient Instructions (Addendum)
Your physician recommends that you schedule a follow-up appointment in: Keep follow up in November

## 2012-12-12 NOTE — Assessment & Plan Note (Signed)
Chest discomfort appears to be more chest wall pain. It is reproducible to palpation. She has large pedilous breasts. I have recommended that she speak to her PCP about possible breast reduction surgery should she continue shoulder pain. No further cardiac work-up is recommended.

## 2013-02-21 ENCOUNTER — Telehealth: Payer: Self-pay | Admitting: Adult Health

## 2013-02-21 MED ORDER — CARVEDILOL 6.25 MG PO TABS: 6.2500 mg | ORAL_TABLET | Freq: Two times a day (BID) | ORAL | Status: DC

## 2013-02-21 NOTE — Telephone Encounter (Signed)
Patient needs refill on Carvedilol 12.5mg  sent to [facility] in [place]. /t gs

## 2013-02-21 NOTE — Telephone Encounter (Signed)
rx sent to pharmacy by e-script  

## 2013-07-17 ENCOUNTER — Encounter: Payer: Self-pay | Admitting: Cardiology

## 2013-08-18 ENCOUNTER — Other Ambulatory Visit: Payer: Self-pay | Admitting: Adult Health

## 2013-09-03 ENCOUNTER — Other Ambulatory Visit: Payer: Self-pay | Admitting: Specialist

## 2013-09-03 DIAGNOSIS — I4901 Ventricular fibrillation: Secondary | ICD-10-CM

## 2013-09-04 ENCOUNTER — Ambulatory Visit: Payer: Self-pay | Admitting: Cardiology

## 2013-09-18 ENCOUNTER — Ambulatory Visit (INDEPENDENT_AMBULATORY_CARE_PROVIDER_SITE_OTHER): Payer: BC Managed Care – PPO | Admitting: Cardiology

## 2013-09-18 ENCOUNTER — Encounter: Payer: Self-pay | Admitting: Cardiology

## 2013-09-18 VITALS — BP 114/80 | HR 71 | Ht 65.0 in | Wt 191.0 lb

## 2013-09-18 DIAGNOSIS — I10 Essential (primary) hypertension: Secondary | ICD-10-CM

## 2013-09-18 NOTE — Patient Instructions (Addendum)
Your physician wants you to follow-up in: ONE YEAR You will receive a reminder letter in the mail two months in advance. If you don't receive a letter, please call our office to schedule the follow-up appointment.  

## 2013-09-18 NOTE — Progress Notes (Signed)
Clinical Summary Ms. Givhan is a 52 y.o.female former patient of Dr Daleen Squibb. This is our first visit together, she was seen for the following problems.  1. Cardiomyopathy - per previous history, details are unclear. Echoes as far back as 2004 and 2005 describe normal function, she reports history of heart troubles in the 1990s. - no SOB or DOE, fairly sedentary lifestyle, highest level of activity is walking around at work and at home without troubles. - no orthopnea, no PND, occas LE eema  2. HTN - does not check at home regularly - compliant with meds     Past Medical History  Diagnosis Date  . Cardiomyopathy   . Fibromyalgia   . Hypertension   . Depression      Allergies  Allergen Reactions  . Sulfonamide Derivatives     REACTION: swelling and hives     Current Outpatient Prescriptions  Medication Sig Dispense Refill  . buPROPion (WELLBUTRIN XL) 300 MG 24 hr tablet Take 1 tablet (300 mg total) by mouth daily. For depression.  30 tablet  0  . carvedilol (COREG) 6.25 MG tablet TAKE 1 TABLET BY MOUTH TWICE A DAY FOR BLOOD PRESSURE CONTROL. TAKE WITH A MEAL  60 tablet  5  . risperiDONE (RISPERDAL) 2 MG tablet Take 2 mg by mouth daily. 3/4 of a tablet at night      . topiramate (TOPAMAX) 100 MG tablet Take 100 mg by mouth at bedtime. 150 mg (1 1/2 tablet daily), For migraine headaches.       No current facility-administered medications for this visit.     Past Surgical History  Procedure Laterality Date  . Abdominal hysterectomy       Allergies  Allergen Reactions  . Sulfonamide Derivatives     REACTION: swelling and hives      No family history on file.   Social History Ms. Thoennes reports that she has never smoked. She has never used smokeless tobacco. Ms. Sabina reports that she does not drink alcohol.   Review of Systems CONSTITUTIONAL: No weight loss, fever, chills, weakness or fatigue.  HEENT: Eyes: No visual loss, blurred vision, double  vision or yellow sclerae.No hearing loss, sneezing, congestion, runny nose or sore throat.  SKIN: No rash or itching.  CARDIOVASCULAR: per HPI RESPIRATORY: per HPI  GASTROINTESTINAL: No anorexia, nausea, vomiting or diarrhea. No abdominal pain or blood.  GENITOURINARY: No burning on urination, no polyuria NEUROLOGICAL: No headache, dizziness, syncope, paralysis, ataxia, numbness or tingling in the extremities. No change in bowel or bladder control.  MUSCULOSKELETAL: No muscle, back pain, joint pain or stiffness.  LYMPHATICS: No enlarged nodes. No history of splenectomy.  PSYCHIATRIC: No history of depression or anxiety.  ENDOCRINOLOGIC: No reports of sweating, cold or heat intolerance. No polyuria or polydipsia.  [name]   Physical Examination p 71 bp 114/80 Wt 191 lbs BMI 32 Gen: resting comfortably, no acute distress HEENT: no scleral icterus, pupils equal round and reactive, no palptable cervical adenopathy,  CV: RRR, no m/r/g, no JVD, no carotid bruits Resp: Clear to auscultation bilaterally GI: abdomen is soft, non-tender, non-distended, normal bowel sounds, no hepatosplenomegaly MSK: extremities are warm, no edema.  Skin: warm, no rash Neuro:  no focal deficits Psych: appropriate affect    Assessment and Plan  1. Cardiomyopathy - unclear history, seems to have occurred in the 1990s. Recent echoes have shown normal cardiac function, she denies any significant symptoms - continue to follow clinically.  2. HTN - bp  at goal, continue current meds   F/u 1 year   Antoine Poche, M.D., F.A.C.C.

## 2013-09-27 ENCOUNTER — Other Ambulatory Visit (HOSPITAL_COMMUNITY): Payer: Self-pay | Admitting: Internal Medicine

## 2013-09-27 DIAGNOSIS — Z139 Encounter for screening, unspecified: Secondary | ICD-10-CM

## 2013-10-25 ENCOUNTER — Ambulatory Visit (HOSPITAL_COMMUNITY)
Admission: RE | Admit: 2013-10-25 | Discharge: 2013-10-25 | Disposition: A | Discharge status: Home / Self Care | Payer: BC Managed Care – PPO | Source: Ambulatory Visit | Attending: Internal Medicine | Admitting: Internal Medicine

## 2013-10-25 DIAGNOSIS — Z1231 Encounter for screening mammogram for malignant neoplasm of breast: Secondary | ICD-10-CM | POA: Insufficient documentation

## 2013-10-25 DIAGNOSIS — Z139 Encounter for screening, unspecified: Secondary | ICD-10-CM

## 2013-11-06 ENCOUNTER — Encounter: Payer: Self-pay | Admitting: Cardiology

## 2014-02-10 ENCOUNTER — Other Ambulatory Visit: Payer: Self-pay | Admitting: Adult Health

## 2014-08-04 ENCOUNTER — Encounter: Payer: Self-pay | Admitting: Cardiology

## 2014-09-04 ENCOUNTER — Other Ambulatory Visit: Payer: Self-pay | Admitting: Adult Health

## 2014-09-17 ENCOUNTER — Ambulatory Visit: Payer: Self-pay | Admitting: Cardiology

## 2014-10-01 ENCOUNTER — Other Ambulatory Visit: Payer: Self-pay | Admitting: Cardiology

## 2014-10-01 MED ORDER — CARVEDILOL 6.25 MG PO TABS: 6.2500 mg | ORAL_TABLET | Freq: Two times a day (BID) | ORAL | Status: DC

## 2014-10-01 NOTE — Telephone Encounter (Signed)
Received fax refill request  Rx # [id number] Medication:  Carvedilol 6.25 mg tablet Qty 60 Sig:  Take one tablet by mouth twice a day with food for blood pressure  Physician:  Wall

## 2014-10-01 NOTE — Telephone Encounter (Signed)
Refill complete 

## 2014-10-03 ENCOUNTER — Ambulatory Visit: Payer: Self-pay | Admitting: Cardiology

## 2014-10-07 ENCOUNTER — Ambulatory Visit (INDEPENDENT_AMBULATORY_CARE_PROVIDER_SITE_OTHER): Payer: BC Managed Care – PPO | Admitting: Cardiology

## 2014-10-07 ENCOUNTER — Encounter: Payer: Self-pay | Admitting: Cardiology

## 2014-10-07 VITALS — BP 103/68 | HR 67 | Ht 65.0 in | Wt 203.0 lb

## 2014-10-07 DIAGNOSIS — I10 Essential (primary) hypertension: Secondary | ICD-10-CM

## 2014-10-07 DIAGNOSIS — I429 Cardiomyopathy, unspecified: Secondary | ICD-10-CM

## 2014-10-07 NOTE — Patient Instructions (Signed)
Your physician wants you to follow-up in: 1 year. You will receive a reminder letter in the mail two months in advance. If you don't receive a letter, please call our office to schedule the follow-up appointment.  Your physician recommends that you continue on your current medications as directed. Please refer to the Current Medication list given to you today.  Thank you for choosing  [facility]!!    

## 2014-10-07 NOTE — Progress Notes (Signed)
     Clinical Summary Brittany Sweeney is a 53 y.o.female seen today for follow up of the following medical problems.   1. Cardiomyopathy - per previous history, details are unclear. Echoes as far back as 2004 and 2005 describe normal function, she reports history of heart troubles in the 1990s. - denies any SOB or DOE.No orthopnea, no PND, no LE edema - no chest pain, no palpitations  2. HTN - does not check at home regularly - compliant with meds    Past Medical History  Diagnosis Date  . Cardiomyopathy   . Fibromyalgia   . Hypertension   . Depression      Allergies  Allergen Reactions  . Sulfonamide Derivatives     REACTION: swelling and hives     Current Outpatient Prescriptions  Medication Sig Dispense Refill  . buPROPion (WELLBUTRIN XL) 300 MG 24 hr tablet Take 1 tablet (300 mg total) by mouth daily. For depression. 30 tablet 0  . carvedilol (COREG) 6.25 MG tablet Take 1 tablet (6.25 mg total) by mouth 2 (two) times daily with a meal. 60 tablet 11  . risperiDONE (RISPERDAL) 2 MG tablet Take 2 mg by mouth daily. 3/4 of a tablet at night    . topiramate (TOPAMAX) 100 MG tablet Take 100 mg by mouth at bedtime. 150 mg (1 1/2 tablet daily), For migraine headaches.     No current facility-administered medications for this visit.     Past Surgical History  Procedure Laterality Date  . Abdominal hysterectomy       Allergies  Allergen Reactions  . Sulfonamide Derivatives     REACTION: swelling and hives      No family history on file.   Social History Brittany Sweeney reports that she has never smoked. She has never used smokeless tobacco. Brittany Sweeney reports that she does not drink alcohol.   Review of Systems CONSTITUTIONAL: No weight loss, fever, chills, weakness or fatigue.  HEENT: Eyes: No visual loss, blurred vision, double vision or yellow sclerae.No hearing loss, sneezing, congestion, runny nose or sore throat.  SKIN: No rash or itching.    CARDIOVASCULAR: per HPI RESPIRATORY: No shortness of breath, cough or sputum.  GASTROINTESTINAL: No anorexia, nausea, vomiting or diarrhea. No abdominal pain or blood.  GENITOURINARY: No burning on urination, no polyuria NEUROLOGICAL: No headache, dizziness, syncope, paralysis, ataxia, numbness or tingling in the extremities. No change in bowel or bladder control.  MUSCULOSKELETAL: No muscle, back pain, joint pain or stiffness.  LYMPHATICS: No enlarged nodes. No history of splenectomy.  PSYCHIATRIC: No history of depression or anxiety.  ENDOCRINOLOGIC: No reports of sweating, cold or heat intolerance. No polyuria or polydipsia.  [name]   Physical Examination p 67 bp 103/68 Wt 203 lbs BMI 34 Gen: resting comfortably, no acute distress HEENT: no scleral icterus, pupils equal round and reactive, no palptable cervical adenopathy,  CV: RRR, no m/r/g, no JVD, no carotid bruits Resp: Clear to auscultation bilaterally GI: abdomen is soft, non-tender, non-distended, normal bowel sounds, no hepatosplenomegaly MSK: extremities are warm, no edema.  Skin: warm, no rash Neuro:  no focal deficits Psych: appropriate affect     Assessment and Plan   1. Cardiomyopathy - remote history, seems to have occurred in the 1990s. Recent echoes have shown normal cardiac function, she denies any significant symptoms - continue to follow clinically.  2. HTN - bp at goal, continue current meds    F/u 1 year   Arnoldo Lenis, M.D.

## 2015-04-29 ENCOUNTER — Other Ambulatory Visit (HOSPITAL_COMMUNITY): Payer: Self-pay | Admitting: Internal Medicine

## 2015-04-29 DIAGNOSIS — Z1231 Encounter for screening mammogram for malignant neoplasm of breast: Secondary | ICD-10-CM

## 2015-04-30 ENCOUNTER — Ambulatory Visit (HOSPITAL_COMMUNITY)
Admission: RE | Admit: 2015-04-30 | Discharge: 2015-04-30 | Disposition: A | Discharge status: Home / Self Care | Payer: BLUE CROSS/BLUE SHIELD | Source: Ambulatory Visit | Attending: Internal Medicine | Admitting: Internal Medicine

## 2015-04-30 DIAGNOSIS — Z1231 Encounter for screening mammogram for malignant neoplasm of breast: Secondary | ICD-10-CM | POA: Diagnosis not present

## 2015-09-28 ENCOUNTER — Other Ambulatory Visit: Payer: Self-pay

## 2015-09-28 MED ORDER — CARVEDILOL 6.25 MG PO TABS: 6.2500 mg | ORAL_TABLET | Freq: Two times a day (BID) | ORAL | Status: DC

## 2015-09-28 NOTE — Telephone Encounter (Signed)
1 month coreg refilled,needs apt,is in recall list for [facility] [place] office

## 2015-10-12 ENCOUNTER — Ambulatory Visit (INDEPENDENT_AMBULATORY_CARE_PROVIDER_SITE_OTHER): Payer: BLUE CROSS/BLUE SHIELD | Admitting: Cardiology

## 2015-10-12 ENCOUNTER — Encounter: Payer: Self-pay | Admitting: Cardiology

## 2015-10-12 VITALS — BP 108/64 | HR 74 | Ht 65.0 in | Wt 204.0 lb

## 2015-10-12 DIAGNOSIS — I10 Essential (primary) hypertension: Secondary | ICD-10-CM

## 2015-10-12 DIAGNOSIS — I429 Cardiomyopathy, unspecified: Secondary | ICD-10-CM | POA: Diagnosis not present

## 2015-10-12 NOTE — Progress Notes (Signed)
Patient ID: Brittany Sweeney, female   DOB: [birth year], 54 y.o.   MRN: [id number]     Clinical Summary Brittany Sweeney is a 54 y.o.female seen today for follow up of the following medical problems.   1. Cardiomyopathy - per previous history, details are unclear. Echoes as far back as 2004 and 2005 describe normal function, she reports history of heart troubles in the 1990s. - denies any recent chest pain, palpitatoins, SOB or DOE. No LE edema or orthopnea  2. HTN - does not check at home - compliant with meds    Past Medical History  Diagnosis Date  . Cardiomyopathy   . Fibromyalgia   . Hypertension   . Depression      Allergies  Allergen Reactions  . Sulfonamide Derivatives     REACTION: swelling and hives     Current Outpatient Prescriptions  Medication Sig Dispense Refill  . buPROPion (WELLBUTRIN XL) 300 MG 24 hr tablet Take 1 tablet (300 mg total) by mouth daily. For depression. 30 tablet 0  . carvedilol (COREG) 6.25 MG tablet Take 1 tablet (6.25 mg total) by mouth 2 (two) times daily with a meal. 60 tablet 0  . gabapentin (NEURONTIN) 100 MG capsule Take 100 mg by mouth at bedtime.    . risperiDONE (RISPERDAL) 3 MG tablet Take 0.5 tablets by mouth at bedtime.    . topiramate (TOPAMAX) 100 MG tablet Take 100 mg by mouth at bedtime.      No current facility-administered medications for this visit.     Past Surgical History  Procedure Laterality Date  . Abdominal hysterectomy       Allergies  Allergen Reactions  . Sulfonamide Derivatives     REACTION: swelling and hives      No family history on file.   Social History Brittany Sweeney reports that she has never smoked. She has never used smokeless tobacco. Brittany Sweeney reports that she does not drink alcohol.   Review of Systems CONSTITUTIONAL: No weight loss, fever, chills, weakness or fatigue.  HEENT: Eyes: No visual loss, blurred vision, double vision or yellow sclerae.No hearing loss, sneezing,  congestion, runny nose or sore throat.  SKIN: No rash or itching.  CARDIOVASCULAR: per hpi RESPIRATORY: No shortness of breath, cough or sputum.  GASTROINTESTINAL: No anorexia, nausea, vomiting or diarrhea. No abdominal pain or blood.  GENITOURINARY: No burning on urination, no polyuria NEUROLOGICAL: No headache, dizziness, syncope, paralysis, ataxia, numbness or tingling in the extremities. No change in bowel or bladder control.  MUSCULOSKELETAL: No muscle, back pain, joint pain or stiffness.  LYMPHATICS: No enlarged nodes. No history of splenectomy.  PSYCHIATRIC: No history of depression or anxiety.  ENDOCRINOLOGIC: No reports of sweating, cold or heat intolerance. No polyuria or polydipsia.  Brittany Sweeney   Physical Examination Filed Vitals:   10/12/15 1442  BP: 108/64  Pulse: 74   Filed Vitals:   10/12/15 1442  Height: 5\' 5"  (1.651 m)  Weight: 204 lb (92.534 kg)    Gen: resting comfortably, no acute distress HEENT: no scleral icterus, pupils equal round and reactive, no palptable cervical adenopathy,  CV: RRR, no m/r/g, no jvd Resp: Clear to auscultation bilaterally GI: abdomen is soft, non-tender, non-distended, normal bowel sounds, no hepatosplenomegaly MSK: extremities are warm, no edema.  Skin: warm, no rash Neuro:  no focal deficits Psych: appropriate affect   Diagnostic Studies  Jan 2010 echo SUMMARY - Overall left ventricular systolic function was normal. Left   ventricular ejection fraction was estimated ,  range being 55   % to 60 %. There was no diagnostic evidence of left   ventricular regional wall motion abnormalities. Left   ventricular wall thickness was at the upper limits of normal.   Features were consistent with mild diastolic dysfunction. - There was mild mitral valvular regurgitation. The effective   orifice of mitral regurgitation by proximal isovelocity   surface area was 0.04 cm^2. The volume of mitral   regurgitation  by proximal isovelocity surface area was 7 cc. - Left atrial size was at the upper limits of normal.   Assessment and Plan  1. Cardiomyopathy - remote history, seems to have occurred in the 1990s.  - Recent echoes have shown function has normalized - no current symptoms, continue to monitor  2. HTN - bp at goal, we will conitnue her coreg   F/u 2 years       Arnoldo Lenis, M.D.,

## 2015-10-12 NOTE — Patient Instructions (Signed)
Medication Instructions:  Your physician recommends that you continue on your current medications as directed. Please refer to the Current Medication list given to you today.   Labwork: WE WILL REQUEST A COPY OF LAB WORK FROM YOUR PCP  Testing/Procedures: NONE  Follow-Up: Your physician wants you to follow-up in: 2 YEARS WITH DR. BRANCH. You will receive a reminder letter in the mail two months in advance. If you don't receive a letter, please call our office to schedule the follow-up appointment.   Any Other Special Instructions Will Be Listed Below (If Applicable).     If you need a refill on your cardiac medications before your next appointment, please call your pharmacy. Thanks for choosing [facility]!!!

## 2015-10-28 ENCOUNTER — Other Ambulatory Visit: Payer: Self-pay

## 2015-10-28 MED ORDER — CARVEDILOL 6.25 MG PO TABS: 6.2500 mg | ORAL_TABLET | Freq: Two times a day (BID) | ORAL | Status: DC

## 2016-02-16 ENCOUNTER — Encounter: Payer: Self-pay | Admitting: Obstetrics & Gynecology

## 2016-02-16 ENCOUNTER — Ambulatory Visit (INDEPENDENT_AMBULATORY_CARE_PROVIDER_SITE_OTHER): Payer: BLUE CROSS/BLUE SHIELD | Admitting: Obstetrics & Gynecology

## 2016-02-16 VITALS — BP 120/70 | HR 70 | Ht 65.0 in | Wt 211.0 lb

## 2016-02-16 DIAGNOSIS — B3731 Acute candidiasis of vulva and vagina: Secondary | ICD-10-CM

## 2016-02-16 DIAGNOSIS — B373 Candidiasis of vulva and vagina: Secondary | ICD-10-CM | POA: Diagnosis not present

## 2016-02-16 MED ORDER — FLUCONAZOLE 100 MG PO TABS: 100.0000 mg | ORAL_TABLET | Freq: Every day | ORAL | Status: DC

## 2016-02-16 MED ORDER — NYSTATIN-TRIAMCINOLONE 100000-0.1 UNIT/GM-% EX OINT: 1.0000 "application " | TOPICAL_OINTMENT | Freq: Two times a day (BID) | CUTANEOUS | Status: DC

## 2016-02-16 NOTE — Progress Notes (Signed)
Patient ID: Brittany Sweeney, female   DOB: [birth year], 55 y.o.   MRN: [id number]      Chief Complaint  Patient presents with  . new gyn visit    rash pubic area    Blood pressure 120/70, pulse 70, height 5\' 5"  (1.651 m), weight 211 lb (95.709 kg).  55 y.o. No obstetric history on file. No LMP recorded. Patient has had a hysterectomy. The current method of family planning is hysterectomy.  Subjective Pt with long standing rash and itching between her legs  Objective intertrigenous yeast both legs and vulva Vulva:  Erythema consistent with yeast Vagina:  +yeast Cervix:  absent Uterus:  uterus absent Adnexa: ovaries:,    Treated with gentian violet   Pertinent ROS No burning with urination, frequency or urgency No nausea, vomiting or diarrhea Nor fever chills or other constitutional symptoms   Labs or studies Hemoglobin A1C 06/2015: 5.5    Impression Diagnoses this Encounter::   ICD-9-CM ICD-10-CM   1. Candidal vulvovaginitis 112.1 B37.3     Established relevant diagnosis(es):   Plan/Recommendations: Meds ordered this encounter  Medications  . fluconazole (DIFLUCAN) 100 MG tablet    Sig: Take 1 tablet (100 mg total) by mouth daily.    Dispense:  10 tablet    Refill:  0  . nystatin-triamcinolone ointment (MYCOLOG)    Sig: Apply 1 application topically 2 (two) times daily.    Dispense:  30 g    Refill:  11    Labs or Scans Ordered: No orders of the defined types were placed in this encounter.    Management::   Follow up Return in about 2 weeks (around 03/01/2016) for Follow up, with Dr Elonda Husky.       All questions were answered.

## 2016-02-26 ENCOUNTER — Encounter: Payer: Self-pay | Admitting: Obstetrics and Gynecology

## 2016-03-02 ENCOUNTER — Ambulatory Visit: Payer: BLUE CROSS/BLUE SHIELD | Admitting: Obstetrics & Gynecology

## 2016-03-03 ENCOUNTER — Ambulatory Visit (INDEPENDENT_AMBULATORY_CARE_PROVIDER_SITE_OTHER): Payer: BLUE CROSS/BLUE SHIELD | Admitting: Obstetrics & Gynecology

## 2016-03-03 ENCOUNTER — Encounter: Payer: Self-pay | Admitting: Obstetrics & Gynecology

## 2016-03-03 VITALS — BP 120/70 | HR 78 | Ht 65.0 in | Wt 209.0 lb

## 2016-03-03 DIAGNOSIS — B373 Candidiasis of vulva and vagina: Secondary | ICD-10-CM

## 2016-03-03 DIAGNOSIS — K641 Second degree hemorrhoids: Secondary | ICD-10-CM

## 2016-03-03 DIAGNOSIS — B3731 Acute candidiasis of vulva and vagina: Secondary | ICD-10-CM

## 2016-03-03 NOTE — Progress Notes (Signed)
Patient ID: Brittany Sweeney, female   DOB: Aug 22, 1961, 55 y.o.   MRN: [id number]      Chief Complaint  Patient presents with  . Follow-up    Blood pressure 120/70, pulse 78, height 5\' 5"  (1.651 m), weight 209 lb (94.802 kg).  55 y.o. No obstetric history on file. No LMP recorded. Patient has had a hysterectomy. The current method of family planning is status post hysterectomy.  Subjective Here for follow-up He was seen 3 weeks ago and found to have a extensive yeast vulvovaginitis Her hemoglobin A1c and the recent past was normal Today she states her burning and irritation is much improved and essentially resolved she is complaining of her hemorrhoids which are chronic  Objective Resolution of the yeast associated changes of the vulva and mons pubis Grade 2 groins noted non-thrombosed Normal external genitalia Vagina is pink moist no discharge no yeast with an intact cuff  Pertinent ROS No burning with urination, frequency or urgency No nausea, vomiting or diarrhea Nor fever chills or other constitutional symptoms   Labs or studies none    Impression Diagnoses this Encounter::   ICD-9-CM ICD-10-CM   1. Candidal vulvovaginitis 112.1 B37.3   2. Second degree hemorrhoids 455.8 K64.1     Established relevant diagnosis(es):   Plan/Recommendations: No orders of the defined types were placed in this encounter.    Labs or Scans Ordered: No orders of the defined types were placed in this encounter.    Management::   Follow up Return if symptoms worsen or fail to improve, for Follow up, with Dr Elonda Husky.         All questions were answered.

## 2016-03-10 DIAGNOSIS — M7071 Other bursitis of hip, right hip: Secondary | ICD-10-CM | POA: Diagnosis not present

## 2016-03-10 DIAGNOSIS — M25561 Pain in right knee: Secondary | ICD-10-CM | POA: Diagnosis not present

## 2016-03-10 DIAGNOSIS — M797 Fibromyalgia: Secondary | ICD-10-CM | POA: Diagnosis not present

## 2016-03-10 DIAGNOSIS — M3509 Sicca syndrome with other organ involvement: Secondary | ICD-10-CM | POA: Diagnosis not present

## 2016-03-18 DIAGNOSIS — F333 Major depressive disorder, recurrent, severe with psychotic symptoms: Secondary | ICD-10-CM | POA: Diagnosis not present

## 2016-04-12 DIAGNOSIS — Z6834 Body mass index (BMI) 34.0-34.9, adult: Secondary | ICD-10-CM | POA: Diagnosis not present

## 2016-04-12 DIAGNOSIS — I427 Cardiomyopathy due to drug and external agent: Secondary | ICD-10-CM | POA: Diagnosis not present

## 2016-04-12 DIAGNOSIS — M797 Fibromyalgia: Secondary | ICD-10-CM | POA: Diagnosis not present

## 2016-04-12 DIAGNOSIS — Z0001 Encounter for general adult medical examination with abnormal findings: Secondary | ICD-10-CM | POA: Diagnosis not present

## 2016-04-27 DIAGNOSIS — R109 Unspecified abdominal pain: Secondary | ICD-10-CM | POA: Diagnosis not present

## 2016-05-05 DIAGNOSIS — K8020 Calculus of gallbladder without cholecystitis without obstruction: Secondary | ICD-10-CM | POA: Diagnosis not present

## 2016-05-05 DIAGNOSIS — N281 Cyst of kidney, acquired: Secondary | ICD-10-CM | POA: Diagnosis not present

## 2016-05-05 DIAGNOSIS — R109 Unspecified abdominal pain: Secondary | ICD-10-CM | POA: Diagnosis not present

## 2016-05-10 DIAGNOSIS — R109 Unspecified abdominal pain: Secondary | ICD-10-CM | POA: Diagnosis not present

## 2016-05-10 DIAGNOSIS — K8020 Calculus of gallbladder without cholecystitis without obstruction: Secondary | ICD-10-CM | POA: Diagnosis not present

## 2016-05-10 DIAGNOSIS — N281 Cyst of kidney, acquired: Secondary | ICD-10-CM | POA: Diagnosis not present

## 2016-05-21 ENCOUNTER — Other Ambulatory Visit: Payer: Self-pay | Admitting: Cardiology

## 2016-06-21 DIAGNOSIS — K8020 Calculus of gallbladder without cholecystitis without obstruction: Secondary | ICD-10-CM | POA: Diagnosis not present

## 2016-09-02 DIAGNOSIS — F333 Major depressive disorder, recurrent, severe with psychotic symptoms: Secondary | ICD-10-CM | POA: Diagnosis not present

## 2016-09-22 DIAGNOSIS — G43909 Migraine, unspecified, not intractable, without status migrainosus: Secondary | ICD-10-CM | POA: Insufficient documentation

## 2016-09-22 DIAGNOSIS — M797 Fibromyalgia: Secondary | ICD-10-CM | POA: Insufficient documentation

## 2016-09-22 DIAGNOSIS — M19042 Primary osteoarthritis, left hand: Secondary | ICD-10-CM

## 2016-09-22 DIAGNOSIS — M19041 Primary osteoarthritis, right hand: Secondary | ICD-10-CM | POA: Insufficient documentation

## 2016-09-22 DIAGNOSIS — G894 Chronic pain syndrome: Secondary | ICD-10-CM | POA: Insufficient documentation

## 2016-09-22 DIAGNOSIS — M3500 Sicca syndrome, unspecified: Secondary | ICD-10-CM | POA: Insufficient documentation

## 2016-09-22 NOTE — Progress Notes (Signed)
Office Visit Note  Patient: Brittany Sweeney             Date of Birth: Apr 09, 1961           MRN: [id number]             PCP: Rosita Fire, MD Referring: Rosita Fire, MD Visit Date: 09/23/2016 Occupation: Manufacturing associate    Subjective:  Knee pain.   History of Present Illness: Brittany Sweeney is a 55 y.o. female she continues to have some discomfort from fibromyalgia. She describes pain in her bilateral trochanteric area. She also has pain in her bilateral knee joints. She rates her pain level on 0-10 anywhere from 2-7, fatigue at about 4. She has noticed a knot in her left hand.  Activities of Daily Living:  Patient reports morning stiffness for 20 minutes.   Patient Reports nocturnal pain.  Difficulty dressing/grooming: Denies Difficulty climbing stairs: Denies Difficulty getting out of chair: Denies Difficulty using hands for taps, buttons, cutlery, and/or writing: Denies   Review of Systems  Constitutional: Positive for fatigue. Negative for night sweats, weight gain, weight loss and weakness.  HENT: Negative for mouth sores, trouble swallowing, trouble swallowing, mouth dryness and nose dryness.   Eyes: Positive for dryness. Negative for pain, redness and visual disturbance.  Respiratory: Negative for cough, shortness of breath and difficulty breathing.   Cardiovascular: Negative for chest pain, palpitations, hypertension, irregular heartbeat and swelling in legs/feet.  Gastrointestinal: Negative for blood in stool, constipation and diarrhea.  Endocrine: Negative for increased urination.  Genitourinary: Negative for vaginal dryness.  Musculoskeletal: Positive for arthralgias, joint pain, myalgias, morning stiffness and myalgias. Negative for joint swelling, muscle weakness and muscle tenderness.  Skin: Negative for color change, rash, hair loss, skin tightness, ulcers and sensitivity to sunlight.  Allergic/Immunologic: Negative for susceptible to infections.    Neurological: Negative for dizziness, memory loss and night sweats.  Hematological: Negative for swollen glands.  Psychiatric/Behavioral: Negative for depressed mood and sleep disturbance. The patient is not nervous/anxious.     PMFS History:  Patient Active Problem List   Diagnosis Date Noted  . Sjogren's syndrome ([place]) 09/22/2016  . Fibromyalgia 09/22/2016  . Chronic pain syndrome 09/22/2016  . Osteoarthritis of both hands 09/22/2016  . Migraine 09/22/2016  . Schizophrenia, paranoid type ([place]) 04/25/2012  . DEPRESSION 10/21/2010  . OVERWEIGHT/OBESITY 12/18/2009  . Hypertension 12/18/2009  . 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 Cardiomyopathy   . Depression   . Fibromyalgia   . Hypertension     Family History  Problem Relation Age of Onset  . Hypertension Mother    Past Surgical History:  Procedure Laterality Date  . ABDOMINAL HYSTERECTOMY     Social History   Social History Narrative  . No narrative on file     Objective: Vital Signs: BP 115/71 (BP Location: Right Arm, Patient Position: Sitting, Cuff Size: Large)   Pulse 75   Resp 13   Ht 5\' 5"  (1.651 m)   Wt 218 lb (98.9 kg)   BMI 36.28 kg/m    Physical Exam  Constitutional: She is oriented to person, place, and time. She appears well-developed and well-nourished.  HENT:  Head: Normocephalic and atraumatic.  Eyes: Conjunctivae and EOM are normal.  Neck: Normal range of motion.  Cardiovascular: Normal rate, regular rhythm, normal heart sounds and intact distal pulses.   Pulmonary/Chest: Effort normal and breath sounds normal.  Abdominal: Soft. Bowel sounds are normal.  Lymphadenopathy:    She has no cervical adenopathy.  Neurological: She is alert and oriented to person, place, and time.  Skin: Skin is warm and dry. Capillary refill takes less than 2 seconds.  Psychiatric: She has a normal mood and affect. Her behavior is normal.  Nursing note and  vitals reviewed.    Musculoskeletal Exam: C-spine, thoracic, lumbar spine good range of motion. Shoulder joints, elbow joints, MCP joints, wrist joints good range of motion. She has thickening of PIP/DIP joints in her hands consistent with osteoarthritis she has a Dupuytren's contracture in her left fourth digit. Hip joints, knee joints, ankle joints good range of motion. With no synovitis she has tenderness on palpation over bilateral trochanteric bursa area consist with trochanteric bursitis.  CDAI Exam: No CDAI exam completed.    Investigation: No additional findings.   Imaging: No results found.  Speciality Comments: No specialty comments available.    Procedures:  No procedures performed Allergies: Sulfonamide derivatives   Assessment / Plan:     Visit Diagnoses: Sjogren's syndrome - Positive ANA, positive Ro, sicca symptoms. The symptoms are tolerable she is using over-the-counter products.  Fibromyalgia: She continues to have generalized pain fatigue and myalgias. She's using gabapentin which does help.  Chronic pain syndrome  Primary osteoarthritis of both hands: Joint protection and muscle strengthening was discussed.  Essential hypertension  Trochanteric bursitis of both hips,she has bilateral trochanteric bursitis. We discussed option of cortisone injection which she declined I'll give her ITB and exercise handout.   Orders: No orders of the defined types were placed in this encounter.  No orders of the defined types were placed in this encounter.   Face-to-face time spent with patient was 30 minutes. 50% of time was spent in counseling and coordination of care.  Follow-Up Instructions: Return in about 6 months (around 03/24/2017).   Bo Merino, MD

## 2016-09-23 ENCOUNTER — Ambulatory Visit (INDEPENDENT_AMBULATORY_CARE_PROVIDER_SITE_OTHER): Payer: BLUE CROSS/BLUE SHIELD | Admitting: Rheumatology

## 2016-09-23 ENCOUNTER — Encounter: Payer: Self-pay | Admitting: Rheumatology

## 2016-09-23 VITALS — BP 115/71 | HR 75 | Resp 13 | Ht 65.0 in | Wt 218.0 lb

## 2016-09-23 DIAGNOSIS — M7061 Trochanteric bursitis, right hip: Secondary | ICD-10-CM | POA: Diagnosis not present

## 2016-09-23 DIAGNOSIS — G894 Chronic pain syndrome: Secondary | ICD-10-CM | POA: Diagnosis not present

## 2016-09-23 DIAGNOSIS — G43809 Other migraine, not intractable, without status migrainosus: Secondary | ICD-10-CM

## 2016-09-23 DIAGNOSIS — M19041 Primary osteoarthritis, right hand: Secondary | ICD-10-CM

## 2016-09-23 DIAGNOSIS — M19042 Primary osteoarthritis, left hand: Secondary | ICD-10-CM | POA: Diagnosis not present

## 2016-09-23 DIAGNOSIS — M3500 Sicca syndrome, unspecified: Secondary | ICD-10-CM

## 2016-09-23 DIAGNOSIS — I429 Cardiomyopathy, unspecified: Secondary | ICD-10-CM | POA: Diagnosis not present

## 2016-09-23 DIAGNOSIS — M797 Fibromyalgia: Secondary | ICD-10-CM

## 2016-09-23 DIAGNOSIS — I10 Essential (primary) hypertension: Secondary | ICD-10-CM | POA: Diagnosis not present

## 2016-09-23 DIAGNOSIS — M7062 Trochanteric bursitis, left hip: Secondary | ICD-10-CM | POA: Diagnosis not present

## 2016-09-23 NOTE — Patient Instructions (Signed)
Iliotibial Band Syndrome Rehab  Ask your health care provider which exercises are safe for you. Do exercises exactly as told by your health care provider and adjust them as directed. It is normal to feel mild stretching, pulling, tightness, or discomfort as you do these exercises, but you should stop right away if you feel sudden pain or your pain gets worse. Do not begin these exercises until told by your health care provider.  Stretching and range of motion exercises  These exercises warm up your muscles and joints and improve the movement and flexibility of your hip and pelvis.  Exercise A: Quadriceps, prone     1. Lie on your abdomen on a firm surface, such as a bed or padded floor.  2. Bend your left / right knee and hold your ankle. If you cannot reach your ankle or pant leg, loop a belt around your foot and grab the belt instead.  3. Gently pull your heel toward your buttocks. Your knee should not slide out to the side. You should feel a stretch in the front of your thigh and knee.  4. Hold this position for __________ seconds.  Repeat __________ times. Complete this stretch __________ times a day.  Exercise B: Iliotibial band     1. Lie on your side with your left / right leg in the top position.  2. Bend both of your knees and grab your left / right ankle. Stretch out your bottom arm to help you balance.  3. Slowly bring your top knee back so your thigh goes behind your trunk.  4. Slowly lower your top leg toward the floor until you feel a gentle stretch on the outside of your left / right hip and thigh. If you do not feel a stretch and your knee will not fall farther, place the heel of your other foot on top of your knee and pull your knee down toward the floor with your foot.  5. Hold this position for __________ seconds.  Repeat __________ times. Complete this stretch __________ times a day.  Strengthening exercises  These exercises build strength and endurance in your hip and pelvis. Endurance is the  ability to use your muscles for a long time, even after they get tired.  Exercise C: Straight leg raises (   hip abductors)  1. Lie on your side with your left / right leg in the top position. Lie so your head, shoulder, knee, and hip line up. You may bend your bottom knee to help you balance.  2. Roll your hips slightly forward so your hips are stacked directly over each other and your left / right knee is facing forward.  3. Tense the muscles in your outer thigh and lift your top leg 4-6 inches (10-15 cm).  4. Hold this position for __________ seconds.  5. Slowly return to the starting position. Let your muscles relax completely before doing another repetition.  Repeat __________ times. Complete this exercise __________ times a day.  Exercise D: Straight leg raises (  hip extensors)  1. Lie on your abdomen on your bed or a firm surface. You can put a pillow under your hips if that is more comfortable.  2. Bend your left / right knee so your foot is straight up in the air.  3. Squeeze your buttock muscles and lift your left / right thigh off the bed. Do not let your back arch.  4. Tense this muscle as hard as you can without increasing any   on a bottom step. Stand on your left / right leg with your other foot unsupported next to the step. You can hold onto the railing or wall if needed for balance. 2. Keep your knees straight and your torso square. Then, lift your left / right hip up toward the ceiling. 3. Slowly let your left / right hip lower toward the floor, past the starting position. Your foot should get closer to the floor. Do not lean or bend your knees. Repeat __________ times. Complete this exercise __________ times a day. This information is not intended to  replace advice given to you by your health care provider. Make sure you discuss any questions you have with your health care provider. Document Released: 10/10/2005 Document Revised: 06/14/2016 Document Reviewed: 09/11/2015 Elsevier Interactive Patient Education  2017 [employer]. Knee Exercises Ask your health care provider which exercises are safe for you. Do exercises exactly as told by your health care provider and adjust them as directed. It is normal to feel mild stretching, pulling, tightness, or discomfort as you do these exercises, but you should stop right away if you feel sudden pain or your pain gets worse.Do not begin these exercises until told by your health care provider. STRETCHING AND RANGE OF MOTION EXERCISES  These exercises warm up your muscles and joints and improve the movement and flexibility of your knee. These exercises also help to relieve pain, numbness, and tingling. Exercise A: Knee Extension, Prone 5. Lie on your abdomen on a bed. 6. Place your left / right knee just beyond the edge of the surface so your knee is not on the bed. You can put a towel under your left / right thigh just above your knee for comfort. 7. Relax your leg muscles and allow gravity to straighten your knee. You should feel a stretch behind your left / right knee. 8. Hold this position for __________ seconds. 9. Scoot up so your knee is supported between repetitions. Repeat __________ times. Complete this stretch __________ times a day. Exercise B: Knee Flexion, Active  6. Lie on your back with both knees straight. If this causes back discomfort, bend your left / right knee so your foot is flat on the floor. 7. Slowly slide your left / right heel back toward your buttocks until you feel a gentle stretch in the front of your knee or thigh. 8. Hold this position for __________ seconds. 9. Slowly slide your left / right heel back to the starting position. Repeat __________ times. Complete this  exercise __________ times a day. Exercise C: Quadriceps, Prone  6. Lie on your abdomen on a firm surface, such as a bed or padded floor. 7. Bend your left / right knee and hold your ankle. If you cannot reach your ankle or pant leg, loop a belt around your foot and grab the belt instead. 8. Gently pull your heel toward your buttocks. Your knee should not slide out to the side. You should feel a stretch in the front of your thigh and knee. 9. Hold this position for __________ seconds. Repeat __________ times. Complete this stretch __________ times a day. Exercise D: Hamstring, Supine 1. Lie on your back. 2. Loop a belt or towel over the ball of your left / right foot. The ball of your foot is on the walking surface, right under your toes. 3. Straighten your left / right knee and slowly pull on the belt to raise your leg until you feel a gentle stretch behind your  knee.  Do not let your left / right knee bend while you do this.  Keep your other leg flat on the floor. 4. Hold this position for __________ seconds. Repeat __________ times. Complete this stretch __________ times a day. STRENGTHENING EXERCISES  These exercises build strength and endurance in your knee. Endurance is the ability to use your muscles for a long time, even after they get tired. Exercise E: Quadriceps, Isometric  4. Lie on your back with your left / right leg extended and your other knee bent. Put a rolled towel or small pillow under your knee if told by your health care provider. 5. Slowly tense the muscles in the front of your left / right thigh. You should see your kneecap slide up toward your hip or see increased dimpling just above the knee. This motion will push the back of the knee toward the floor. 6. For __________ seconds, keep the muscle as tight as you can without increasing your pain. 7. Relax the muscles slowly and completely. Repeat __________ times. Complete this exercise __________ times a day. Exercise  F: Straight Leg Raises - Quadriceps 1. Lie on your back with your left / right leg extended and your other knee bent. 2. Tense the muscles in the front of your left / right thigh. You should see your kneecap slide up or see increased dimpling just above the knee. Your thigh may even shake a bit. 3. Keep these muscles tight as you raise your leg 4-6 inches (10-15 cm) off the floor. Do not let your knee bend. 4. Hold this position for __________ seconds. 5. Keep these muscles tense as you lower your leg. 6. Relax your muscles slowly and completely after each repetition. Repeat __________ times. Complete this exercise __________ times a day. Exercise G: Hamstring, Isometric 1. Lie on your back on a firm surface. 2. Bend your left / right knee approximately __________ degrees. 3. Dig your left / right heel into the surface as if you are trying to pull it toward your buttocks. Tighten the muscles in the back of your thighs to dig as hard as you can without increasing any pain. 4. Hold this position for __________ seconds. 5. Release the tension gradually and allow your muscles to relax completely for __________ seconds after each repetition. Repeat __________ times. Complete this exercise __________ times a day. Exercise H: Hamstring Curls  If told by your health care provider, do this exercise while wearing ankle weights. Begin with __________ weights. Then increase the weight by 1 lb (0.5 kg) increments. Do not wear ankle weights that are more than __________. 1. Lie on your abdomen with your legs straight. 2. Bend your left / right knee as far as you can without feeling pain. Keep your hips flat against the floor. 3. Hold this position for __________ seconds. 4. Slowly lower your leg to the starting position. Repeat __________ times. Complete this exercise __________ times a day. Exercise I: Squats (Quadriceps) 1. Stand in front of a table, with your feet and knees pointing straight ahead. You  may rest your hands on the table for balance but not for support. 2. Slowly bend your knees and lower your hips like you are going to sit in a chair.  Keep your weight over your heels, not over your toes.  Keep your lower legs upright so they are parallel with the table legs.  Do not let your hips go lower than your knees.  Do not bend lower than told  by your health care provider.  If your knee pain increases, do not bend as low. 3. Hold the squat position for __________ seconds. 4. Slowly push with your legs to return to standing. Do not use your hands to pull yourself to standing. Repeat __________ times. Complete this exercise __________ times a day. Exercise J: Wall Slides (Quadriceps)  1. Lean your back against a smooth wall or door while you walk your feet out 18-24 inches (46-61 cm) from it. 2. Place your feet hip-width apart. 3. Slowly slide down the wall or door until your knees bend __________ degrees. Keep your knees over your heels, not over your toes. Keep your knees in line with your hips. 4. Hold for __________ seconds. Repeat __________ times. Complete this exercise __________ times a day. Exercise K: Straight Leg Raises - Hip Abductors 1. Lie on your side with your left / right leg in the top position. Lie so your head, shoulder, knee, and hip line up. You may bend your bottom knee to help you keep your balance. 2. Roll your hips slightly forward so your hips are stacked directly over each other and your left / right knee is facing forward. 3. Leading with your heel, lift your top leg 4-6 inches (10-15 cm). You should feel the muscles in your outer hip lifting.  Do not let your foot drift forward.  Do not let your knee roll toward the ceiling. 4. Hold this position for __________ seconds. 5. Slowly return your leg to the starting position. 6. Let your muscles relax completely after each repetition. Repeat __________ times. Complete this exercise __________ times a  day. Exercise L: Straight Leg Raises - Hip Extensors 1. Lie on your abdomen on a firm surface. You can put a pillow under your hips if that is more comfortable. 2. Tense the muscles in your buttocks and lift your left / right leg about 4-6 inches (10-15 cm). Keep your knee straight as you lift your leg. 3. Hold this position for __________ seconds. 4. Slowly lower your leg to the starting position. 5. Let your leg relax completely after each repetition. Repeat __________ times. Complete this exercise __________ times a day. This information is not intended to replace advice given to you by your health care provider. Make sure you discuss any questions you have with your health care provider. Document Released: 08/24/2005 Document Revised: 07/04/2016 Document Reviewed: 08/16/2015 Elsevier Interactive Patient Education  2017 [employer].

## 2016-10-10 ENCOUNTER — Other Ambulatory Visit: Payer: Self-pay | Admitting: *Deleted

## 2016-10-10 MED ORDER — TOPIRAMATE 100 MG PO TABS: ORAL_TABLET | ORAL | 5 refills | Status: DC

## 2016-10-10 NOTE — Telephone Encounter (Signed)
Last Visit: 09/23/16 Next Visit: 03/24/17  Okay to refill Topiramate?

## 2016-10-10 NOTE — Telephone Encounter (Signed)
ok 

## 2017-02-09 DIAGNOSIS — J029 Acute pharyngitis, unspecified: Secondary | ICD-10-CM | POA: Diagnosis not present

## 2017-02-09 DIAGNOSIS — J069 Acute upper respiratory infection, unspecified: Secondary | ICD-10-CM | POA: Diagnosis not present

## 2017-02-16 ENCOUNTER — Telehealth: Payer: Self-pay | Admitting: Radiology

## 2017-02-16 ENCOUNTER — Other Ambulatory Visit: Payer: Self-pay | Admitting: *Deleted

## 2017-02-16 MED ORDER — CARVEDILOL 6.25 MG PO TABS: 6.2500 mg | ORAL_TABLET | Freq: Two times a day (BID) | ORAL | 1 refills | Status: DC

## 2017-02-16 NOTE — Telephone Encounter (Signed)
Too soon

## 2017-02-16 NOTE — Telephone Encounter (Signed)
Refill request received via fax for Topamax [facility]

## 2017-02-17 ENCOUNTER — Other Ambulatory Visit: Payer: Self-pay

## 2017-02-17 MED ORDER — CARVEDILOL 6.25 MG PO TABS: 6.2500 mg | ORAL_TABLET | Freq: Two times a day (BID) | ORAL | 1 refills | Status: DC

## 2017-02-21 DIAGNOSIS — N281 Cyst of kidney, acquired: Secondary | ICD-10-CM | POA: Diagnosis not present

## 2017-02-21 DIAGNOSIS — I10 Essential (primary) hypertension: Secondary | ICD-10-CM | POA: Diagnosis not present

## 2017-02-21 DIAGNOSIS — I427 Cardiomyopathy due to drug and external agent: Secondary | ICD-10-CM | POA: Diagnosis not present

## 2017-02-22 ENCOUNTER — Other Ambulatory Visit: Payer: Self-pay | Admitting: Rheumatology

## 2017-02-22 NOTE — Telephone Encounter (Signed)
Last Visit: 09/23/16 Next Visit: 03/27/17  Okay to refill Topiramate?

## 2017-02-22 NOTE — Telephone Encounter (Signed)
ok 

## 2017-02-23 DIAGNOSIS — F333 Major depressive disorder, recurrent, severe with psychotic symptoms: Secondary | ICD-10-CM | POA: Diagnosis not present

## 2017-03-23 DIAGNOSIS — I10 Essential (primary) hypertension: Secondary | ICD-10-CM | POA: Diagnosis not present

## 2017-03-23 DIAGNOSIS — R1030 Lower abdominal pain, unspecified: Secondary | ICD-10-CM | POA: Diagnosis not present

## 2017-03-24 ENCOUNTER — Ambulatory Visit: Payer: BLUE CROSS/BLUE SHIELD | Admitting: Rheumatology

## 2017-03-27 ENCOUNTER — Ambulatory Visit: Payer: BLUE CROSS/BLUE SHIELD | Admitting: Rheumatology

## 2017-04-16 ENCOUNTER — Other Ambulatory Visit: Payer: Self-pay | Admitting: Cardiology

## 2017-05-16 ENCOUNTER — Ambulatory Visit (INDEPENDENT_AMBULATORY_CARE_PROVIDER_SITE_OTHER): Payer: BLUE CROSS/BLUE SHIELD | Admitting: Urology

## 2017-05-16 DIAGNOSIS — N281 Cyst of kidney, acquired: Secondary | ICD-10-CM

## 2017-05-23 DIAGNOSIS — I427 Cardiomyopathy due to drug and external agent: Secondary | ICD-10-CM | POA: Diagnosis not present

## 2017-05-23 DIAGNOSIS — E663 Overweight: Secondary | ICD-10-CM | POA: Diagnosis not present

## 2017-05-23 DIAGNOSIS — I10 Essential (primary) hypertension: Secondary | ICD-10-CM | POA: Diagnosis not present

## 2017-05-23 DIAGNOSIS — Z6836 Body mass index (BMI) 36.0-36.9, adult: Secondary | ICD-10-CM | POA: Diagnosis not present

## 2017-05-23 DIAGNOSIS — N281 Cyst of kidney, acquired: Secondary | ICD-10-CM | POA: Diagnosis not present

## 2017-05-23 DIAGNOSIS — M797 Fibromyalgia: Secondary | ICD-10-CM | POA: Diagnosis not present

## 2017-05-23 DIAGNOSIS — Z0001 Encounter for general adult medical examination with abnormal findings: Secondary | ICD-10-CM | POA: Diagnosis not present

## 2017-05-26 DIAGNOSIS — D3000 Benign neoplasm of unspecified kidney: Secondary | ICD-10-CM | POA: Diagnosis not present

## 2017-05-26 DIAGNOSIS — N281 Cyst of kidney, acquired: Secondary | ICD-10-CM | POA: Diagnosis not present

## 2017-05-26 DIAGNOSIS — K8020 Calculus of gallbladder without cholecystitis without obstruction: Secondary | ICD-10-CM | POA: Diagnosis not present

## 2017-06-05 ENCOUNTER — Other Ambulatory Visit: Payer: Self-pay | Admitting: Urology

## 2017-06-05 DIAGNOSIS — D3000 Benign neoplasm of unspecified kidney: Secondary | ICD-10-CM

## 2017-06-14 ENCOUNTER — Ambulatory Visit (INDEPENDENT_AMBULATORY_CARE_PROVIDER_SITE_OTHER): Payer: BLUE CROSS/BLUE SHIELD | Admitting: Urology

## 2017-06-14 DIAGNOSIS — N281 Cyst of kidney, acquired: Secondary | ICD-10-CM

## 2017-06-20 ENCOUNTER — Other Ambulatory Visit: Payer: Self-pay | Admitting: Urology

## 2017-06-20 DIAGNOSIS — D3000 Benign neoplasm of unspecified kidney: Secondary | ICD-10-CM

## 2017-08-10 DIAGNOSIS — F333 Major depressive disorder, recurrent, severe with psychotic symptoms: Secondary | ICD-10-CM | POA: Diagnosis not present

## 2017-09-28 ENCOUNTER — Other Ambulatory Visit: Payer: Self-pay | Admitting: *Deleted

## 2017-09-28 MED ORDER — CARVEDILOL 6.25 MG PO TABS: 6.2500 mg | ORAL_TABLET | Freq: Two times a day (BID) | ORAL | 0 refills | Status: DC

## 2018-02-01 DIAGNOSIS — I509 Heart failure, unspecified: Secondary | ICD-10-CM | POA: Diagnosis not present

## 2018-02-01 DIAGNOSIS — M1990 Unspecified osteoarthritis, unspecified site: Secondary | ICD-10-CM | POA: Diagnosis not present

## 2018-02-01 DIAGNOSIS — Z85828 Personal history of other malignant neoplasm of skin: Secondary | ICD-10-CM | POA: Diagnosis not present

## 2018-02-01 DIAGNOSIS — I110 Hypertensive heart disease with heart failure: Secondary | ICD-10-CM | POA: Diagnosis not present

## 2018-02-01 DIAGNOSIS — M797 Fibromyalgia: Secondary | ICD-10-CM | POA: Diagnosis not present

## 2018-02-01 DIAGNOSIS — Z79899 Other long term (current) drug therapy: Secondary | ICD-10-CM | POA: Diagnosis not present

## 2018-02-01 DIAGNOSIS — M25562 Pain in left knee: Secondary | ICD-10-CM | POA: Diagnosis not present

## 2018-02-05 DIAGNOSIS — I10 Essential (primary) hypertension: Secondary | ICD-10-CM | POA: Diagnosis not present

## 2018-02-05 DIAGNOSIS — E663 Overweight: Secondary | ICD-10-CM | POA: Diagnosis not present

## 2018-02-05 DIAGNOSIS — F17210 Nicotine dependence, cigarettes, uncomplicated: Secondary | ICD-10-CM | POA: Diagnosis not present

## 2018-02-05 DIAGNOSIS — M797 Fibromyalgia: Secondary | ICD-10-CM | POA: Diagnosis not present

## 2018-02-05 DIAGNOSIS — I429 Cardiomyopathy, unspecified: Secondary | ICD-10-CM | POA: Diagnosis not present

## 2018-02-05 DIAGNOSIS — I427 Cardiomyopathy due to drug and external agent: Secondary | ICD-10-CM | POA: Diagnosis not present

## 2018-02-05 DIAGNOSIS — Z0001 Encounter for general adult medical examination with abnormal findings: Secondary | ICD-10-CM | POA: Diagnosis not present

## 2018-02-05 DIAGNOSIS — F200 Paranoid schizophrenia: Secondary | ICD-10-CM | POA: Diagnosis not present

## 2018-02-06 ENCOUNTER — Encounter (INDEPENDENT_AMBULATORY_CARE_PROVIDER_SITE_OTHER): Payer: Self-pay | Admitting: Orthopaedic Surgery

## 2018-02-06 ENCOUNTER — Ambulatory Visit (INDEPENDENT_AMBULATORY_CARE_PROVIDER_SITE_OTHER): Payer: BLUE CROSS/BLUE SHIELD | Admitting: Orthopaedic Surgery

## 2018-02-06 VITALS — BP 138/86 | HR 67 | Ht 65.0 in | Wt 215.0 lb

## 2018-02-06 DIAGNOSIS — M1712 Unilateral primary osteoarthritis, left knee: Secondary | ICD-10-CM | POA: Diagnosis not present

## 2018-02-06 NOTE — Progress Notes (Signed)
Office Visit Note   Patient: Brittany Sweeney           Date of Birth: September 19, 1961           MRN: [id number] Visit Date: 02/06/2018              Requested by: Rosita Fire, MD [address] [place], [place] [address] PCP: Rosita Fire, MD   Assessment & Plan: Visit Diagnoses:  1. Unilateral primary osteoarthritis, left knee     Plan: Intra-articular injection performed left knee which she tolerated well.  Post injection she could walk better in the office without limping and had relief of pain but still occasionally felt a little bit of catching but no true locking.  Work slip given for out of work from Monday through Thursday she can resume work on Friday.  If she has persistent symptoms she will call let us know we can consider MRI scan of her left knee to rule out medial meniscal tear or chondral flap tear with catching.  I plan to recheck her in 3 weeks.  Follow-Up Instructions: Return in about 3 weeks (around 02/27/2018).   Orders:  Orders Placed This Encounter  Procedures  . Large Joint Inj: L knee   No orders of the defined types were placed in this encounter.     Procedures: Large Joint Inj: L knee on 02/06/2018 4:15 PM Indications: joint swelling and pain Details: 22 G 1.5 in needle, anterolateral approach  Arthrogram: No  Medications: 0.5 mL lidocaine 1 %; 3 mL bupivacaine 0.5 %; 40 mg methylPREDNISolone acetate 40 MG/ML Outcome: tolerated well, no immediate complications Procedure, treatment alternatives, risks and benefits explained, specific risks discussed. Consent was given by the patient. Immediately prior to procedure a time out was called to verify the correct patient, procedure, equipment, support staff and site/side marked as required. Patient was prepped and draped in the usual sterile fashion.       Clinical Data: No additional findings.   Subjective: Chief Complaint  Patient presents with  . Left Knee - Pain    HPI 57 year old  female with worsening left knee pain for greater than a year.  She has pain medially.  X-rays 01/1118 done at [facility] rocking him in [place] showed no acute changes and mild degenerative changes with medial joint narrowing and slight spurring tricompartmental.  Patient received a steroid shot in her buttocks she is taken ibuprofen and Tylenol with slight improvement she is had difficulty walking and working and is on her feet at work.  She is had some catching pain medially but no true locking.  No history of gout.  She denies fever or chills.  No bowel or bladder symptoms.  No other joint symptoms.  Review of Systems 14 point review of systems positive for increased BMI, hypertension, secondary cardiomyopathy, depression, history of schizophrenia, Sjogren's syndrome, fibromyalgia, migraines.  Osteoarthritis of the hands.  Otherwise negative as pertains HPI.   Objective: Vital Signs: BP 138/86   Pulse 67   Ht 5\' 5"  (1.651 m)   Wt 215 lb (97.5 kg)   BMI 35.78 kg/m   Physical Exam  Constitutional: She is oriented to person, place, and time. She appears well-developed.  HENT:  Head: Normocephalic.  Right Ear: External ear normal.  Left Ear: External ear normal.  Eyes: Pupils are equal, round, and reactive to light.  Neck: No tracheal deviation present. No thyromegaly present.  Cardiovascular: Normal rate.  Pulmonary/Chest: Effort normal.  Abdominal: Soft.  Neurological:  She is alert and oriented to person, place, and time.  Skin: Skin is warm and dry.  Psychiatric: She has a normal mood and affect. Her behavior is normal.    Ortho Exam patient has mild limitation of internal rotation both hips at 20 degrees without pain.  Knee and ankle jerk are intact negative straight leg raising 90 degrees.  Left knee shows mild effusion.  She has medial joint line tenderness with flexion-extension she has a catching sensation medially but no true locking.  Some medial joint line pain with hyperextension.  Pain  with patellofemoral loading and quadriceps contracture.  ACL PCL exam is normal To soft distal pulses are intact sensation is intact no rash or exposed skin.  Mild trochanteric tenderness minimal sciatic notch tenderness.  Specialty Comments:  No specialty comments available.  Imaging: No results found.   PMFS History: Patient Active Problem List   Diagnosis Date Noted  . Sjogren's syndrome ([place]) 09/22/2016  . Fibromyalgia 09/22/2016  . Chronic pain syndrome 09/22/2016  . Osteoarthritis of both hands 09/22/2016  . Migraine 09/22/2016  . Schizophrenia, paranoid type ([place]) 04/25/2012  . DEPRESSION 10/21/2010  . OVERWEIGHT/OBESITY 12/18/2009  . Hypertension 12/18/2009  . Secondary cardiomyopathy ([place]) 12/18/2009   Past Medical History:  Diagnosis Date  . Cardiomyopathy   . Depression   . Fibromyalgia   . Hypertension     Family History  Problem Relation Age of Onset  . Hypertension Mother     Past Surgical History:  Procedure Laterality Date  . ABDOMINAL HYSTERECTOMY     Social History   Occupational History  . Not on file  Tobacco Use  . Smoking status: Never Smoker  . Smokeless tobacco: Never Used  Substance and Sexual Activity  . Alcohol use: No    Alcohol/week: 0.0 oz  . Drug use: No  . Sexual activity: Yes    Birth control/protection: Surgical

## 2018-02-07 ENCOUNTER — Encounter (INDEPENDENT_AMBULATORY_CARE_PROVIDER_SITE_OTHER): Payer: Self-pay | Admitting: Orthopaedic Surgery

## 2018-02-07 MED ORDER — BUPIVACAINE HCL 0.5 % IJ SOLN
3.0000 mL | INTRAMUSCULAR | Status: AC | PRN
  Administered 2018-02-06: 3 mL via INTRA_ARTICULAR

## 2018-02-07 MED ORDER — LIDOCAINE HCL 1 % IJ SOLN
0.5000 mL | INTRAMUSCULAR | Status: AC | PRN
  Administered 2018-02-06: .5 mL

## 2018-02-07 MED ORDER — METHYLPREDNISOLONE ACETATE 40 MG/ML IJ SUSP
40.0000 mg | INTRAMUSCULAR | Status: AC | PRN
  Administered 2018-02-06: 40 mg via INTRA_ARTICULAR

## 2018-02-08 ENCOUNTER — Ambulatory Visit (INDEPENDENT_AMBULATORY_CARE_PROVIDER_SITE_OTHER): Payer: BLUE CROSS/BLUE SHIELD | Admitting: Orthopaedic Surgery

## 2018-02-22 ENCOUNTER — Ambulatory Visit (INDEPENDENT_AMBULATORY_CARE_PROVIDER_SITE_OTHER): Payer: BLUE CROSS/BLUE SHIELD | Admitting: Orthopaedic Surgery

## 2018-02-22 ENCOUNTER — Encounter (INDEPENDENT_AMBULATORY_CARE_PROVIDER_SITE_OTHER): Payer: Self-pay | Admitting: Orthopaedic Surgery

## 2018-02-22 VITALS — BP 133/79 | HR 78 | Ht 65.0 in | Wt 215.0 lb

## 2018-02-22 DIAGNOSIS — G8929 Other chronic pain: Secondary | ICD-10-CM | POA: Diagnosis not present

## 2018-02-22 DIAGNOSIS — M25562 Pain in left knee: Secondary | ICD-10-CM | POA: Diagnosis not present

## 2018-02-26 ENCOUNTER — Encounter (INDEPENDENT_AMBULATORY_CARE_PROVIDER_SITE_OTHER): Payer: Self-pay | Admitting: Orthopaedic Surgery

## 2018-02-26 NOTE — Progress Notes (Signed)
Office Visit Note   Patient: Brittany Sweeney           Date of Birth: 01-17-1961           MRN: [id number] Visit Date: 02/22/2018              Requested by: Rosita Fire, MD [address] [place], [place] [address] PCP: Rosita Fire, MD   Assessment & Plan: Visit Diagnoses:  1. Chronic pain of left knee     Plan: Patient is having repetitive mechanical catching symptoms and has been treated with anti-inflammatories rest, intra-articular cortisone injection 02/06/2018.  X-rays done at [facility] in [place] showed mild degenerative changes with mild medial joint line narrowing.  We will proceed with an MRI scan for mechanical symptoms.  May have a meniscal tear with repetitive locking or possibly cartilage flap tear that is causing repetitive catching.  Office follow-up after MRI scan for review.  Follow-Up Instructions: No follow-ups on file.   Orders:  Orders Placed This Encounter  Procedures  . MR Knee Left w/o contrast   No orders of the defined types were placed in this encounter.     Procedures: No procedures performed   Clinical Data: No additional findings.   Subjective: Chief Complaint  Patient presents with  . Left Knee - Pain    HPI 57 year old female returns post knee injection 02/06/2018 and states the injection only helped for about a week.  Her left knee is catching again with locking.  She states she is having a hard time at work she is been wearing a knee sleeve taking Tylenol and ibuprofen and does not think it really helps.  She is concerned that she may fall and said to catch herself a few times in her left knee locks.  Review of Systems 14 point review of systems updated from 02/06/2018.  Of note is history of schizophrenia, secondary cardiomyopathy, hypertension, increased BMI, fibromyalgia and migraines.  Otherwise negative as it pertains HPI.   Objective: Vital Signs: BP 133/79   Pulse 78   Ht 5\' 5"  (1.651 m)   Wt 215 lb  (97.5 kg)   BMI 35.78 kg/m   Physical Exam  Constitutional: She is oriented to person, place, and time. She appears well-developed.  HENT:  Head: Normocephalic.  Right Ear: External ear normal.  Left Ear: External ear normal.  Eyes: Pupils are equal, round, and reactive to light.  Neck: No tracheal deviation present. No thyromegaly present.  Cardiovascular: Normal rate.  Pulmonary/Chest: Effort normal.  Abdominal: Soft.  Neurological: She is alert and oriented to person, place, and time.  Skin: Skin is warm and dry.  Psychiatric: She has a normal mood and affect. Her behavior is normal.    Ortho Exam negative logroll to the hips.  Right knee shows no effusion no tenderness.  Venous exam right knee is normal.  Left knee shows medial joint line tenderness.  Collateral ligaments are stable.  Flexion against resistance she has a catching sensation and points to the medial joint line just posterior to the MCL.  She has pain with patellofemoral loading and quadriceps contracture.  No laxity of patellar tracking.  Distal pulses are 2+.  No sciatic notch tenderness.  Medial joint line pain with hyperextension.  ACL PCL exam is normal.  Specialty Comments:  No specialty comments available.  Imaging: No results found.   PMFS History: Patient Active Problem List   Diagnosis Date Noted  . Sjogren's syndrome ([place]) 09/22/2016  .  Fibromyalgia 09/22/2016  . Chronic pain syndrome 09/22/2016  . Osteoarthritis of both hands 09/22/2016  . Migraine 09/22/2016  . Schizophrenia, paranoid type ([place]) 04/25/2012  . DEPRESSION 10/21/2010  . OVERWEIGHT/OBESITY 12/18/2009  . Hypertension 12/18/2009  . Secondary cardiomyopathy ([place]) 12/18/2009   Past Medical History:  Diagnosis Date  . Cardiomyopathy   . Depression   . Fibromyalgia   . Hypertension     Family History  Problem Relation Age of Onset  . Hypertension Mother     Past Surgical History:  Procedure Laterality Date  . ABDOMINAL  HYSTERECTOMY     Social History   Occupational History  . Not on file  Tobacco Use  . Smoking status: Never Smoker  . Smokeless tobacco: Never Used  Substance and Sexual Activity  . Alcohol use: No    Alcohol/week: 0.0 oz  . Drug use: No  . Sexual activity: Yes    Birth control/protection: Surgical

## 2018-02-28 DIAGNOSIS — F333 Major depressive disorder, recurrent, severe with psychotic symptoms: Secondary | ICD-10-CM | POA: Diagnosis not present

## 2018-03-01 ENCOUNTER — Encounter: Payer: Self-pay | Admitting: Orthopaedic Surgery

## 2018-03-01 DIAGNOSIS — M25562 Pain in left knee: Secondary | ICD-10-CM | POA: Diagnosis not present

## 2018-03-01 DIAGNOSIS — M23352 Other meniscus derangements, posterior horn of lateral meniscus, left knee: Secondary | ICD-10-CM | POA: Diagnosis not present

## 2018-03-01 DIAGNOSIS — S83242A Other tear of medial meniscus, current injury, left knee, initial encounter: Secondary | ICD-10-CM | POA: Diagnosis not present

## 2018-03-01 DIAGNOSIS — M23342 Other meniscus derangements, anterior horn of lateral meniscus, left knee: Secondary | ICD-10-CM | POA: Diagnosis not present

## 2018-03-01 DIAGNOSIS — S83282A Other tear of lateral meniscus, current injury, left knee, initial encounter: Secondary | ICD-10-CM | POA: Diagnosis not present

## 2018-03-01 DIAGNOSIS — M1712 Unilateral primary osteoarthritis, left knee: Secondary | ICD-10-CM | POA: Diagnosis not present

## 2018-03-05 DIAGNOSIS — N390 Urinary tract infection, site not specified: Secondary | ICD-10-CM | POA: Diagnosis not present

## 2018-03-05 DIAGNOSIS — R319 Hematuria, unspecified: Secondary | ICD-10-CM | POA: Diagnosis not present

## 2018-03-05 DIAGNOSIS — R300 Dysuria: Secondary | ICD-10-CM | POA: Diagnosis not present

## 2018-03-06 ENCOUNTER — Telehealth (INDEPENDENT_AMBULATORY_CARE_PROVIDER_SITE_OTHER): Payer: Self-pay | Admitting: Orthopaedic Surgery

## 2018-03-06 NOTE — Progress Notes (Signed)
Office Visit Note  Brittany Sweeney: Brittany Sweeney             Date of Birth: 04/04/1961           MRN: [id number]             PCP: Rosita Fire, MD Referring: Rosita Fire, MD Visit Date: 03/12/2018 Occupation: @GUAROCC @    Subjective:  Trochanteric bursitis bilaterally    History of Present Illness: Brittany Sweeney is a 57 y.o. female with history of Sjogren's, osteoarthritis, and fibromyalgia.  Brittany Sweeney continues to have sicca symptoms.  Brittany Sweeney symptoms are most severe in Brittany Sweeney eyes and Brittany Sweeney uses eye drops several times per day.  Brittany Sweeney sees Brittany Sweeney dentist on a regular basis.  Brittany Sweeney denies any recent dental cavities.  Brittany Sweeney states that Brittany Sweeney has had work with Brittany Sweeney PCP and will have the lab results faxed to [place]. Brittany Sweeney continues to have fibromyalgia flares on a regular basis.  Brittany Sweeney states Brittany Sweeney last flare was 3 weeks ago.  Brittany Sweeney continues to have generalized muscle aches muscle tenderness worse in Brittany Sweeney lower extremities.  Brittany Sweeney also has bilateral trochanteric bursitis.  Brittany Sweeney states that Brittany Sweeney does not provide exercise on a regular basis though.  Brittany Sweeney continues to take Topamax and gabapentin on a daily basis.  Brittany Sweeney has chronic fatigue and insomnia.  Brittany Sweeney denies any pain reasons this time.  Brittany Sweeney denies any joint swelling at this time.  Brittany Sweeney states Brittany Sweeney occasionally will have some stiffness in Brittany Sweeney hands.  Brittany Sweeney reports that Brittany Sweeney is having left knee arthroscopic surgery by Dr. Lorin Mercy on June 5.   Activities of Daily Living:  Brittany Sweeney reports morning stiffness for 20 minutes.   Brittany Sweeney Reports nocturnal pain.  Difficulty dressing/grooming: Denies Difficulty climbing stairs: Reports Difficulty getting out of chair: Denies Difficulty using hands for taps, buttons, cutlery, and/or writing: Denies   Review of Systems  Constitutional: Positive for fatigue.  HENT: Positive for mouth dryness. Negative for mouth sores and nose dryness.   Eyes: Positive for dryness. Negative for pain and visual disturbance.  Respiratory: Negative for cough,  hemoptysis, shortness of breath and difficulty breathing.   Cardiovascular: Negative for chest pain, palpitations, hypertension and swelling in legs/feet.  Gastrointestinal: Negative for abdominal pain, blood in stool, constipation and diarrhea.  Endocrine: Negative for increased urination.  Genitourinary: Negative for painful urination and pelvic pain.  Musculoskeletal: Positive for arthralgias, joint pain and morning stiffness. Negative for joint swelling, myalgias, muscle weakness, muscle tenderness and myalgias.  Skin: Negative for color change, pallor, rash, hair loss, nodules/bumps, skin tightness, ulcers and sensitivity to sunlight.  Allergic/Immunologic: Negative for susceptible to infections.  Neurological: Negative for dizziness, light-headedness, numbness, headaches, memory loss and weakness.  Hematological: Negative for swollen glands.  Psychiatric/Behavioral: Negative for depressed mood, confusion and sleep disturbance. The Brittany Sweeney is not nervous/anxious.     PMFS History:  Brittany Sweeney Active Problem List   Diagnosis Date Noted  . Sjogren's syndrome ([place]) 09/22/2016  . Fibromyalgia 09/22/2016  . Chronic pain syndrome 09/22/2016  . Osteoarthritis of both hands 09/22/2016  . Migraine 09/22/2016  . Schizophrenia, paranoid type ([place]) 04/25/2012  . DEPRESSION 10/21/2010  . OVERWEIGHT/OBESITY 12/18/2009  . Hypertension 12/18/2009  . 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 Cardiomyopathy   . Depression   . Fibromyalgia   . Hypertension     Family History  Problem Relation Age of Onset  . Hypertension Mother    Past Surgical History:  Procedure Laterality Date  . ABDOMINAL HYSTERECTOMY     Social History   Social History Narrative  . Not on file     Objective: Vital Signs: BP 120/77 (BP Location: Left Arm, Brittany Sweeney Position: Sitting, Cuff Size: Normal)   Pulse 72   Resp 16   Ht 5\' 5"  (1.651 m)   Wt 224 lb (101.6 kg)    BMI 37.28 kg/m    Physical Exam  Constitutional: Brittany Sweeney is oriented to person, place, and time. Brittany Sweeney appears well-developed and well-nourished.  HENT:  Head: Normocephalic and atraumatic.  Eyes: Conjunctivae and EOM are normal.  Neck: Normal range of motion.  Cardiovascular: Normal rate, regular rhythm, normal heart sounds and intact distal pulses.  Pulmonary/Chest: Effort normal and breath sounds normal.  Abdominal: Soft. Bowel sounds are normal.  Lymphadenopathy:    Brittany Sweeney has no cervical adenopathy.  Neurological: Brittany Sweeney is alert and oriented to person, place, and time.  Skin: Skin is warm and dry. Capillary refill takes less than 2 seconds.  Psychiatric: Brittany Sweeney has a normal mood and affect. Brittany Sweeney behavior is normal.  Nursing note and vitals reviewed.    Musculoskeletal Exam: C-spine, thoracic spine, lumbar spine good range of motion.  No midline spinal tenderness.  No SI joint tenderness.  Shoulder joints, elbow joints, wrist joints, MCPs, PIPs, DIPs good range of motion with no synovitis.  Brittany Sweeney has mild PIP synovial thickening consistent with osteoarthritic changes.  Hip joints, knee joints, ankle joints, MTPs, PIPs, DIPs good range of motion with no synovitis.  PIP and DIP synovial thickening consistent with also arthritis of bilateral feet.  No warmth or effusion of knee joints.  Brittany Sweeney has tenderness of bilateral trochanteric bursa.  CDAI Exam: No CDAI exam completed.    Investigation: No additional findings.   Imaging: No results found.  Speciality Comments: No specialty comments available.    Procedures:  No procedures performed Allergies: Sulfonamide derivatives   Assessment / Plan:     Visit Diagnoses: Sjogren's syndrome, with unspecified organ involvement ([place]) - Positive ANA, positive Ro: Brittany Sweeney continues to have sicca symptoms.  Brittany Sweeney eye dryness is more severe than Brittany Sweeney mouth dryness.  Brittany Sweeney use eyedrops several times per day.  We discussed trying Biotene products over-the-counter.   Brittany Sweeney continues to see Brittany Sweeney dentist on a regular basis and denies any recent dental caries.  Brittany Sweeney had no parotid swelling on exam today.  Fibromyalgia: Brittany Sweeney continues to have generalized muscle aches and muscle tenderness especially in Brittany Sweeney lower extremities due to fibromyalgia.  Brittany Sweeney last fibromyalgia flare was 3 weeks ago.  Brittany Sweeney continues to have chronic fatigue which is related to Brittany Sweeney insomnia.  Brittany Sweeney was encouraged to start exercising on a regular basis.  Brittany Sweeney takes Topamax 100 mg daily and Gabapentin 100 mg at bedtime.  A 30-day supply of Topamax was sent to the pharmacy.  Brittany Sweeney was advised to have Brittany Sweeney PCP send [place] Brittany Sweeney recent lab results.    Chronic pain syndrome: Brittany Sweeney takes Gabapentin 100 mg at bedtime and Topamax 100 mg daily, which help with Brittany Sweeney pain.    Primary osteoarthritis of both hands: Brittany Sweeney has PIP synovial thickening consistent with osteoarthritis.  Joint protection and muscle strengthening were discussed.   Trochanteric bursitis of both hips: Brittany Sweeney has tenderness of bilateral trochanteric bursa.  Brittany Sweeney was given a hangout of exercises that Brittany Sweeney can perform at home.   History of hypertension: Brittany Sweeney BP was well controlled in the office today.     Orders: No orders of the  defined types were placed in this encounter.  Meds ordered this encounter  Medications  . topiramate (TOPAMAX) 100 MG tablet    Sig: TAKE 1 AND 1/2 TABLET BY MOUTH ONCE DAILY    Dispense:  45 tablet    Refill:  0    Follow-Up Instructions: Return in about 6 months (around 09/12/2018) for Sjogren's syndrome, Fibromyalgia, Osteoarthritis.   Ofilia Neas, PA-C   I examined and evaluated the Brittany Sweeney with Hazel Sams PA.  Brittany Sweeney had no synovitis on examination.  Brittany Sweeney had tenderness over trochanteric bursa per my exam.  Brittany Sweeney has been getting off benefit from Topamax.  We will give Brittany Sweeney 30-day supply.  The plan of care was discussed as noted above.  Bo Merino, MD  Note - This record has been created using Editor, commissioning.    Chart creation errors have been sought, but may not always  have been located. Such creation errors do not reflect on  the standard of medical care.

## 2018-03-06 NOTE — Telephone Encounter (Signed)
Patient calling to see if we've gotten the results from her MRI yet? [place] location advised her to call here to check. Please advise patient if so # [phone]

## 2018-03-06 NOTE — Telephone Encounter (Signed)
MRI on [place], I printed report and it is at your clinic workstation. See note from patient, pls advise? Thanks.

## 2018-03-06 NOTE — Telephone Encounter (Signed)
Icalled. Needs ROV Thursday to review thanks

## 2018-03-07 NOTE — Telephone Encounter (Signed)
Can you please get this patient an appointment in [place] tomorrow?

## 2018-03-08 ENCOUNTER — Encounter (INDEPENDENT_AMBULATORY_CARE_PROVIDER_SITE_OTHER): Payer: Self-pay | Admitting: Orthopaedic Surgery

## 2018-03-08 ENCOUNTER — Ambulatory Visit (INDEPENDENT_AMBULATORY_CARE_PROVIDER_SITE_OTHER): Payer: BLUE CROSS/BLUE SHIELD | Admitting: Orthopaedic Surgery

## 2018-03-08 VITALS — BP 128/80 | HR 71 | Ht 65.0 in | Wt 220.0 lb

## 2018-03-08 DIAGNOSIS — M23322 Other meniscus derangements, posterior horn of medial meniscus, left knee: Secondary | ICD-10-CM

## 2018-03-08 NOTE — Progress Notes (Deleted)
   Post-Op Visit Note   Patient: Brittany Sweeney           Date of Birth: [birth year]           MRN: [id number] Visit Date: 03/08/2018 PCP: Rosita Fire, MD   Assessment & Plan: Post carpal tunnel release on the right and C5-6 ACDF.  Good relief of preop symptoms.  Return 4 weeks for flexion-extension lateral C-spine x-rays.  Chief Complaint:  Chief Complaint  Patient presents with  . Left Knee - Pain    MRI review   Visit Diagnoses:  1. History of fusion of cervical spine     Plan: Return 4 weeks for lateral flexion-extension C-spine x-rays out of collar.  Follow-Up Instructions: Return in about 1 month (around 04/05/2018).   Orders:  No orders of the defined types were placed in this encounter.  No orders of the defined types were placed in this encounter.   Imaging: No results found.  PMFS History: Patient Active Problem List   Diagnosis Date Noted  . Sjogren's syndrome ([place]) 09/22/2016  . Fibromyalgia 09/22/2016  . Chronic pain syndrome 09/22/2016  . Osteoarthritis of both hands 09/22/2016  . Migraine 09/22/2016  . Schizophrenia, paranoid type ([place]) 04/25/2012  . DEPRESSION 10/21/2010  . OVERWEIGHT/OBESITY 12/18/2009  . Hypertension 12/18/2009  . 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 Cardiomyopathy   . Depression   . Fibromyalgia   . Hypertension     Family History  Problem Relation Age of Onset  . Hypertension Mother     Past Surgical History:  Procedure Laterality Date  . ABDOMINAL HYSTERECTOMY     Social History   Occupational History  . Not on file  Tobacco Use  . Smoking status: Never Smoker  . Smokeless tobacco: Never Used  Substance and Sexual Activity  . Alcohol use: No    Alcohol/week: 0.0 oz  . Drug use: No  . Sexual activity: Yes    Birth control/protection: Surgical

## 2018-03-08 NOTE — Progress Notes (Signed)
Office Visit Note   Patient: Brittany Sweeney           Date of Birth: 16-Sep-1961           MRN: [id number] Visit Date: 03/08/2018              Requested by: Rosita Fire, MD [address] [place], [place] [address] PCP: Rosita Fire, MD   Assessment & Plan: Visit Diagnoses:  1. Other meniscus derangements, posterior horn of medial meniscus, left knee         Left knee tear medial and lateral meniscus  Plan: MRI scan is reviewed with patient.  This shows large radial tear of the medial meniscal body extending to the posterior horn with small radial tear.  Large horizontal tear of the lateral meniscus extending from the body to the posterior horn.  She has mild medial compartment arthritis.  Discussed options she like to proceed with  Left knee arthroscopy for mechanical catching with medial lateral meniscal tears.  Arthroscopic procedure discussed.  This will be done as an outpatient.  Expected time out of work would be in the 2 to 3-week range.    Follow-Up Instructions: Return in about 1 month (around 04/05/2018).   Orders:  No orders of the defined types were placed in this encounter.  No orders of the defined types were placed in this encounter.     Procedures: No procedures performed   Clinical Data: No additional findings.   Subjective: Chief Complaint  Patient presents with  . Left Knee - Pain    MRI review    HPI 57 year old female returns with ongoing problems with her left knee with repetitive catching more medial than lateral joint line pain.  He states at times her knee has locked but this is not occurred in the office.  MRI scan has been obtained and is available for review today.  He has been treated with anti-inflammatories injection on 02/06/2018 without relief.  She cuts fabric for cars.  Pain bothers her at night.  Review of Systems 14 point review of systems updated.  3 of hypertension increased BMI, fibromyalgia, migraines.  Positive for  schizophrenia on treatment.  She is continuing to work.  2010 showed ejection fraction 55 to 60% without abnormalities and normal wall motion.  Mitral regurg noted surface area 7cc.[name]  Dyspnea no shortness of breath no chest pain.  Prior to her knee problems she is able to walk up several flights of steps without problems.   Objective: Vital Signs: BP 128/80   Pulse 71   Ht 5\' 5"  (1.651 m)   Wt 220 lb (99.8 kg)   BMI 36.61 kg/m   Physical Exam  Constitutional: She is oriented to person, place, and time. She appears well-developed.  HENT:  Head: Normocephalic.  Right Ear: External ear normal.  Left Ear: External ear normal.  Eyes: Pupils are equal, round, and reactive to light.  Neck: No tracheal deviation present. No thyromegaly present.  Cardiovascular: Normal rate.  Pulmonary/Chest: Effort normal.  Abdominal: Soft.  Neurological: She is alert and oriented to person, place, and time.  Skin: Skin is warm and dry.  Psychiatric: She has a normal mood and affect. Her behavior is normal.    Ortho Exam patient has negative straight leg raising.  No pain with logroll the hips.  Less.  Left knee medial joint line tenderness moderate to severe.  Pain with hyperextension.  Moderate lateral joint line tenderness.  ACL PCL exam is  normal.  Normal patellar tracking.  Distal pulses are 2+. Specialty Comments:  No specialty comments available.  Imaging: No results found.   PMFS History: Patient Active Problem List   Diagnosis Date Noted  . Sjogren's syndrome ([place]) 09/22/2016  . Fibromyalgia 09/22/2016  . Chronic pain syndrome 09/22/2016  . Osteoarthritis of both hands 09/22/2016  . Migraine 09/22/2016  . Schizophrenia, paranoid type ([place]) 04/25/2012  . DEPRESSION 10/21/2010  . OVERWEIGHT/OBESITY 12/18/2009  . Hypertension 12/18/2009  . 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 Cardiomyopathy   . Depression   . Fibromyalgia   .  Hypertension     Family History  Problem Relation Age of Onset  . Hypertension Mother     Past Surgical History:  Procedure Laterality Date  . ABDOMINAL HYSTERECTOMY     Social History   Occupational History  . Not on file  Tobacco Use  . Smoking status: Never Smoker  . Smokeless tobacco: Never Used  Substance and Sexual Activity  . Alcohol use: No    Alcohol/week: 0.0 oz  . Drug use: No  . Sexual activity: Yes    Birth control/protection: Surgical

## 2018-03-12 ENCOUNTER — Ambulatory Visit (INDEPENDENT_AMBULATORY_CARE_PROVIDER_SITE_OTHER): Payer: BLUE CROSS/BLUE SHIELD | Admitting: Rheumatology

## 2018-03-12 ENCOUNTER — Encounter: Payer: Self-pay | Admitting: Rheumatology

## 2018-03-12 VITALS — BP 120/77 | HR 72 | Resp 16 | Ht 65.0 in | Wt 224.0 lb

## 2018-03-12 DIAGNOSIS — M797 Fibromyalgia: Secondary | ICD-10-CM | POA: Diagnosis not present

## 2018-03-12 DIAGNOSIS — M3500 Sicca syndrome, unspecified: Secondary | ICD-10-CM

## 2018-03-12 DIAGNOSIS — M19041 Primary osteoarthritis, right hand: Secondary | ICD-10-CM | POA: Diagnosis not present

## 2018-03-12 DIAGNOSIS — M19042 Primary osteoarthritis, left hand: Secondary | ICD-10-CM

## 2018-03-12 DIAGNOSIS — M7061 Trochanteric bursitis, right hip: Secondary | ICD-10-CM | POA: Diagnosis not present

## 2018-03-12 DIAGNOSIS — Z8679 Personal history of other diseases of the circulatory system: Secondary | ICD-10-CM | POA: Diagnosis not present

## 2018-03-12 DIAGNOSIS — M7062 Trochanteric bursitis, left hip: Secondary | ICD-10-CM | POA: Diagnosis not present

## 2018-03-12 DIAGNOSIS — G894 Chronic pain syndrome: Secondary | ICD-10-CM | POA: Diagnosis not present

## 2018-03-12 MED ORDER — TOPIRAMATE 100 MG PO TABS: ORAL_TABLET | ORAL | 0 refills | Status: DC

## 2018-03-12 NOTE — Patient Instructions (Signed)

## 2018-03-20 ENCOUNTER — Other Ambulatory Visit: Payer: Self-pay | Admitting: Cardiology

## 2018-03-26 ENCOUNTER — Telehealth (INDEPENDENT_AMBULATORY_CARE_PROVIDER_SITE_OTHER): Payer: Self-pay | Admitting: Orthopaedic Surgery

## 2018-03-26 ENCOUNTER — Encounter: Payer: Self-pay | Admitting: Orthopaedic Surgery

## 2018-03-26 DIAGNOSIS — M94262 Chondromalacia, left knee: Secondary | ICD-10-CM | POA: Diagnosis not present

## 2018-03-26 DIAGNOSIS — X58XXXA Exposure to other specified factors, initial encounter: Secondary | ICD-10-CM | POA: Diagnosis not present

## 2018-03-26 DIAGNOSIS — S83242A Other tear of medial meniscus, current injury, left knee, initial encounter: Secondary | ICD-10-CM | POA: Diagnosis not present

## 2018-03-26 DIAGNOSIS — G8918 Other acute postprocedural pain: Secondary | ICD-10-CM | POA: Diagnosis not present

## 2018-03-26 DIAGNOSIS — S83282A Other tear of lateral meniscus, current injury, left knee, initial encounter: Secondary | ICD-10-CM | POA: Diagnosis not present

## 2018-03-26 DIAGNOSIS — S83232A Complex tear of medial meniscus, current injury, left knee, initial encounter: Secondary | ICD-10-CM | POA: Diagnosis not present

## 2018-03-26 DIAGNOSIS — Y999 Unspecified external cause status: Secondary | ICD-10-CM | POA: Diagnosis not present

## 2018-03-26 NOTE — Telephone Encounter (Signed)
Patient called asked how many days does she need to leave the ace bandage on. The number to contact patient is [phone]

## 2018-03-27 NOTE — Telephone Encounter (Signed)
I called patient. She has already taken a shower and wanted to know if she needed to take the steri strips off. I advised to leave these on until follow up appt.

## 2018-03-27 NOTE — Telephone Encounter (Signed)
Patient called back requesting a call back about her walker and ace bandage. [phone]

## 2018-04-04 ENCOUNTER — Other Ambulatory Visit: Payer: Self-pay | Admitting: Physician Assistant

## 2018-04-04 NOTE — Telephone Encounter (Signed)
Last visit: 03/12/18 Next visit: 09/13/18  Okay to refill per Dr. Deveshwar   

## 2018-04-05 ENCOUNTER — Encounter (INDEPENDENT_AMBULATORY_CARE_PROVIDER_SITE_OTHER): Payer: Self-pay | Admitting: Orthopaedic Surgery

## 2018-04-05 ENCOUNTER — Ambulatory Visit (INDEPENDENT_AMBULATORY_CARE_PROVIDER_SITE_OTHER): Payer: BLUE CROSS/BLUE SHIELD | Admitting: Orthopaedic Surgery

## 2018-04-05 VITALS — BP 116/76 | HR 87 | Ht 65.0 in | Wt 215.0 lb

## 2018-04-05 DIAGNOSIS — Z9889 Other specified postprocedural states: Secondary | ICD-10-CM

## 2018-04-05 NOTE — Progress Notes (Signed)
   Post-Op Visit Note   Patient: Brittany Sweeney           Date of Birth: June 13, 1961           MRN: [id number] Visit Date: 04/05/2018 PCP: Rosita Fire, MD   Assessment & Plan:  Chief Complaint:  Chief Complaint  Patient presents with  . Left Knee - Pain   Visit Diagnoses:  1. Status post meniscectomy     Plan: Patient post left knee partial meniscectomy for medial meniscal tear.  Swelling is down she is walking well has minimal swelling and has noticed improvement in her symptoms.  She also had some tearing of her lateral meniscus which was also trimmed.  We discussed the operative findings she can use some ice intermittently use some ibuprofen as needed.  She is happy with the surgical result and I will check her back on an as-needed basis.  Work slip given for no work until 04/16/2018.  She can return to regular work 04/16/2018 no restrictions  Follow-Up Instructions: No follow-ups on file.   Orders:  No orders of the defined types were placed in this encounter.  No orders of the defined types were placed in this encounter.   Imaging: No results found.  PMFS History: Patient Active Problem List   Diagnosis Date Noted  . Sjogren's syndrome ([place]) 09/22/2016  . Fibromyalgia 09/22/2016  . Chronic pain syndrome 09/22/2016  . Osteoarthritis of both hands 09/22/2016  . Migraine 09/22/2016  . Schizophrenia, paranoid type ([place]) 04/25/2012  . DEPRESSION 10/21/2010  . OVERWEIGHT/OBESITY 12/18/2009  . Hypertension 12/18/2009  . 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 Cardiomyopathy   . Depression   . Fibromyalgia   . Hypertension     Family History  Problem Relation Age of Onset  . Hypertension Mother     Past Surgical History:  Procedure Laterality Date  . ABDOMINAL HYSTERECTOMY     Social History   Occupational History  . Not on file  Tobacco Use  . Smoking status: Never Smoker  . Smokeless tobacco: Never Used    Substance and Sexual Activity  . Alcohol use: No    Alcohol/week: 0.0 oz  . Drug use: Never  . Sexual activity: Yes    Birth control/protection: Surgical

## 2018-04-10 ENCOUNTER — Telehealth (INDEPENDENT_AMBULATORY_CARE_PROVIDER_SITE_OTHER): Payer: Self-pay | Admitting: Orthopaedic Surgery

## 2018-04-10 NOTE — Telephone Encounter (Signed)
Spoke with patient mailed [facility] form to patient to complete and return to [place]

## 2018-04-27 ENCOUNTER — Other Ambulatory Visit: Payer: Self-pay | Admitting: Physician Assistant

## 2018-04-27 NOTE — Telephone Encounter (Signed)
Last visit: 03/12/18 Next visit: 09/13/18  Okay to refill per Dr. Estanislado Pandy

## 2018-05-25 ENCOUNTER — Other Ambulatory Visit: Payer: Self-pay | Admitting: Rheumatology

## 2018-06-16 ENCOUNTER — Other Ambulatory Visit: Payer: Self-pay | Admitting: Cardiology

## 2018-07-27 DIAGNOSIS — I429 Cardiomyopathy, unspecified: Secondary | ICD-10-CM | POA: Diagnosis not present

## 2018-07-27 DIAGNOSIS — I10 Essential (primary) hypertension: Secondary | ICD-10-CM | POA: Diagnosis not present

## 2018-07-27 DIAGNOSIS — M797 Fibromyalgia: Secondary | ICD-10-CM | POA: Diagnosis not present

## 2018-08-02 ENCOUNTER — Encounter: Payer: Self-pay | Admitting: Emergency Medicine

## 2018-08-13 DIAGNOSIS — L821 Other seborrheic keratosis: Secondary | ICD-10-CM | POA: Diagnosis not present

## 2018-08-13 DIAGNOSIS — T50995A Adverse effect of other drugs, medicaments and biological substances, initial encounter: Secondary | ICD-10-CM | POA: Diagnosis not present

## 2018-08-14 ENCOUNTER — Other Ambulatory Visit: Payer: Self-pay | Admitting: Rheumatology

## 2018-08-14 NOTE — Telephone Encounter (Signed)
Last visit: 03/12/2018 Next visit: 09/13/2018   Okay to refill per Dr. Estanislado Pandy.

## 2018-08-15 ENCOUNTER — Ambulatory Visit: Payer: Self-pay | Admitting: Psychiatry

## 2018-08-31 NOTE — Progress Notes (Signed)
Office Visit Note  Patient: Brittany Sweeney             Date of Birth: Feb 16, 1961           MRN: [id number]             PCP: Rosita Fire, MD Referring: Rosita Fire, MD Visit Date: 09/13/2018 Occupation: @GUAROCC @  Subjective:  Fatigue, and dry mouth.  History of Present Illness: Brittany Sweeney is a 57 y.o. female with history of Sjogren's syndrome, fibromyalgia, and osteoarthritis.  Patient is on Topamax 100 mg 1 and 1/2 tablets daily and gabapentin 100 mg at bedtime.  She continues to have some discomfort from fibromyalgia.  She complains of dry mouth and dry eyes.  Hand pain is tolerable.  Trochanteric bursitis comes and goes.  Activities of Daily Living:  Patient reports morning stiffness for 5 minutes.   Patient Reports nocturnal pain.  Difficulty dressing/grooming: Denies Difficulty climbing stairs: Reports Difficulty getting out of chair: Reports Difficulty using hands for taps, buttons, cutlery, and/or writing: Denies  Review of Systems  Constitutional: Positive for fatigue. Negative for night sweats, weight gain and weight loss.  HENT: Negative for mouth sores, trouble swallowing, trouble swallowing, mouth dryness and nose dryness.   Eyes: Positive for dryness. Negative for pain, redness and visual disturbance.  Respiratory: Negative for cough, hemoptysis, shortness of breath and difficulty breathing.   Cardiovascular: Negative for chest pain, palpitations, hypertension, irregular heartbeat and swelling in legs/feet.  Gastrointestinal: Negative for blood in stool, constipation and diarrhea.  Endocrine: Negative for increased urination.  Genitourinary: Negative for painful urination and vaginal dryness.  Musculoskeletal: Positive for arthralgias, joint pain, myalgias, morning stiffness and myalgias. Negative for joint swelling, muscle weakness and muscle tenderness.  Skin: Negative for color change, pallor, rash, hair loss, nodules/bumps, skin tightness, ulcers and  sensitivity to sunlight.  Allergic/Immunologic: Negative for susceptible to infections.  Neurological: Negative for dizziness, numbness, headaches, memory loss, night sweats and weakness.  Hematological: Negative for swollen glands.  Psychiatric/Behavioral: Negative for depressed mood and sleep disturbance. The patient is not nervous/anxious.     PMFS History:  Patient Active Problem List   Diagnosis Date Noted  . Sjogren's syndrome ([place]) 09/22/2016  . Fibromyalgia 09/22/2016  . Chronic pain syndrome 09/22/2016  . Osteoarthritis of both hands 09/22/2016  . Migraine 09/22/2016  . Schizophrenia, paranoid type ([place]) 04/25/2012  . DEPRESSION 10/21/2010  . OVERWEIGHT/OBESITY 12/18/2009  . Hypertension 12/18/2009  . Secondary cardiomyopathy ([place]) 12/18/2009    Past Medical History:  Diagnosis Date  . Cardiomyopathy   . Depression   . Fibromyalgia   . Hypertension     Family History  Problem Relation Age of Onset  . Hypertension Mother    Past Surgical History:  Procedure Laterality Date  . ABDOMINAL HYSTERECTOMY     Social History   Social History Narrative  . Not on file    Objective: Vital Signs: BP 126/78 (BP Location: Left Arm, Patient Position: Sitting, Cuff Size: Normal)   Pulse 75   Resp 15   Ht 5\' 5"  (1.651 m)   Wt 231 lb (104.8 kg)   BMI 38.44 kg/m    Physical Exam  Constitutional: She is oriented to person, place, and time. She appears well-developed and well-nourished.  HENT:  Head: Normocephalic and atraumatic.  Eyes: Conjunctivae and EOM are normal.  Neck: Normal range of motion.  Cardiovascular: Normal rate, regular rhythm, normal heart sounds and intact distal pulses.  Pulmonary/Chest: Effort normal and breath  sounds normal.  Abdominal: Soft. Bowel sounds are normal.  Lymphadenopathy:    She has no cervical adenopathy.  Neurological: She is alert and oriented to person, place, and time.  Skin: Skin is warm and dry. Capillary refill takes less  than 2 seconds.  Psychiatric: She has a normal mood and affect. Her behavior is normal.  Nursing note and vitals reviewed.    Musculoskeletal Exam: C-spine thoracic lumbar spine good range of motion.  Shoulder joints elbow joints wrist joints with good range of motion.  She has some DIP and PIP thickening in her hands consistent with osteoarthritis.  Hip joints were in good range of motion.  She has some tenderness over bilateral trochanteric bursa.  Knee joints ankles MTPs PIPs were in good range of motion.  CDAI Exam: CDAI Score: Not documented Patient Global Assessment: Not documented; Provider Global Assessment: Not documented Swollen: Not documented; Tender: Not documented Joint Exam   Not documented   There is currently no information documented on the homunculus. Go to the Rheumatology activity and complete the homunculus joint exam.  Investigation: No additional findings.  Imaging: No results found.  Recent Labs: Lab Results  Component Value Date   WBC 5.4 04/23/2012   HGB 14.2 04/23/2012   PLT 229 04/23/2012   NA 140 04/30/2012   K 3.9 04/30/2012   CL 105 04/30/2012   CO2 27 04/30/2012   GLUCOSE 90 04/30/2012   BUN 15 04/30/2012   CREATININE 0.79 04/30/2012   BILITOT 0.1 (L) 04/27/2012   ALKPHOS 70 04/27/2012   AST 13 04/27/2012   ALT 14 04/27/2012   PROT 7.0 04/27/2012   ALBUMIN 3.6 04/27/2012   CALCIUM 9.4 04/30/2012   GFRAA >90 04/30/2012   February 05, 2018 LDL 80, BMP normal, hemoglobin A1c 5.4, hepatic function normal, CBC normal Speciality Comments: No specialty comments available.  Procedures:  No procedures performed Allergies: Sulfonamide derivatives   Assessment / Plan:     Visit Diagnoses: Sjogren's syndrome, with unspecified organ involvement ([place]) - Positive ANA, positive Ro: Patient continues to have sicca symptoms.  She also complains of some myalgias and arthralgias.  Good oral hygiene was discussed.  Over-the-counter products were  discussed.  I have also advised her to get CBC, CMP, UA, RF, SPEP with her PCP.  Fibromyalgia - Topamax 100 mg daily and Gabapentin 100 mg at bedtime which are controlling her symptoms quite well.  Chronic pain syndrome-she continues to have generalized pain.  Primary osteoarthritis of both hands-joint protection muscle strengthening are discussed.  She does a lot of strenuous work at her job.  Trochanteric bursitis of both hips-IT band exercises were discussed.  Essential hypertension-her blood pressure is controlled.  I have advised her to get bone density.  She wants to get through her PCPs office.  Secondary cardiomyopathy   Migraines   Orders: No orders of the defined types were placed in this encounter.  No orders of the defined types were placed in this encounter.     Follow-Up Instructions: Return in about 6 months (around 03/14/2019) for Sjogren's syndrome, Fibromyalgia, Osteoarthritis.   Bo Merino, MD  Note - This record has been created using Editor, commissioning.  Chart creation errors have been sought, but may not always  have been located. Such creation errors do not reflect on  the standard of medical care.

## 2018-09-07 ENCOUNTER — Ambulatory Visit: Payer: BLUE CROSS/BLUE SHIELD | Admitting: Rheumatology

## 2018-09-13 ENCOUNTER — Encounter: Payer: Self-pay | Admitting: Rheumatology

## 2018-09-13 ENCOUNTER — Encounter (INDEPENDENT_AMBULATORY_CARE_PROVIDER_SITE_OTHER): Payer: Self-pay

## 2018-09-13 ENCOUNTER — Ambulatory Visit: Payer: BLUE CROSS/BLUE SHIELD | Admitting: Rheumatology

## 2018-09-13 VITALS — BP 126/78 | HR 75 | Resp 15 | Ht 65.0 in | Wt 231.0 lb

## 2018-09-13 DIAGNOSIS — M797 Fibromyalgia: Secondary | ICD-10-CM | POA: Diagnosis not present

## 2018-09-13 DIAGNOSIS — I10 Essential (primary) hypertension: Secondary | ICD-10-CM

## 2018-09-13 DIAGNOSIS — M3500 Sicca syndrome, unspecified: Secondary | ICD-10-CM

## 2018-09-13 DIAGNOSIS — G894 Chronic pain syndrome: Secondary | ICD-10-CM | POA: Diagnosis not present

## 2018-09-13 DIAGNOSIS — M7062 Trochanteric bursitis, left hip: Secondary | ICD-10-CM

## 2018-09-13 DIAGNOSIS — M19041 Primary osteoarthritis, right hand: Secondary | ICD-10-CM | POA: Diagnosis not present

## 2018-09-13 DIAGNOSIS — M19042 Primary osteoarthritis, left hand: Secondary | ICD-10-CM

## 2018-09-13 DIAGNOSIS — I429 Cardiomyopathy, unspecified: Secondary | ICD-10-CM

## 2018-09-13 DIAGNOSIS — M7061 Trochanteric bursitis, right hip: Secondary | ICD-10-CM

## 2018-09-13 DIAGNOSIS — G43809 Other migraine, not intractable, without status migrainosus: Secondary | ICD-10-CM

## 2018-09-14 ENCOUNTER — Other Ambulatory Visit: Payer: Self-pay | Admitting: Cardiology

## 2018-09-19 ENCOUNTER — Ambulatory Visit (INDEPENDENT_AMBULATORY_CARE_PROVIDER_SITE_OTHER): Payer: BLUE CROSS/BLUE SHIELD | Admitting: Psychiatry

## 2018-09-19 ENCOUNTER — Encounter: Payer: Self-pay | Admitting: Psychiatry

## 2018-09-19 DIAGNOSIS — F411 Generalized anxiety disorder: Secondary | ICD-10-CM

## 2018-09-19 DIAGNOSIS — F333 Major depressive disorder, recurrent, severe with psychotic symptoms: Secondary | ICD-10-CM

## 2018-09-19 MED ORDER — BUPROPION HCL ER (XL) 300 MG PO TB24: 300.0000 mg | ORAL_TABLET | Freq: Every day | ORAL | 3 refills | Status: DC

## 2018-09-19 NOTE — Progress Notes (Signed)
Brittany Sweeney [id number] [birth year] 57 y.o.  Subjective:   [name] is a 57 y.o. (DOB 10/01/1961) female.  Chief Complaint:  Chief Complaint  Patient presents with  . Follow-up    depression and anxiety    HPI Brittany Sweeney presents to the office today for follow-up of depression and anxiety.  Doing pretty good overall.  Problems with skin crawling and thinks she needs her liver checked.  Not itching.  All over.  Not RLS.  Patient reports stable mood and denies depressed or irritable moods. A little restless.  Patient denies any recent difficulty with anxiety.  Patient denies difficulty usually with sleep initiation or maintenance. Denies appetite disturbance.  Patient reports that energy and motivation have been good.  Patient denies any difficulty with concentration.  Patient denies any suicidal ideation.  No fear.    Review of Systems:  Review of Systems  Skin:       Skin crawling  Neurological: Negative for tremors and weakness.  Psychiatric/Behavioral: Negative for agitation, behavioral problems, confusion, decreased concentration, dysphoric mood, hallucinations, self-injury, sleep disturbance and suicidal ideas. The patient is not nervous/anxious and is not hyperactive.     Medications: I have reviewed the patient's current medications.  Current Outpatient Medications  Medication Sig Dispense Refill  . buPROPion (WELLBUTRIN XL) 150 MG 24 hr tablet Take 150 mg by mouth daily. Taking two pills twice a day    . carvedilol (COREG) 6.25 MG tablet Take 1 tablet (6.25 mg total) by mouth 2 (two) times daily with a meal. 180 tablet 0  . gabapentin (NEURONTIN) 100 MG capsule Take 100 mg by mouth at bedtime.    [name] nystatin-triamcinolone ointment (MYCOLOG) Apply 1 application topically 2 (two) times daily. (Patient taking differently: Apply 1 application topically as needed. ) 30 g 11  . risperiDONE (RISPERDAL) 3 MG tablet Take 0.5 tablets by mouth at bedtime.     . topiramate (TOPAMAX) 100 MG tablet TAKE 1 AND 1/2 TABLET BY MOUTH ONCE DAILY (Patient taking differently: Take 100 mg by mouth daily. ) 45 tablet 0   No current facility-administered medications for this visit.     Medication Side Effects: None  Allergies:  Allergies  Allergen Reactions  . Sulfonamide Derivatives Hives    Past Medical History:  Diagnosis Date  . Cardiomyopathy   . Depression   . Fibromyalgia   . Hypertension     Family History  Problem Relation Age of Onset  . Hypertension Mother     Social History   Socioeconomic History  . Marital status: Married    Spouse name: Not on file  . Number of children: Not on file  . Years of education: Not on file  . Highest education level: Not on file  Occupational History  . Not on file  Social Needs  . Financial resource strain: Not on file  . Food insecurity:    Worry: Not on file    Inability: Not on file  . Transportation needs:    Medical: Not on file    Non-medical: Not on file  Tobacco Use  . Smoking status: Never Smoker  . Smokeless tobacco: Never Used  Substance and Sexual Activity  . Alcohol use: No    Alcohol/week: 0.0 standard drinks  . Drug use: Never  . Sexual activity: Yes    Birth control/protection: Surgical  Lifestyle  . Physical activity:    Days per week: Not on file    Minutes per  session: Not on file  . Stress: Not on file  Relationships  . Social connections:    Talks on phone: Not on file    Gets together: Not on file    Attends religious service: Not on file    Active member of club or organization: Not on file    Attends meetings of clubs or organizations: Not on file    Relationship status: Not on file  . Intimate partner violence:    Fear of current or ex partner: Not on file    Emotionally abused: Not on file    Physically abused: Not on file    Forced sexual activity: Not on file  Other Topics Concern  . Not on file  Social History Narrative  . Not on file     Past Medical History, Surgical history, Social history, and Family history were reviewed and updated as appropriate.   Please see review of systems for further details on the patient's review from today.   Objective:   Physical Exam:  There were no vitals taken for this visit.  Physical Exam  Constitutional: She is oriented to person, place, and time. She appears well-developed. No distress.  Musculoskeletal: She exhibits no deformity.  Neurological: She is alert and oriented to person, place, and time. She displays no tremor. Coordination and gait normal.  Psychiatric: Her speech is normal and behavior is normal. Judgment and thought content normal. Her mood appears not anxious. Her affect is blunt. Her affect is not angry, not labile and not inappropriate. Cognition and memory are normal. She does not exhibit a depressed mood. She expresses no homicidal and no suicidal ideation. She expresses no suicidal plans and no homicidal plans.  Insight intact. No auditory or visual hallucinations. No delusions.  Chronically blunted. History of psychosis, none noted.    Lab Review:     Component Value Date/Time   NA 140 04/30/2012 0613   K 3.9 04/30/2012 0613   CL 105 04/30/2012 0613   CO2 27 04/30/2012 0613   GLUCOSE 90 04/30/2012 0613   BUN 15 04/30/2012 0613   CREATININE 0.79 04/30/2012 0613   CALCIUM 9.4 04/30/2012 0613   PROT 7.0 04/27/2012 1930   ALBUMIN 3.6 04/27/2012 1930   AST 13 04/27/2012 1930   ALT 14 04/27/2012 1930   ALKPHOS 70 04/27/2012 1930   BILITOT 0.1 (L) 04/27/2012 1930   GFRNONAA >90 04/30/2012 0613   GFRAA >90 04/30/2012 0613       Component Value Date/Time   WBC 5.4 04/23/2012 2131   RBC 4.54 04/23/2012 2131   HGB 14.2 04/23/2012 2131   HCT 40.0 04/23/2012 2131   PLT 229 04/23/2012 2131   MCV 88.1 04/23/2012 2131   MCH 31.3 04/23/2012 2131   MCHC 35.5 04/23/2012 2131   RDW 12.4 04/23/2012 2131   LYMPHSABS 2.1 04/23/2012 2131   MONOABS 0.4  04/23/2012 2131   EOSABS 0.1 04/23/2012 2131   BASOSABS 0.0 04/23/2012 2131    No results found for: POCLITH, LITHIUM   No results found for: PHENYTOIN, PHENOBARB, VALPROATE, CBMZ   .res Assessment: Plan:    Severe recurrent major depression with psychotic features ([place])  Generalized anxiety disorder   SX in remission with the meds.  She's pleased and satisfied.  History of relapse with attempts to decrease the Risperidone.  Therefore need to continue the current dosage.  Discussed potential metabolic side effects associated with atypical antipsychotics, as well as potential risk for movement side effects. Advised pt to  contact office if movement side effects occur.  Labs per pcp.  In the past she said she does best with the part of generic of bupropion and best on 2 of the 150s.  She does not seem concerned about doing it that way now so we will switch back to the 300 mg tablet once a day and she will get which have her generic is available.  She is agreeable  This appointment was 15 minutes  FU 6 mos  Lynder Parents, MD, DFAPA   Please see After Visit Summary for patient specific instructions.  Future Appointments  Date Time Provider [place]  03/19/2019  3:15 PM Bo Merino, MD [facility] None    No orders of the defined types were placed in this encounter.     -------------------------------

## 2018-09-24 ENCOUNTER — Other Ambulatory Visit: Payer: Self-pay

## 2018-09-24 MED ORDER — RISPERIDONE 3 MG PO TABS: 4.5000 mg | ORAL_TABLET | Freq: Every day | ORAL | 5 refills | Status: DC

## 2018-10-18 ENCOUNTER — Other Ambulatory Visit (HOSPITAL_COMMUNITY): Payer: Self-pay | Admitting: Internal Medicine

## 2018-10-18 DIAGNOSIS — Z1231 Encounter for screening mammogram for malignant neoplasm of breast: Secondary | ICD-10-CM

## 2018-10-21 ENCOUNTER — Other Ambulatory Visit: Payer: Self-pay | Admitting: Psychiatry

## 2018-10-21 NOTE — Telephone Encounter (Signed)
Need to verify dose, discrepancy with epic chart and rx requested

## 2018-10-22 NOTE — Telephone Encounter (Signed)
There was a discrepancy with her order and what's in epic.  risperdal 3mg  1/2 tab? risperdal 3mg  1.5 tabs?  Please clarify.  Thanks

## 2018-10-23 NOTE — Telephone Encounter (Signed)
Note dosage change .  Brittany Parents, MD, DFAPA

## 2018-10-29 ENCOUNTER — Ambulatory Visit (HOSPITAL_COMMUNITY)
Admission: RE | Admit: 2018-10-29 | Discharge: 2018-10-29 | Disposition: A | Discharge status: Home / Self Care | Payer: BLUE CROSS/BLUE SHIELD | Source: Ambulatory Visit | Attending: Internal Medicine | Admitting: Internal Medicine

## 2018-10-29 DIAGNOSIS — Z1231 Encounter for screening mammogram for malignant neoplasm of breast: Secondary | ICD-10-CM | POA: Insufficient documentation

## 2018-11-07 ENCOUNTER — Other Ambulatory Visit: Payer: Self-pay | Admitting: Rheumatology

## 2018-11-07 NOTE — Telephone Encounter (Signed)
Last Visit: 09/13/18 Next Visit: 03/19/19  Okay to refill per Dr. Deveshwar 

## 2018-12-04 ENCOUNTER — Other Ambulatory Visit: Payer: Self-pay | Admitting: Cardiology

## 2019-01-29 ENCOUNTER — Other Ambulatory Visit: Payer: Self-pay | Admitting: Rheumatology

## 2019-01-29 NOTE — Telephone Encounter (Signed)
Last Visit: 09/13/18 Next Visit: 03/19/19  Okay to refill per Dr. Estanislado Pandy

## 2019-03-07 NOTE — Progress Notes (Deleted)
Office Visit Note  Patient: Brittany Sweeney             Date of Birth: 1961/10/11           MRN: [id number]             PCP: Rosita Fire, MD Referring: Rosita Fire, MD Visit Date: 03/19/2019 Occupation: @GUAROCC @  Subjective:  No chief complaint on file.   History of Present Illness: Brittany Sweeney is a 58 y.o. female ***   Activities of Daily Living:  Patient reports morning stiffness for *** {minute/hour:19697}.   Patient {ACTIONS;DENIES/REPORTS:21021675::"Denies"} nocturnal pain.  Difficulty dressing/grooming: {ACTIONS;DENIES/REPORTS:21021675::"Denies"} Difficulty climbing stairs: {ACTIONS;DENIES/REPORTS:21021675::"Denies"} Difficulty getting out of chair: {ACTIONS;DENIES/REPORTS:21021675::"Denies"} Difficulty using hands for taps, buttons, cutlery, and/or writing: {ACTIONS;DENIES/REPORTS:21021675::"Denies"}  No Rheumatology ROS completed.   PMFS History:  Patient Active Problem List   Diagnosis Date Noted  . Sjogren's syndrome ([place]) 09/22/2016  . Fibromyalgia 09/22/2016  . Chronic pain syndrome 09/22/2016  . Osteoarthritis of both hands 09/22/2016  . Migraine 09/22/2016  . Schizophrenia, paranoid type ([employer]) 04/25/2012  . DEPRESSION 10/21/2010  . OVERWEIGHT/OBESITY 12/18/2009  . Hypertension 12/18/2009  . Secondary cardiomyopathy ([place]) 12/18/2009    Past Medical History:  Diagnosis Date  . Cardiomyopathy   . Depression   . Fibromyalgia   . Hypertension     Family History  Problem Relation Age of Onset  . Hypertension Mother    Past Surgical History:  Procedure Laterality Date  . ABDOMINAL HYSTERECTOMY     Social History   Social History Narrative  . Not on file    There is no immunization history on file for this patient.   Objective: Vital Signs: There were no vitals taken for this visit.   Physical Exam   Musculoskeletal Exam: ***  CDAI Exam: CDAI Score: Not documented Patient Global Assessment: Not documented; Provider Global  Assessment: Not documented Swollen: Not documented; Tender: Not documented Joint Exam   Not documented   There is currently no information documented on the homunculus. Go to the Rheumatology activity and complete the homunculus joint exam.  Investigation: No additional findings.  Imaging: No results found.  Recent Labs: Lab Results  Component Value Date   WBC 5.4 04/23/2012   HGB 14.2 04/23/2012   PLT 229 04/23/2012   NA 140 04/30/2012   K 3.9 04/30/2012   CL 105 04/30/2012   CO2 27 04/30/2012   GLUCOSE 90 04/30/2012   BUN 15 04/30/2012   CREATININE 0.79 04/30/2012   BILITOT 0.1 (L) 04/27/2012   ALKPHOS 70 04/27/2012   AST 13 04/27/2012   ALT 14 04/27/2012   PROT 7.0 04/27/2012   ALBUMIN 3.6 04/27/2012   CALCIUM 9.4 04/30/2012   GFRAA >90 04/30/2012    Speciality Comments: No specialty comments available.  Procedures:  No procedures performed Allergies: Sulfonamide derivatives   Assessment / Plan:     Visit Diagnoses: No diagnosis found.   Orders: No orders of the defined types were placed in this encounter.  No orders of the defined types were placed in this encounter.   Face-to-face time spent with patient was *** minutes. Greater than 50% of time was spent in counseling and coordination of care.  Follow-Up Instructions: No follow-ups on file.   Ofilia Neas, PA-C  Note - This record has been created using Dragon software.  Chart creation errors have been sought, but may not always  have been located. Such creation errors do not reflect on  the standard of medical care.

## 2019-03-11 NOTE — Progress Notes (Signed)
Office Visit Note  Patient: Brittany Sweeney             Date of Birth: 30-Sep-1961           MRN: [id number]             PCP: Rosita Fire, MD Referring: Rosita Fire, MD Visit Date: 03/12/2019 Occupation: @GUAROCC @  Subjective:  Pain in both knee joints   History of Present Illness: Brittany Sweeney is a 58 y.o. female with history of Sjogren's, fibromyalgia, and osteoarthritis.  She takes Gabapentin 100 mg 1 capsule by mouth daily and Topamax 100 mg 1 1/2 tablets by mouth daily.  She reports this has been a good combination of medications. She reports pain in both knee joints.  She denies any joint swelling.  She has intermittent trochanteric bursitis bilaterally.  She has been well at night.  She sleeps about 8 hours per night.  Her level of fatigue has improved since she has been laid off.  She uses voltaren gel topically PRN pain relief. She continues to have eye dryness but no mouth dryness.    Activities of Daily Living:  Patient reports morning stiffness for 2 minutes.   Patient Reports nocturnal pain.  Difficulty dressing/grooming: Denies Difficulty climbing stairs: Reports Difficulty getting out of chair: Reports Difficulty using hands for taps, buttons, cutlery, and/or writing: Denies  Review of Systems  Constitutional: Negative for fatigue.  HENT: Negative for mouth sores, mouth dryness and nose dryness.   Eyes: Positive for dryness. Negative for pain and visual disturbance.  Respiratory: Negative for cough, hemoptysis, shortness of breath and difficulty breathing.   Cardiovascular: Negative for chest pain, palpitations, hypertension and swelling in legs/feet.  Gastrointestinal: Negative for blood in stool, constipation and diarrhea.  Endocrine: Negative for increased urination.  Genitourinary: Negative for painful urination.  Musculoskeletal: Positive for arthralgias, joint pain, myalgias, morning stiffness, muscle tenderness and myalgias. Negative for joint  swelling and muscle weakness.  Skin: Negative for color change, pallor, rash, hair loss, nodules/bumps, skin tightness, ulcers and sensitivity to sunlight.  Allergic/Immunologic: Negative for susceptible to infections.  Neurological: Negative for dizziness, numbness, headaches and weakness.  Hematological: Negative for swollen glands.  Psychiatric/Behavioral: Positive for depressed mood. Negative for sleep disturbance. The patient is not nervous/anxious.     PMFS History:  Patient Active Problem List   Diagnosis Date Noted  . Sjogren's syndrome ([place]) 09/22/2016  . Fibromyalgia 09/22/2016  . Chronic pain syndrome 09/22/2016  . Osteoarthritis of both hands 09/22/2016  . Migraine 09/22/2016  . Schizophrenia, paranoid type ([place]) 04/25/2012  . DEPRESSION 10/21/2010  . OVERWEIGHT/OBESITY 12/18/2009  . Hypertension 12/18/2009  . Secondary cardiomyopathy ([place]) 12/18/2009    Past Medical History:  Diagnosis Date  . Cardiomyopathy   . Depression   . Fibromyalgia   . Hypertension     Family History  Problem Relation Age of Onset  . Hypertension Mother    Past Surgical History:  Procedure Laterality Date  . ABDOMINAL HYSTERECTOMY     Social History   Social History Narrative  . Not on file    There is no immunization history on file for this patient.   Objective: Vital Signs: BP 120/79 (BP Location: Left Arm, Patient Position: Sitting, Cuff Size: Normal)   Pulse 77   Resp 15   Ht 5\' 5"  (1.651 m)   Wt 236 lb (107 kg)   BMI 39.27 kg/m     Physical Exam Vitals signs and nursing note reviewed.  Constitutional:  Appearance: She is well-developed.  HENT:     Head: Normocephalic and atraumatic.  Eyes:     Conjunctiva/sclera: Conjunctivae normal.  Neck:     Musculoskeletal: Normal range of motion.  Cardiovascular:     Rate and Rhythm: Normal rate and regular rhythm.     Heart sounds: Normal heart sounds.  Pulmonary:     Effort: Pulmonary effort is normal.      Breath sounds: Normal breath sounds.  Abdominal:     General: Bowel sounds are normal.     Palpations: Abdomen is soft.  Lymphadenopathy:     Cervical: No cervical adenopathy.  Skin:    General: Skin is warm and dry.     Capillary Refill: Capillary refill takes less than 2 seconds.  Neurological:     Mental Status: She is alert and oriented to person, place, and time.  Psychiatric:        Behavior: Behavior normal.      Musculoskeletal Exam: C-spine, thoracic spine, spine good range of motion.  No midline spinal tenderness.  No SI joint tenderness.  Shoulder joints, elbow joints, wrist joints, MCPs, PIPs, DIPs good range of motion with no synovitis.  She has complete fist formation bilaterally.  Hip joints have good range of motion with no discomfort.  She has tenderness over bilateral trochanteric bursa.  Knee joints have good range of motion with no warmth or effusion.  She has bilateral knee crepitus.  Ankle joints have good range of motion with no warmth or effusion.  CDAI Exam: CDAI Score: Not documented Patient Global Assessment: Not documented; Provider Global Assessment: Not documented Swollen: Not documented; Tender: Not documented Joint Exam   Not documented   There is currently no information documented on the homunculus. Go to the Rheumatology activity and complete the homunculus joint exam.  Investigation: No additional findings.  Imaging: No results found.  Recent Labs: Lab Results  Component Value Date   WBC 5.4 04/23/2012   HGB 14.2 04/23/2012   PLT 229 04/23/2012   NA 140 04/30/2012   K 3.9 04/30/2012   CL 105 04/30/2012   CO2 27 04/30/2012   GLUCOSE 90 04/30/2012   BUN 15 04/30/2012   CREATININE 0.79 04/30/2012   BILITOT 0.1 (L) 04/27/2012   ALKPHOS 70 04/27/2012   AST 13 04/27/2012   ALT 14 04/27/2012   PROT 7.0 04/27/2012   ALBUMIN 3.6 04/27/2012   CALCIUM 9.4 04/30/2012   GFRAA >90 04/30/2012    Speciality Comments: No specialty comments  available.  Procedures:  No procedures performed Allergies: Sulfonamide derivatives   Assessment / Plan:     Visit Diagnoses: Sjogren's syndrome, with unspecified organ involvement ([place]) - Positive ANA, positive Ro:  She has chronic eye dryness but no mouth dryness.  She has no new or worsening symptoms.  She has no other features of autoimmune disease at this time.   Fibromyalgia - She has generalized muscle aches and muscle tenderness due to fibromyalgia.  Overall her fibromyalgia has been well controlled. She has had less frequent flares.  She has been sleeping well at night-8 hours.  Her level of fatige has improved since she was recently laid off.  She takes Topamax 100 mg 1 1/2 tablets po daily and Gabapentin 100 mg at bedtime.   Chronic pain syndrome: She takes Gabapentin 100 mg po at bedtime, which helps with her pain night.   Primary osteoarthritis of both hands: She has intermittent pain and stiffness in both hands.  She has  no synovitis on exam.  She has complete fist formation bilaterally.  Joint protection and muscle strengthening were discussed.    Trochanteric bursitis of both hips She has tenderness over bilateral trochanteric bursa.  She was encouraged to perform stretching exercises regularly.  She uses voltaren gel topically PRN for pain relief.  A refill was sent to the pharmacy.    Other medical conditions are listed as follows:   Essential hypertension  Secondary cardiomyopathy   Migraines  History of hypertension   Orders: No orders of the defined types were placed in this encounter.  Meds ordered this encounter  Medications  . diclofenac sodium (VOLTAREN) 1 % GEL    Sig: Apply 2 grams to 4 grams to affected area up to 4 times daily as needed    Dispense:  4 Tube    Refill:  2    Patient would like brand name only.    Face-to-face time spent with patient was 15 minutes. Greater than 50% of time was spent in counseling and coordination of care.   Follow-Up Instructions: Return in about 6 months (around 09/12/2019) for Fibromyalgia, Sjogren's syndrome, Osteoarthritis.   Ofilia Neas, PA-C   I examined and evaluated the patient with Hazel Sams PA.  Patient continues to have some generalized pain and discomfort with fibromyalgia.  She also has osteoarthritis which causes discomfort.  She has been experiencing IT band discomfort for which exercises were discussed.  Sicca symptoms which are manageable.  She will get labs with her PCP which will include CBC, CMP and UA.  The plan of care was discussed as noted above.  Bo Merino, MD  Note - This record has been created using Editor, commissioning.  Chart creation errors have been sought, but may not always  have been located. Such creation errors do not reflect on  the standard of medical care.

## 2019-03-12 ENCOUNTER — Encounter: Payer: Self-pay | Admitting: Rheumatology

## 2019-03-12 ENCOUNTER — Other Ambulatory Visit: Payer: Self-pay

## 2019-03-12 ENCOUNTER — Ambulatory Visit: Payer: BLUE CROSS/BLUE SHIELD | Admitting: Rheumatology

## 2019-03-12 VITALS — BP 120/79 | HR 77 | Resp 15 | Ht 65.0 in | Wt 236.0 lb

## 2019-03-12 DIAGNOSIS — M19042 Primary osteoarthritis, left hand: Secondary | ICD-10-CM

## 2019-03-12 DIAGNOSIS — M797 Fibromyalgia: Secondary | ICD-10-CM | POA: Diagnosis not present

## 2019-03-12 DIAGNOSIS — M3500 Sicca syndrome, unspecified: Secondary | ICD-10-CM | POA: Diagnosis not present

## 2019-03-12 DIAGNOSIS — I429 Cardiomyopathy, unspecified: Secondary | ICD-10-CM

## 2019-03-12 DIAGNOSIS — G894 Chronic pain syndrome: Secondary | ICD-10-CM

## 2019-03-12 DIAGNOSIS — G43809 Other migraine, not intractable, without status migrainosus: Secondary | ICD-10-CM

## 2019-03-12 DIAGNOSIS — M19041 Primary osteoarthritis, right hand: Secondary | ICD-10-CM | POA: Diagnosis not present

## 2019-03-12 DIAGNOSIS — Z8679 Personal history of other diseases of the circulatory system: Secondary | ICD-10-CM

## 2019-03-12 DIAGNOSIS — M7061 Trochanteric bursitis, right hip: Secondary | ICD-10-CM

## 2019-03-12 DIAGNOSIS — I10 Essential (primary) hypertension: Secondary | ICD-10-CM

## 2019-03-12 DIAGNOSIS — M7062 Trochanteric bursitis, left hip: Secondary | ICD-10-CM

## 2019-03-12 MED ORDER — DICLOFENAC SODIUM 1 % TD GEL: TRANSDERMAL | 2 refills | Status: DC

## 2019-03-12 NOTE — Patient Instructions (Signed)

## 2019-03-13 ENCOUNTER — Ambulatory Visit: Payer: BLUE CROSS/BLUE SHIELD | Admitting: Psychiatry

## 2019-03-13 ENCOUNTER — Other Ambulatory Visit: Payer: Self-pay

## 2019-03-13 ENCOUNTER — Encounter: Payer: Self-pay | Admitting: Psychiatry

## 2019-03-13 DIAGNOSIS — F333 Major depressive disorder, recurrent, severe with psychotic symptoms: Secondary | ICD-10-CM

## 2019-03-13 DIAGNOSIS — F411 Generalized anxiety disorder: Secondary | ICD-10-CM | POA: Diagnosis not present

## 2019-03-13 NOTE — Progress Notes (Signed)
Brittany Sweeney [id number] [birth year] 58 y.o.  Virtual Visit via Telephone Note  I connected with pt by telephone and verified that I am speaking with the correct person using two identifiers.   I discussed the limitations, risks, security and privacy concerns of performing an evaluation and management service by telephone and the availability of in person appointments. I also discussed with the patient that there may be a patient responsible charge related to this service. The patient expressed understanding and agreed to proceed.  I discussed the assessment and treatment plan with the patient. The patient was provided an opportunity to ask questions and all were answered. The patient agreed with the plan and demonstrated an understanding of the instructions.   The patient was advised to call back or seek an in-person evaluation if the symptoms worsen or if the condition fails to improve as anticipated.  I provided 15 minutes of non-face-to-face time during this encounter. The call started at 415 and ended at 430. The patient was located at home and the provider was located office.  Subjective:   [name] is a 58 y.o. (DOB 20-Feb-1961) female.  Chief Complaint:  Chief Complaint  Patient presents with  . Follow-up    med mangement    HPI Brittany Sweeney presents  today for follow-up of depression and anxiety.   Last seen September 19, 2018.  No meds were changed   Doing pretty good overall.  Out of work since March 27 and is bored.  She should be hired back soon.  Less stressed out of work.    Patient reports stable mood and denies depressed or irritable moods. A little restless.  Patient denies any recent difficulty with anxiety.  Patient denies difficulty usually with sleep initiation or maintenance. 8-9 hours.  Denies appetite disturbance.  Patient reports that energy and motivation have been good.  Patient denies any difficulty with concentration.  Patient  denies any suicidal ideation.  No fear.  Past Psychiatric Medication Trials: Sertraline cognitive side effects, n Wellbutrin 300, risperidone 1.5, gabapentin History of psychiatric hospitalization with depression, suicidal thoughts, and paranoia..  Long history of recurrent depression and paranoia  Review of Systems:  Review of Systems  Skin:       Skin crawling  Neurological: Negative for tremors and weakness.  Psychiatric/Behavioral: Negative for agitation, behavioral problems, confusion, decreased concentration, dysphoric mood, hallucinations, self-injury, sleep disturbance and suicidal ideas. The patient is not nervous/anxious and is not hyperactive.     Medications: I have reviewed the patient's current medications.  Current Outpatient Medications  Medication Sig Dispense Refill  . buPROPion (WELLBUTRIN XL) 300 MG 24 hr tablet Take 1 tablet (300 mg total) by mouth daily. 90 tablet 3  . carvedilol (COREG) 6.25 MG tablet TAKE 1 TABLET (6.25 MG TOTAL) BY MOUTH 2 (TWO) TIMES DAILY WITH A MEAL. 180 tablet 3  . diclofenac sodium (VOLTAREN) 1 % GEL Apply 2 grams to 4 grams to affected area up to 4 times daily as needed 4 Tube 2  . gabapentin (NEURONTIN) 100 MG capsule Take 100 mg by mouth at bedtime.    . risperiDONE (RISPERDAL) 3 MG tablet Take 1/2 tablet (1.5 mg total) each night. 45 tablet 1  . nystatin-triamcinolone ointment (MYCOLOG) Apply 1 application topically 2 (two) times daily. (Patient taking differently: Apply 1 application topically as needed. ) 30 g 11  . topiramate (TOPAMAX) 100 MG tablet TAKE 1 AND 1/2 TABLETS BY MOUTH ONCE DAILY (Patient taking  differently: Take 100 mg by mouth daily. TAKE 1 AND 1/2 TABLETS BY MOUTH ONCE DAILY) 135 tablet 0   No current facility-administered medications for this visit.     Medication Side Effects: None  Allergies:  Allergies  Allergen Reactions  . Sulfonamide Derivatives Hives    Past Medical History:  Diagnosis Date  .  Cardiomyopathy   . Depression   . Fibromyalgia   . Hypertension     Family History  Problem Relation Age of Onset  . Hypertension Mother     Social History   Socioeconomic History  . Marital status: Married    Spouse name: Not on file  . Number of children: Not on file  . Years of education: Not on file  . Highest education level: Not on file  Occupational History  . Not on file  Social Needs  . Financial resource strain: Not on file  . Food insecurity:    Worry: Not on file    Inability: Not on file  . Transportation needs:    Medical: Not on file    Non-medical: Not on file  Tobacco Use  . Smoking status: Never Smoker  . Smokeless tobacco: Never Used  Substance and Sexual Activity  . Alcohol use: No    Alcohol/week: 0.0 standard drinks  . Drug use: Never  . Sexual activity: Yes    Birth control/protection: Surgical  Lifestyle  . Physical activity:    Days per week: Not on file    Minutes per session: Not on file  . Stress: Not on file  Relationships  . Social connections:    Talks on phone: Not on file    Gets together: Not on file    Attends religious service: Not on file    Active member of club or organization: Not on file    Attends meetings of clubs or organizations: Not on file    Relationship status: Not on file  . Intimate partner violence:    Fear of current or ex partner: Not on file    Emotionally abused: Not on file    Physically abused: Not on file    Forced sexual activity: Not on file  Other Topics Concern  . Not on file  Social History Narrative  . Not on file    Past Medical History, Surgical history, Social history, and Family history were reviewed and updated as appropriate.   Please see review of systems for further details on the patient's review from today.   Objective:   Physical Exam:  There were no vitals taken for this visit.  Physical Exam Neurological:     Mental Status: She is alert and oriented to person, place,  and time.     Cranial Nerves: No dysarthria.  Psychiatric:        Attention and Perception: Attention normal.        Mood and Affect: Mood normal.        Speech: Speech normal.        Behavior: Behavior is cooperative.        Thought Content: Thought content normal. Thought content is not paranoid or delusional. Thought content does not include homicidal or suicidal ideation. Thought content does not include homicidal or suicidal plan.        Cognition and Memory: Cognition and memory normal.        Judgment: Judgment normal.     Comments: No concerns about meds but eventually wants to stop when retires.  Lab Review:     Component Value Date/Time   NA 140 04/30/2012 0613   K 3.9 04/30/2012 0613   CL 105 04/30/2012 0613   CO2 27 04/30/2012 0613   GLUCOSE 90 04/30/2012 0613   BUN 15 04/30/2012 0613   CREATININE 0.79 04/30/2012 0613   CALCIUM 9.4 04/30/2012 0613   PROT 7.0 04/27/2012 1930   ALBUMIN 3.6 04/27/2012 1930   AST 13 04/27/2012 1930   ALT 14 04/27/2012 1930   ALKPHOS 70 04/27/2012 1930   BILITOT 0.1 (L) 04/27/2012 1930   GFRNONAA >90 04/30/2012 0613   GFRAA >90 04/30/2012 0613       Component Value Date/Time   WBC 5.4 04/23/2012 2131   RBC 4.54 04/23/2012 2131   HGB 14.2 04/23/2012 2131   HCT 40.0 04/23/2012 2131   PLT 229 04/23/2012 2131   MCV 88.1 04/23/2012 2131   MCH 31.3 04/23/2012 2131   MCHC 35.5 04/23/2012 2131   RDW 12.4 04/23/2012 2131   LYMPHSABS 2.1 04/23/2012 2131   MONOABS 0.4 04/23/2012 2131   EOSABS 0.1 04/23/2012 2131   BASOSABS 0.0 04/23/2012 2131    No results found for: POCLITH, LITHIUM   No results found for: PHENYTOIN, PHENOBARB, VALPROATE, CBMZ   .res Assessment: Plan:    Severe recurrent major depression with psychotic features ([place])  Generalized anxiety disorder   SX in remission with the meds.  She's pleased and satisfied.  History of relapse with attempts to decrease the Risperidone.  Therefore need to continue the  current dosage.  She wants to consider coming off some of the medication but it would be risky to do so.  She agrees to defer any medicine reduction until after retirement.  Discussed potential metabolic side effects associated with atypical antipsychotics, as well as potential risk for movement side effects. Advised pt to contact office if movement side effects occur.  Labs per pcp.  No problems with the wellbutrin xl 300.  FU 6 mos  Lynder Parents, MD, DFAPA   Please see After Visit Summary for patient specific instructions.  Future Appointments  Date Time Provider [place]  09/12/2019  3:20 PM Ofilia Neas, PA-C [facility] None    No orders of the defined types were placed in this encounter.     -------------------------------

## 2019-03-19 ENCOUNTER — Ambulatory Visit: Payer: Self-pay | Admitting: Rheumatology

## 2019-04-04 DIAGNOSIS — M797 Fibromyalgia: Secondary | ICD-10-CM | POA: Diagnosis not present

## 2019-04-04 DIAGNOSIS — I10 Essential (primary) hypertension: Secondary | ICD-10-CM | POA: Diagnosis not present

## 2019-04-04 DIAGNOSIS — I429 Cardiomyopathy, unspecified: Secondary | ICD-10-CM | POA: Diagnosis not present

## 2019-04-04 DIAGNOSIS — Z0001 Encounter for general adult medical examination with abnormal findings: Secondary | ICD-10-CM | POA: Diagnosis not present

## 2019-04-05 DIAGNOSIS — I10 Essential (primary) hypertension: Secondary | ICD-10-CM | POA: Diagnosis not present

## 2019-04-05 DIAGNOSIS — M797 Fibromyalgia: Secondary | ICD-10-CM | POA: Diagnosis not present

## 2019-04-24 ENCOUNTER — Other Ambulatory Visit: Payer: Self-pay | Admitting: Rheumatology

## 2019-04-24 NOTE — Telephone Encounter (Signed)
Last Visit: 03/12/19 Next Visit: 09/12/19  Okay to refill per Dr. Estanislado Pandy

## 2019-06-30 IMAGING — MG DIGITAL SCREENING BILATERAL MAMMOGRAM WITH TOMO AND CAD
6 of 10 series · 6 of 30 positions shown · non-contrast
Comparison: Previous exam(s).

CLINICAL DATA: Screening.

EXAM:
DIGITAL SCREENING BILATERAL MAMMOGRAM WITH TOMO AND CAD

[L CC synth-2D]
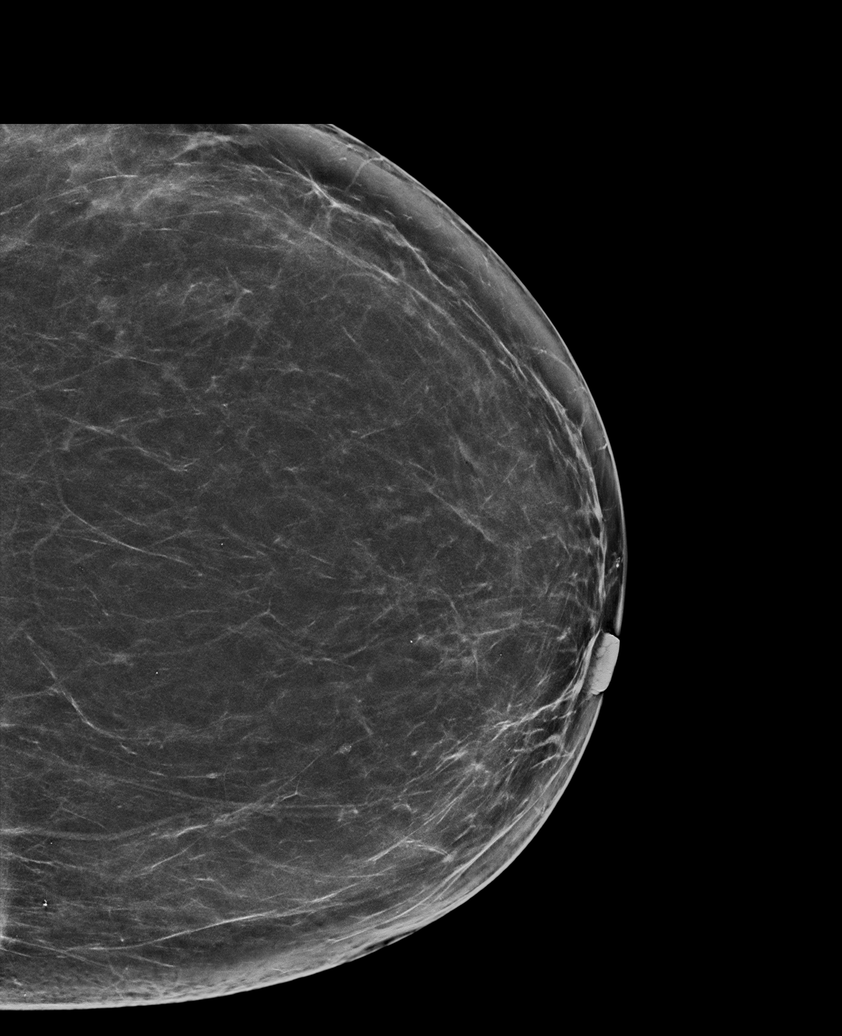

[R CC synth-2D]
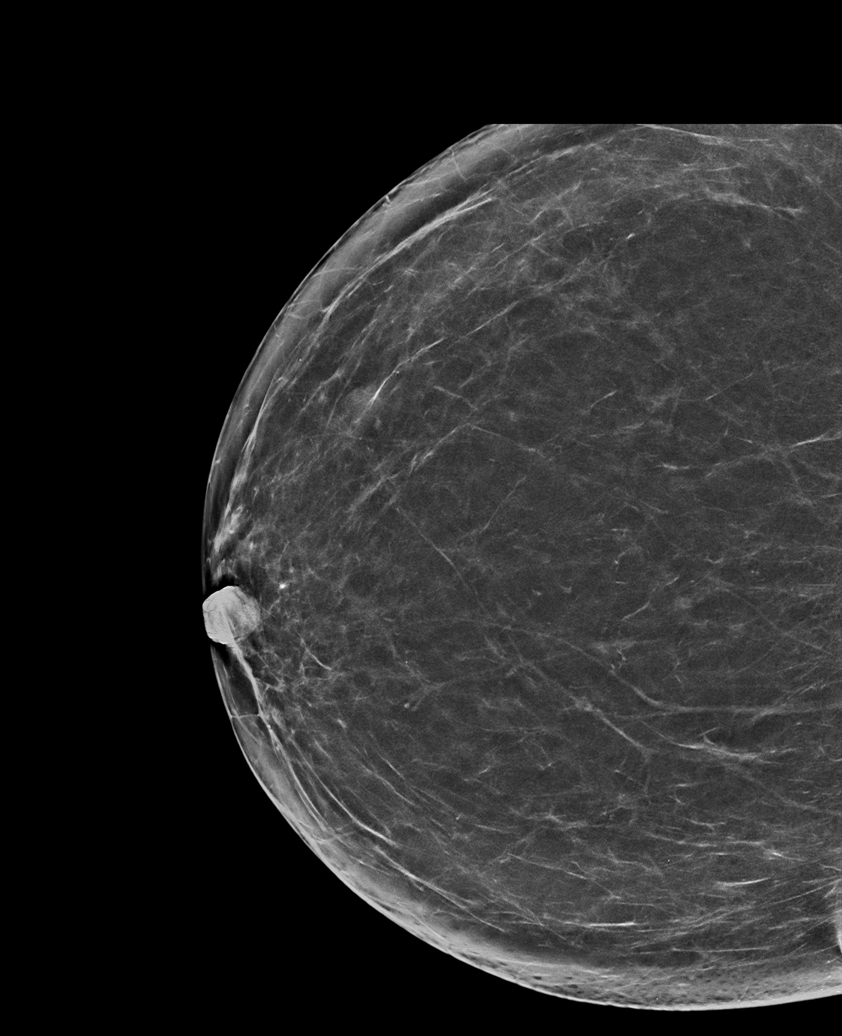

[R MLO synth-2D]
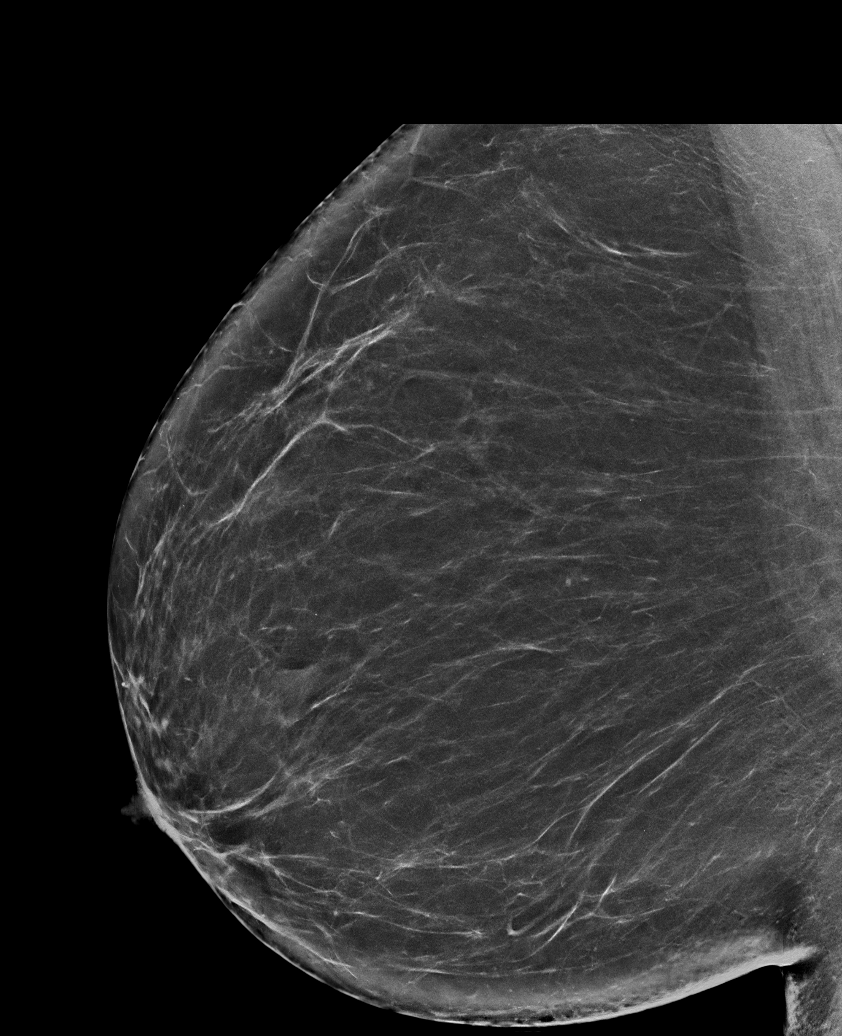

[L MLO synth-2D (1 of 2)]
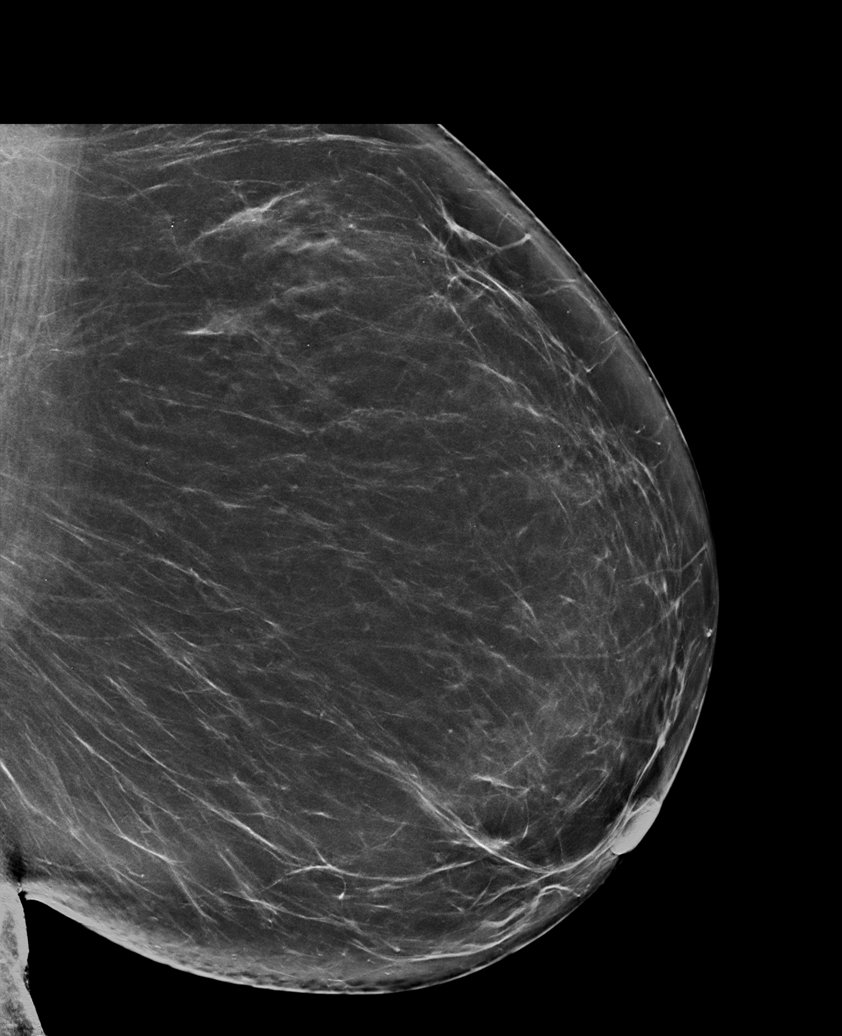

[L MLO synth-2D (2 of 2)]
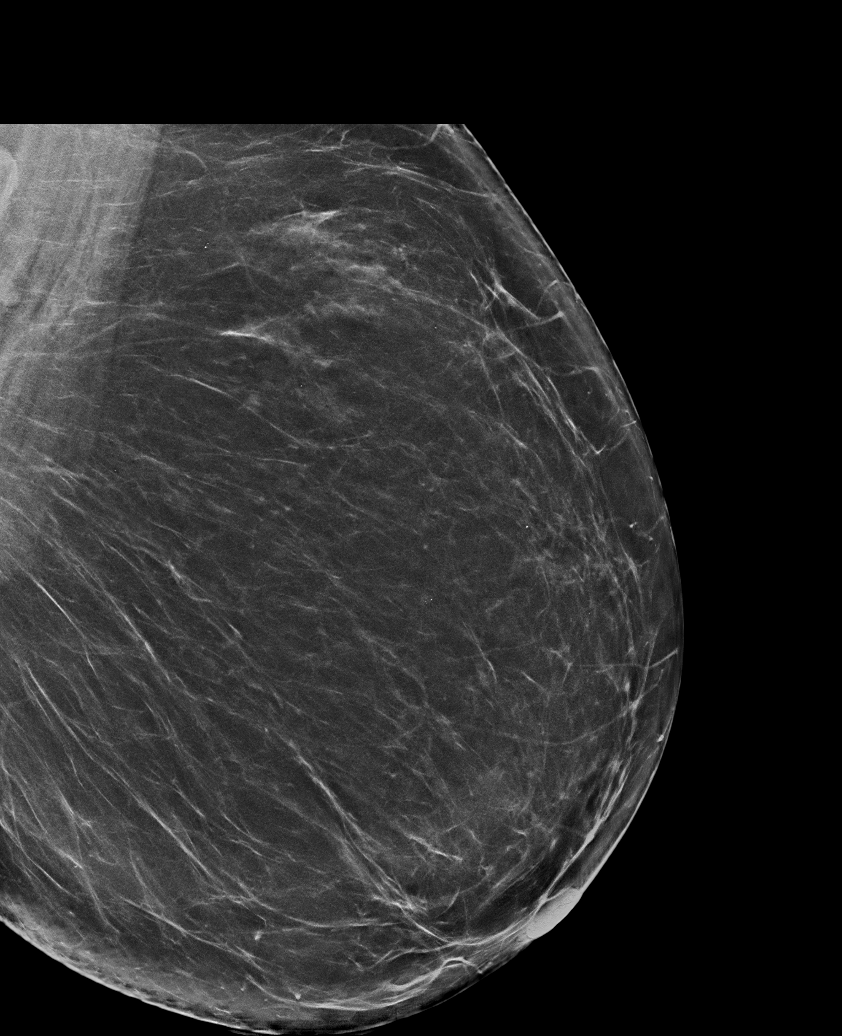

[L MLO tomo · tomo slice 45/88.0]
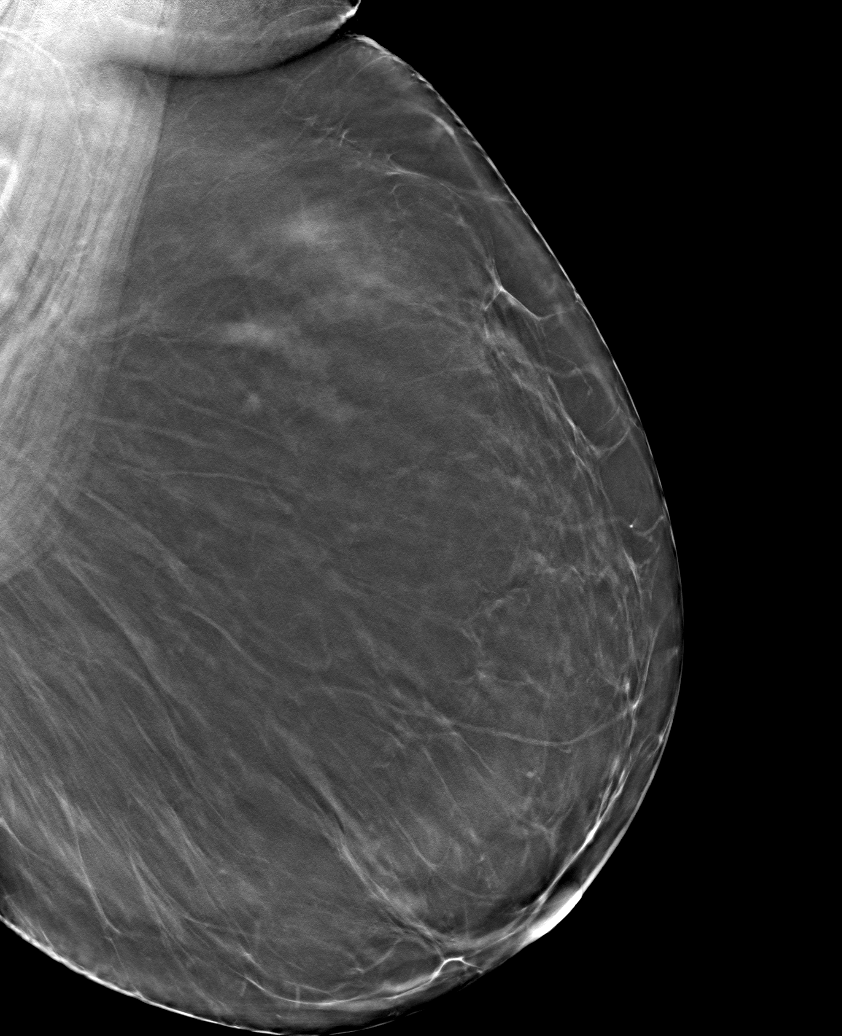

[6 of 30 positions shown; findings below may reference images not displayed]

ACR Breast Density Category b: There are scattered areas of
fibroglandular density.
FINDINGS: There are no findings suspicious for malignancy. Images were
processed with CAD.
IMPRESSION: No mammographic evidence of malignancy. A result letter of this
screening mammogram will be mailed directly to the patient.

RECOMMENDATION:
Screening mammogram in one year. (Code:[id number])

BI-RADS CATEGORY  1: Negative.

## 2019-07-09 DIAGNOSIS — I429 Cardiomyopathy, unspecified: Secondary | ICD-10-CM | POA: Diagnosis not present

## 2019-07-09 DIAGNOSIS — M797 Fibromyalgia: Secondary | ICD-10-CM | POA: Diagnosis not present

## 2019-07-09 DIAGNOSIS — F200 Paranoid schizophrenia: Secondary | ICD-10-CM | POA: Diagnosis not present

## 2019-07-09 DIAGNOSIS — I10 Essential (primary) hypertension: Secondary | ICD-10-CM | POA: Diagnosis not present

## 2019-07-10 ENCOUNTER — Other Ambulatory Visit: Payer: Self-pay | Admitting: Psychiatry

## 2019-07-20 ENCOUNTER — Other Ambulatory Visit: Payer: Self-pay | Admitting: Rheumatology

## 2019-07-22 NOTE — Telephone Encounter (Signed)
Last Visit: 03/12/19 Next Visit: 09/12/19  Okay to refill per Dr. Deveshwar 

## 2019-08-29 NOTE — Progress Notes (Deleted)
   Office Visit Note  Patient: Brittany Sweeney             Date of Birth: 1961-02-23           MRN: [id number]             PCP: Rosita Fire, MD Referring: Rosita Fire, MD Visit Date: 09/12/2019 Occupation: @GUAROCC @  Subjective:  No chief complaint on file.   History of Present Illness: Brittany Sweeney is a 58 y.o. female ***   Activities of Daily Living:  Patient reports morning stiffness for *** {minute/hour:19697}.   Patient {ACTIONS;DENIES/REPORTS:21021675::"Denies"} nocturnal pain.  Difficulty dressing/grooming: {ACTIONS;DENIES/REPORTS:21021675::"Denies"} Difficulty climbing stairs: {ACTIONS;DENIES/REPORTS:21021675::"Denies"} Difficulty getting out of chair: {ACTIONS;DENIES/REPORTS:21021675::"Denies"} Difficulty using hands for taps, buttons, cutlery, and/or writing: {ACTIONS;DENIES/REPORTS:21021675::"Denies"}  No Rheumatology ROS completed.   PMFS History:  Patient Active Problem List   Diagnosis Date Noted  . Sjogren's syndrome ([place]) 09/22/2016  . Fibromyalgia 09/22/2016  . Chronic pain syndrome 09/22/2016  . Osteoarthritis of both hands 09/22/2016  . Migraine 09/22/2016  . Schizophrenia, paranoid type ([place]) 04/25/2012  . DEPRESSION 10/21/2010  . OVERWEIGHT/OBESITY 12/18/2009  . Hypertension 12/18/2009  . Secondary cardiomyopathy ([place]) 12/18/2009    Past Medical History:  Diagnosis Date  . Cardiomyopathy   . Depression   . Fibromyalgia   . Hypertension     Family History  Problem Relation Age of Onset  . Hypertension Mother    Past Surgical History:  Procedure Laterality Date  . ABDOMINAL HYSTERECTOMY     Social History   Social History Narrative  . Not on file    There is no immunization history on file for this patient.   Objective: Vital Signs: There were no vitals taken for this visit.   Physical Exam   Musculoskeletal Exam: ***  CDAI Exam: CDAI Score: - Patient Global: -; Provider Global: - Swollen: -; Tender: - Joint Exam   No joint exam has been documented for this visit   There is currently no information documented on the homunculus. Go to the Rheumatology activity and complete the homunculus joint exam.  Investigation: No additional findings.  Imaging: No results found.  Recent Labs: Lab Results  Component Value Date   WBC 5.4 04/23/2012   HGB 14.2 04/23/2012   PLT 229 04/23/2012   NA 140 04/30/2012   K 3.9 04/30/2012   CL 105 04/30/2012   CO2 27 04/30/2012   GLUCOSE 90 04/30/2012   BUN 15 04/30/2012   CREATININE 0.79 04/30/2012   BILITOT 0.1 (L) 04/27/2012   ALKPHOS 70 04/27/2012   AST 13 04/27/2012   ALT 14 04/27/2012   PROT 7.0 04/27/2012   ALBUMIN 3.6 04/27/2012   CALCIUM 9.4 04/30/2012   GFRAA >90 04/30/2012    Speciality Comments: No specialty comments available.  Procedures:  No procedures performed Allergies: Sulfonamide derivatives   Assessment / Plan:     Visit Diagnoses: No diagnosis found.  Orders: No orders of the defined types were placed in this encounter.  No orders of the defined types were placed in this encounter.   Face-to-face time spent with patient was *** minutes. Greater than 50% of time was spent in counseling and coordination of care.  Follow-Up Instructions: No follow-ups on file.   Ofilia Neas, PA-C  Note - This record has been created using Dragon software.  Chart creation errors have been sought, but may not always  have been located. Such creation errors do not reflect on  the standard of medical care.

## 2019-09-01 ENCOUNTER — Other Ambulatory Visit: Payer: Self-pay | Admitting: Psychiatry

## 2019-09-09 NOTE — Progress Notes (Signed)
Office Visit Note  Patient: Brittany Sweeney             Date of Birth: 08-11-1961           MRN: [id number]             PCP: Rosita Fire, MD Referring: Rosita Fire, MD Visit Date: 09/11/2019 Occupation: @GUAROCC @  Subjective:  Trochanteric bursitis bilaterally   History of Present Illness: Brittany Sweeney is a 58 y.o. female with history of sjogren's syndrome, fibromyalgia, and osteoarthritis.  She continues to have eye dryness and uses eyedrops on a daily basis.  She denies any mouth dryness at this time.  She continues to have generalized muscle aches and muscle tenderness due to fibromyalgia.  She has had interrupted sleep at night but states that her fatigue has been stable recently.  She continues to have trochanter bursitis bilaterally and perform stretching exercises on a regular basis.  She is taking gabapentin and Topamax as prescribed which seems to be managing her symptoms.  She continues to have pain in both hands but denies any joint swelling.  Activities of Daily Living:  Patient reports morning stiffness for 5 minutes.   Patient Reports nocturnal pain.  Difficulty dressing/grooming: Reports Difficulty climbing stairs: Denies Difficulty getting out of chair: Denies Difficulty using hands for taps, buttons, cutlery, and/or writing: Denies  Review of Systems  Constitutional: Positive for fatigue.  HENT: Negative for mouth sores, mouth dryness and nose dryness.   Eyes: Positive for dryness. Negative for itching.  Respiratory: Negative for shortness of breath, wheezing and difficulty breathing.   Cardiovascular: Negative for chest pain and palpitations.  Gastrointestinal: Negative for abdominal pain, blood in stool, constipation and diarrhea.  Endocrine: Negative for increased urination.  Genitourinary: Negative for difficulty urinating and painful urination.  Musculoskeletal: Positive for arthralgias, joint pain and morning stiffness. Negative for joint swelling.   Skin: Negative for rash and hair loss.  Allergic/Immunologic: Negative for susceptible to infections.  Neurological: Negative for dizziness, numbness, headaches, memory loss and weakness.  Hematological: Positive for bruising/bleeding tendency.  Psychiatric/Behavioral: Negative for confusion and sleep disturbance.    PMFS History:  Patient Active Problem List   Diagnosis Date Noted  . Sjogren's syndrome ([place]) 09/22/2016  . Fibromyalgia 09/22/2016  . Chronic pain syndrome 09/22/2016  . Osteoarthritis of both hands 09/22/2016  . Migraine 09/22/2016  . Schizophrenia, paranoid type ([place]) 04/25/2012  . DEPRESSION 10/21/2010  . OVERWEIGHT/OBESITY 12/18/2009  . Hypertension 12/18/2009  . Secondary cardiomyopathy ([place]) 12/18/2009    Past Medical History:  Diagnosis Date  . Cardiomyopathy   . Depression   . Fibromyalgia   . Hypertension     Family History  Problem Relation Age of Onset  . Hypertension Mother    Past Surgical History:  Procedure Laterality Date  . ABDOMINAL HYSTERECTOMY     Social History   Social History Narrative  . Not on file    There is no immunization history on file for this patient.   Objective: Vital Signs: BP 120/68 (BP Location: Left Arm, Patient Position: Sitting, Cuff Size: Normal)   Pulse 74   Resp 15   Ht 5\' 5"  (1.651 m)   Wt 230 lb (104.3 kg)   BMI 38.27 kg/m    Physical Exam Vitals signs and nursing note reviewed.  Constitutional:      Appearance: She is well-developed.  HENT:     Head: Normocephalic and atraumatic.  Eyes:     Conjunctiva/sclera: Conjunctivae normal.  Neck:     Musculoskeletal: Normal range of motion.  Cardiovascular:     Rate and Rhythm: Normal rate and regular rhythm.     Heart sounds: Normal heart sounds.  Pulmonary:     Effort: Pulmonary effort is normal.     Breath sounds: Normal breath sounds.  Abdominal:     General: Bowel sounds are normal.     Palpations: Abdomen is soft.  Lymphadenopathy:      Cervical: No cervical adenopathy.  Skin:    General: Skin is warm and dry.     Capillary Refill: Capillary refill takes less than 2 seconds.  Neurological:     Mental Status: She is alert and oriented to person, place, and time.  Psychiatric:        Behavior: Behavior normal.      Musculoskeletal Exam: C-spine, thoracic spine, and lumbar spine good range of motion.  Shoulder joints, elbow joints, wrist joints, MCPs, PIPs and DIPs good range of motion with no synovitis.  She has PIP and DIP synovial thickening consistent with osteoarthritis of both hands.  Hip joints have good range of motion.  She has tenderness over bilateral trochanteric bursa.  Knee joints have good range of motion with no warmth or effusion.  Ankle joints have good range of motion with no tenderness or inflammation.  CDAI Exam: CDAI Score: - Patient Global: -; Provider Global: - Swollen: -; Tender: - Joint Exam   No joint exam has been documented for this visit   There is currently no information documented on the homunculus. Go to the Rheumatology activity and complete the homunculus joint exam.  Investigation: No additional findings.  Imaging: No results found.  Recent Labs: Lab Results  Component Value Date   WBC 5.4 04/23/2012   HGB 14.2 04/23/2012   PLT 229 04/23/2012   NA 140 04/30/2012   K 3.9 04/30/2012   CL 105 04/30/2012   CO2 27 04/30/2012   GLUCOSE 90 04/30/2012   BUN 15 04/30/2012   CREATININE 0.79 04/30/2012   BILITOT 0.1 (L) 04/27/2012   ALKPHOS 70 04/27/2012   AST 13 04/27/2012   ALT 14 04/27/2012   PROT 7.0 04/27/2012   ALBUMIN 3.6 04/27/2012   CALCIUM 9.4 04/30/2012   GFRAA >90 04/30/2012    Speciality Comments: No specialty comments available.  Procedures:  No procedures performed Allergies: Sulfonamide derivatives   Assessment / Plan:     Visit Diagnoses: Sjogren's syndrome, with unspecified organ involvement ([place]) - Positive ANA, positive Ro: She continues to have  chronic eye dryness and uses eyedrops on a daily basis.  She has no mouth dryness at this time.  No parotid swelling or tenderness noted.  UA, CBC, CMP were within normal limits in June 2020.  We will check autoimmune lab work at the next visit.  Future orders were placed today.  She will follow-up in the office in 6 months.- Plan: CBC, CMP, UA w reflex microscopic, Serum protein electrophoresis with reflex, RF  Fibromyalgia -she has generalized hyperalgesia and positive tender points on exam.  She continues have generalized muscle aches muscle tenderness due to fibromyalgia.  She is currently having a fibromyalgia flare.  She has been having more frequent flares with the frequent weather changes.  She continues to take Topamax 100 mg 1.5 tablets by mouth daily and gabapentin 100 mg 1 capsule by mouth at bedtime which seem to be have managing her symptoms.  She was encouraged to stay active and exercise on a regular  basis.  She has persistent trochanteric bursitis bilaterally.  She was given a handout of exercises to perform.    Chronic pain syndrome - She takes Gabapentin 100 mg po at bedtime.   Primary osteoarthritis of both hands: She has PIP and DIP synovial thickening consistent osteoarthritis of both hands.  She has no tenderness or synovitis on exam.  She has complete fist formation bilaterally.  Joint protection and muscle strengthening were discussed.  Trochanteric bursitis of both hips: She has tenderness over bilateral trochanteric bursa.  She experiences nocturnal pain since she has a side sleeper.  She has a comfortable mattress at home.  She perform stretching exercises occasionally.  She was given a handout of exercises to perform.  Other medical conditions are listed as follows:  Secondary cardiomyopathy   Essential hypertension  Migraines  Orders: Orders Placed This Encounter  Procedures  . CBC  . CMP  . UA w reflex microscopic  . Serum protein electrophoresis with reflex  .  RF  . ANA   No orders of the defined types were placed in this encounter.    Follow-Up Instructions: Return in about 6 months (around 03/10/2020) for Sjogren's syndrome, Fibromyalgia, Osteoarthritis.   Ofilia Neas, PA-C   I examined and evaluated the patient with Hazel Sams PA.  Patient continues to have some discomfort from fibromyalgia and osteoarthritis.  She also has history of Sjogren's.  We will obtain labs today.  The plan of care was discussed as noted above.  Bo Merino, MD  Note - This record has been created using Editor, commissioning.  Chart creation errors have been sought, but may not always  have been located. Such creation errors do not reflect on  the standard of medical care.

## 2019-09-10 ENCOUNTER — Other Ambulatory Visit: Payer: Self-pay

## 2019-09-10 ENCOUNTER — Ambulatory Visit (INDEPENDENT_AMBULATORY_CARE_PROVIDER_SITE_OTHER): Payer: BC Managed Care – PPO | Admitting: Psychiatry

## 2019-09-10 ENCOUNTER — Encounter: Payer: Self-pay | Admitting: Psychiatry

## 2019-09-10 DIAGNOSIS — F333 Major depressive disorder, recurrent, severe with psychotic symptoms: Secondary | ICD-10-CM

## 2019-09-10 DIAGNOSIS — F411 Generalized anxiety disorder: Secondary | ICD-10-CM

## 2019-09-10 MED ORDER — GABAPENTIN 100 MG PO CAPS: 100.0000 mg | ORAL_CAPSULE | Freq: Every day | ORAL | 1 refills | Status: DC

## 2019-09-10 NOTE — Progress Notes (Signed)
Brittany Sweeney [id number] June 21, 1961 58 y.o.  Virtual Visit via Telephone Note  I connected with pt by telephone and verified that I am speaking with the correct person using two identifiers.   I discussed the limitations, risks, security and privacy concerns of performing an evaluation and management service by telephone and the availability of in person appointments. I also discussed with the patient that there may be a patient responsible charge related to this service. The patient expressed understanding and agreed to proceed.  I discussed the assessment and treatment plan with the patient. The patient was provided an opportunity to ask questions and all were answered. The patient agreed with the plan and demonstrated an understanding of the instructions.   The patient was advised to call back or seek an in-person evaluation if the symptoms worsen or if the condition fails to improve as anticipated.  I provided 15 minutes of non-face-to-face time during this encounter. The call started at 1030 and ended at 1045. The patient was located at home and the provider was located office.  Subjective:   [name] is a 58 y.o. (DOB Mar 10, 1961) female.  Chief Complaint:  Chief Complaint  Patient presents with  . Follow-up    Medication Management  . Depression    Medication Management  . Schizophrenia    Medication Management  . Medication Refill    Gabapentin    HPI Brittany Sweeney presents  today for follow-up of depression and anxiety.   Last seen May 2020.  No meds were changed.  She continued on Wellbutrin XL 300 mg in the morning, gabapentin 100 nightly, risperidone 3 mg 1/2 tablet nightly, Topamax 100 mg tablets 1-1/2 tablets daily.  Doing really good.  Been working first and second shift and it's been working out well with it.  Sleep is good and better with 2nd shift.    Patient reports stable mood and denies depressed or irritable moods. A little restless.   Patient denies any recent difficulty with anxiety.  Patient denies difficulty usually with sleep initiation or maintenance. 8-9 hours.  Denies appetite disturbance.  Patient reports that energy and motivation have been good.  Patient denies any difficulty with concentration.  Patient denies any suicidal ideation.  No fear.  No voices.  Plans to retire at 58 yo.  Past Psychiatric Medication Trials: Sertraline cognitive side effects, n Wellbutrin 300, risperidone 1.5, gabapentin History of psychiatric hospitalization with depression, suicidal thoughts, and paranoia..  Long history of recurrent depression and paranoia  Review of Systems:  Review of Systems  Skin:       Skin crawling still some but less and all over but mostly trunk.  Neurological: Negative for tremors and weakness.  Psychiatric/Behavioral: Negative for agitation, behavioral problems, confusion, decreased concentration, dysphoric mood, hallucinations, self-injury, sleep disturbance and suicidal ideas. The patient is not nervous/anxious and is not hyperactive.     Medications: I have reviewed the patient's current medications.  Current Outpatient Medications  Medication Sig Dispense Refill  . buPROPion (WELLBUTRIN XL) 300 MG 24 hr tablet TAKE 1 TABLET BY MOUTH EVERY DAY 90 tablet 3  . carvedilol (COREG) 6.25 MG tablet TAKE 1 TABLET (6.25 MG TOTAL) BY MOUTH 2 (TWO) TIMES DAILY WITH A MEAL. 180 tablet 3  . diclofenac sodium (VOLTAREN) 1 % GEL Apply 2 grams to 4 grams to affected area up to 4 times daily as needed 4 Tube 2  . gabapentin (NEURONTIN) 100 MG capsule Take 100 mg by  mouth at bedtime.    [name] nystatin-triamcinolone ointment (MYCOLOG) Apply 1 application topically 2 (two) times daily. (Patient taking differently: Apply 1 application topically as needed. ) 30 g 11  . risperiDONE (RISPERDAL) 3 MG tablet TAKE 1/2 TABLET (1.5 MG TOTAL) EACH NIGHT. 45 tablet 1  . topiramate (TOPAMAX) 100 MG tablet TAKE 1 AND 1/2 TABLETS BY MOUTH  ONCE DAILY 135 tablet 0   No current facility-administered medications for this visit.     Medication Side Effects: None  Allergies:  Allergies  Allergen Reactions  . Sulfonamide Derivatives Hives    Past Medical History:  Diagnosis Date  . Cardiomyopathy   . Depression   . Fibromyalgia   . Hypertension     Family History  Problem Relation Age of Onset  . Hypertension Mother     Social History   Socioeconomic History  . Marital status: Married    Spouse name: Not on file  . Number of children: Not on file  . Years of education: Not on file  . Highest education level: Not on file  Occupational History  . Not on file  Social Needs  . Financial resource strain: Not on file  . Food insecurity    Worry: Not on file    Inability: Not on file  . Transportation needs    Medical: Not on file    Non-medical: Not on file  Tobacco Use  . Smoking status: Never Smoker  . Smokeless tobacco: Never Used  Substance and Sexual Activity  . Alcohol use: No    Alcohol/week: 0.0 standard drinks  . Drug use: Never  . Sexual activity: Yes    Birth control/protection: Surgical  Lifestyle  . Physical activity    Days per week: Not on file    Minutes per session: Not on file  . Stress: Not on file  Relationships  . Social Herbalist on phone: Not on file    Gets together: Not on file    Attends religious service: Not on file    Active member of club or organization: Not on file    Attends meetings of clubs or organizations: Not on file    Relationship status: Not on file  . Intimate partner violence    Fear of current or ex partner: Not on file    Emotionally abused: Not on file    Physically abused: Not on file    Forced sexual activity: Not on file  Other Topics Concern  . Not on file  Social History Narrative  . Not on file    Past Medical History, Surgical history, Social history, and Family history were reviewed and updated as appropriate.   Please  see review of systems for further details on the patient's review from today.   Objective:   Physical Exam:  There were no vitals taken for this visit.  Physical Exam Neurological:     Mental Status: She is alert and oriented to person, place, and time.     Cranial Nerves: No dysarthria.  Psychiatric:        Attention and Perception: Attention normal. She does not perceive auditory hallucinations.        Mood and Affect: Mood normal. Mood is not anxious or depressed.        Speech: Speech normal.        Behavior: Behavior is cooperative.        Thought Content: Thought content normal. Thought content is not paranoid or delusional.  Thought content does not include homicidal or suicidal ideation. Thought content does not include homicidal or suicidal plan.        Cognition and Memory: Cognition and memory normal.        Judgment: Judgment normal.     Comments: No concerns about meds but eventually wants to stop when retires. Fair insight.     Lab Review:     Component Value Date/Time   NA 140 04/30/2012 0613   K 3.9 04/30/2012 0613   CL 105 04/30/2012 0613   CO2 27 04/30/2012 0613   GLUCOSE 90 04/30/2012 0613   BUN 15 04/30/2012 0613   CREATININE 0.79 04/30/2012 0613   CALCIUM 9.4 04/30/2012 0613   PROT 7.0 04/27/2012 1930   ALBUMIN 3.6 04/27/2012 1930   AST 13 04/27/2012 1930   ALT 14 04/27/2012 1930   ALKPHOS 70 04/27/2012 1930   BILITOT 0.1 (L) 04/27/2012 1930   GFRNONAA >90 04/30/2012 0613   GFRAA >90 04/30/2012 0613       Component Value Date/Time   WBC 5.4 04/23/2012 2131   RBC 4.54 04/23/2012 2131   HGB 14.2 04/23/2012 2131   HCT 40.0 04/23/2012 2131   PLT 229 04/23/2012 2131   MCV 88.1 04/23/2012 2131   MCH 31.3 04/23/2012 2131   MCHC 35.5 04/23/2012 2131   RDW 12.4 04/23/2012 2131   LYMPHSABS 2.1 04/23/2012 2131   MONOABS 0.4 04/23/2012 2131   EOSABS 0.1 04/23/2012 2131   BASOSABS 0.0 04/23/2012 2131    No results found for: POCLITH, LITHIUM   No  results found for: PHENYTOIN, PHENOBARB, VALPROATE, CBMZ   .res Assessment: Plan:    Severe recurrent major depression with psychotic features ([place])  Generalized anxiety disorder   SX in remission with the meds.  She's pleased and satisfied.  History of relapse with attempts to decrease the Risperidone.  Therefore need to continue the current dosage.  She wants to consider coming off some of the medication but it would be risky to do so.  She agrees to defer any medicine reduction until after retirement.  Discussed potential metabolic side effects associated with atypical antipsychotics, as well as potential risk for movement side effects. Advised pt to contact office if movement side effects occur.  Labs per pcp.  No problems with the wellbutrin xl 300.  Gabapentin helps for pain.   FU 6 mos  Lynder Parents, MD, DFAPA   Please see After Visit Summary for patient specific instructions.  Future Appointments  Date Time Provider [place]  09/11/2019 10:30 AM Bo Merino, MD [facility] None    No orders of the defined types were placed in this encounter.     -------------------------------

## 2019-09-11 ENCOUNTER — Encounter: Payer: Self-pay | Admitting: Rheumatology

## 2019-09-11 ENCOUNTER — Ambulatory Visit: Payer: BLUE CROSS/BLUE SHIELD | Admitting: Psychiatry

## 2019-09-11 ENCOUNTER — Other Ambulatory Visit: Payer: Self-pay

## 2019-09-11 ENCOUNTER — Ambulatory Visit: Payer: BC Managed Care – PPO | Admitting: Rheumatology

## 2019-09-11 VITALS — BP 120/68 | HR 74 | Resp 15 | Ht 65.0 in | Wt 230.0 lb

## 2019-09-11 DIAGNOSIS — M19041 Primary osteoarthritis, right hand: Secondary | ICD-10-CM

## 2019-09-11 DIAGNOSIS — M797 Fibromyalgia: Secondary | ICD-10-CM | POA: Diagnosis not present

## 2019-09-11 DIAGNOSIS — G894 Chronic pain syndrome: Secondary | ICD-10-CM

## 2019-09-11 DIAGNOSIS — M3500 Sicca syndrome, unspecified: Secondary | ICD-10-CM | POA: Diagnosis not present

## 2019-09-11 DIAGNOSIS — I10 Essential (primary) hypertension: Secondary | ICD-10-CM

## 2019-09-11 DIAGNOSIS — M7062 Trochanteric bursitis, left hip: Secondary | ICD-10-CM

## 2019-09-11 DIAGNOSIS — M7061 Trochanteric bursitis, right hip: Secondary | ICD-10-CM

## 2019-09-11 DIAGNOSIS — M19042 Primary osteoarthritis, left hand: Secondary | ICD-10-CM

## 2019-09-11 DIAGNOSIS — G43809 Other migraine, not intractable, without status migrainosus: Secondary | ICD-10-CM

## 2019-09-11 DIAGNOSIS — I429 Cardiomyopathy, unspecified: Secondary | ICD-10-CM

## 2019-09-11 NOTE — Patient Instructions (Signed)
Iliotibial Band Syndrome Rehab Ask your health care provider which exercises are safe for you. Do exercises exactly as told by your health care provider and adjust them as directed. It is normal to feel mild stretching, pulling, tightness, or discomfort as you do these exercises. Stop right away if you feel sudden pain or your pain gets significantly worse. Do not begin these exercises until told by your health care provider. Stretching and range-of-motion exercises These exercises warm up your muscles and joints and improve the movement and flexibility of your hip and pelvis. Quadriceps stretch, prone  1. Lie on your abdomen on a firm surface, such as a bed or padded floor (prone position). 2. Bend your left / right knee and reach back to hold your ankle or pant leg. If you cannot reach your ankle or pant leg, loop a belt around your foot and grab the belt instead. 3. Gently pull your heel toward your buttocks. Your knee should not slide out to the side. You should feel a stretch in the front of your thigh and knee (quadriceps). 4. Hold this position for __________ seconds. Repeat __________ times. Complete this exercise __________ times a day. Iliotibial band stretch An iliotibial band is a strong band of muscle tissue that runs from the outer side of your hip to the outer side of your thigh and knee. 1. Lie on your side with your left / right leg in the top position. 2. Bend both of your knees and grab your left / right ankle. Stretch out your bottom arm to help you balance. 3. Slowly bring your top knee back so your thigh goes behind your trunk. 4. Slowly lower your top leg toward the floor until you feel a gentle stretch on the outside of your left / right hip and thigh. If you do not feel a stretch and your knee will not fall farther, place the heel of your other foot on top of your knee and pull your knee down toward the floor with your foot. 5. Hold this position for __________ seconds.  Repeat __________ times. Complete this exercise __________ times a day. Strengthening exercises These exercises build strength and endurance in your hip and pelvis. Endurance is the ability to use your muscles for a long time, even after they get tired. Straight leg raises, side-lying This exercise strengthens the muscles that rotate the leg at the hip and move it away from your body (hip abductors). 1. Lie on your side with your left / right leg in the top position. Lie so your head, shoulder, hip, and knee line up. You may bend your bottom knee to help you balance. 2. Roll your hips slightly forward so your hips are stacked directly over each other and your left / right knee is facing forward. 3. Tense the muscles in your outer thigh and lift your top leg 4-6 inches (10-15 cm). 4. Hold this position for __________ seconds. 5. Slowly return to the starting position. Let your muscles relax completely before doing another repetition. Repeat __________ times. Complete this exercise __________ times a day. Leg raises, prone This exercise strengthens the muscles that move the hips (hip extensors). 1. Lie on your abdomen on your bed or a firm surface. You can put a pillow under your hips if that is more comfortable for your lower back. 2. Bend your left / right knee so your foot is straight up in the air. 3. Squeeze your buttocks muscles and lift your left / right thigh   off the bed. Do not let your back arch. 4. Tense your thigh muscle as hard as you can without increasing any knee pain. 5. Hold this position for __________ seconds. 6. Slowly lower your leg to the starting position and allow it to relax completely. Repeat __________ times. Complete this exercise __________ times a day. Hip hike 1. Stand sideways on a bottom step. Stand on your left / right leg with your other foot unsupported next to the step. You can hold on to the railing or wall for balance if needed. 2. Keep your knees straight  and your torso square. Then lift your left / right hip up toward the ceiling. 3. Slowly let your left / right hip lower toward the floor, past the starting position. Your foot should get closer to the floor. Do not lean or bend your knees. Repeat __________ times. Complete this exercise __________ times a day. This information is not intended to replace advice given to you by your health care provider. Make sure you discuss any questions you have with your health care provider. Document Released: 10/10/2005 Document Revised: 01/31/2019 Document Reviewed: 08/01/2018 Elsevier Patient Education  2020 [employer] Hip Bursitis Rehab Ask your health care provider which exercises are safe for you. Do exercises exactly as told by your health care provider and adjust them as directed. It is normal to feel mild stretching, pulling, tightness, or discomfort as you do these exercises. Stop right away if you feel sudden pain or your pain gets worse. Do not begin these exercises until told by your health care provider. Stretching exercise This exercise warms up your muscles and joints and improves the movement and flexibility of your hip. This exercise also helps to relieve pain and stiffness. Iliotibial band stretch An iliotibial band is a strong band of muscle tissue that runs from the outer side of your hip to the outer side of your thigh and knee. 1. Lie on your side with your left / right leg in the top position. 2. Bend your left / right knee and grab your ankle. Stretch out your bottom arm to help you balance. 3. Slowly bring your knee back so your thigh is behind your body. 4. Slowly lower your knee toward the floor until you feel a gentle stretch on the outside of your left / right thigh. If you do not feel a stretch and your knee will not fall farther, place the heel of your other foot on top of your knee and pull your knee down toward the floor with your foot. 5. Hold this position for __________  seconds. 6. Slowly return to the starting position. Repeat __________ times. Complete this exercise __________ times a day. Strengthening exercises These exercises build strength and endurance in your hip and pelvis. Endurance is the ability to use your muscles for a long time, even after they get tired. Bridge This exercise strengthens the muscles that move your thigh backward (hip extensors). 1. Lie on your back on a firm surface with your knees bent and your feet flat on the floor. 2. Tighten your buttocks muscles and lift your buttocks off the floor until your trunk is level with your thighs. ? Do not arch your back. ? You should feel the muscles working in your buttocks and the back of your thighs. If you do not feel these muscles, slide your feet 1-2 inches (2.5-5 cm) farther away from your buttocks. ? If this exercise is too easy, try doing it with your arms   crossed over your chest. 3. Hold this position for __________ seconds. 4. Slowly lower your hips to the starting position. 5. Let your muscles relax completely after each repetition. Repeat __________ times. Complete this exercise __________ times a day. Squats This exercise strengthens the muscles in front of your thigh and knee (quadriceps). 1. Stand in front of a table, with your feet and knees pointing straight ahead. You may rest your hands on the table for balance but not for support. 2. Slowly bend your knees and lower your hips like you are going to sit in a chair. ? Keep your weight over your heels, not over your toes. ? Keep your lower legs upright so they are parallel with the table legs. ? Do not let your hips go lower than your knees. ? Do not bend lower than told by your health care provider. ? If your hip pain increases, do not bend as low. 3. Hold the squat position for __________ seconds. 4. Slowly push with your legs to return to standing. Do not use your hands to pull yourself to standing. Repeat __________  times. Complete this exercise __________ times a day. Hip hike 1. Stand sideways on a bottom step. Stand on your left / right leg with your other foot unsupported next to the step. You can hold on to the railing or wall for balance if needed. 2. Keep your knees straight and your torso square. Then lift your left / right hip up toward the ceiling. 3. Hold this position for __________ seconds. 4. Slowly let your left / right hip lower toward the floor, past the starting position. Your foot should get closer to the floor. Do not lean or bend your knees. Repeat __________ times. Complete this exercise __________ times a day. Single leg stand 1. Without shoes, stand near a railing or in a doorway. You may hold on to the railing or door frame as needed for balance. 2. Squeeze your left / right buttock muscles, then lift up your other foot. ? Do not let your left / right hip push out to the side. ? It is helpful to stand in front of a mirror for this exercise so you can watch your hip. 3. Hold this position for __________ seconds. Repeat __________ times. Complete this exercise __________ times a day. This information is not intended to replace advice given to you by your health care provider. Make sure you discuss any questions you have with your health care provider. Document Released: 11/17/2004 Document Revised: 02/04/2019 Document Reviewed: 02/04/2019 Elsevier Patient Education  2020 [employer]  

## 2019-09-12 ENCOUNTER — Ambulatory Visit: Payer: BLUE CROSS/BLUE SHIELD | Admitting: Physician Assistant

## 2019-12-04 ENCOUNTER — Other Ambulatory Visit: Payer: Self-pay | Admitting: Cardiology

## 2019-12-06 ENCOUNTER — Other Ambulatory Visit: Payer: Self-pay | Admitting: Rheumatology

## 2019-12-06 NOTE — Telephone Encounter (Signed)
Last Visit: 09/11/2019 Next Visit: 03/11/2020  Okay to refill per Dr. Estanislado Pandy.

## 2019-12-18 DIAGNOSIS — Z23 Encounter for immunization: Secondary | ICD-10-CM | POA: Diagnosis not present

## 2019-12-18 DIAGNOSIS — L03114 Cellulitis of left upper limb: Secondary | ICD-10-CM | POA: Diagnosis not present

## 2019-12-18 DIAGNOSIS — W540XXA Bitten by dog, initial encounter: Secondary | ICD-10-CM | POA: Diagnosis not present

## 2019-12-18 DIAGNOSIS — S61452A Open bite of left hand, initial encounter: Secondary | ICD-10-CM | POA: Diagnosis not present

## 2019-12-31 DIAGNOSIS — M797 Fibromyalgia: Secondary | ICD-10-CM | POA: Diagnosis not present

## 2019-12-31 DIAGNOSIS — I10 Essential (primary) hypertension: Secondary | ICD-10-CM | POA: Diagnosis not present

## 2019-12-31 DIAGNOSIS — M549 Dorsalgia, unspecified: Secondary | ICD-10-CM | POA: Diagnosis not present

## 2019-12-31 DIAGNOSIS — M545 Low back pain: Secondary | ICD-10-CM | POA: Diagnosis not present

## 2020-01-01 ENCOUNTER — Other Ambulatory Visit: Payer: Self-pay | Admitting: Psychiatry

## 2020-02-16 ENCOUNTER — Other Ambulatory Visit: Payer: Self-pay | Admitting: Rheumatology

## 2020-02-17 NOTE — Telephone Encounter (Signed)
Last Visit: 09/11/2019 Next Visit: 03/27/2020  Okay to refill per Dr. Estanislado Pandy.

## 2020-02-24 DIAGNOSIS — H25033 Anterior subcapsular polar age-related cataract, bilateral: Secondary | ICD-10-CM | POA: Diagnosis not present

## 2020-02-24 DIAGNOSIS — H25043 Posterior subcapsular polar age-related cataract, bilateral: Secondary | ICD-10-CM | POA: Diagnosis not present

## 2020-03-05 ENCOUNTER — Other Ambulatory Visit: Payer: Self-pay | Admitting: Psychiatry

## 2020-03-10 ENCOUNTER — Ambulatory Visit: Payer: BC Managed Care – PPO | Admitting: Psychiatry

## 2020-03-11 ENCOUNTER — Ambulatory Visit: Payer: BC Managed Care – PPO | Admitting: Rheumatology

## 2020-03-13 ENCOUNTER — Ambulatory Visit (INDEPENDENT_AMBULATORY_CARE_PROVIDER_SITE_OTHER): Payer: BC Managed Care – PPO | Admitting: Psychiatry

## 2020-03-13 ENCOUNTER — Encounter: Payer: Self-pay | Admitting: Psychiatry

## 2020-03-13 ENCOUNTER — Other Ambulatory Visit: Payer: Self-pay

## 2020-03-13 DIAGNOSIS — G43009 Migraine without aura, not intractable, without status migrainosus: Secondary | ICD-10-CM

## 2020-03-13 DIAGNOSIS — F333 Major depressive disorder, recurrent, severe with psychotic symptoms: Secondary | ICD-10-CM | POA: Diagnosis not present

## 2020-03-13 DIAGNOSIS — F411 Generalized anxiety disorder: Secondary | ICD-10-CM | POA: Diagnosis not present

## 2020-03-13 MED ORDER — GABAPENTIN 100 MG PO CAPS: 100.0000 mg | ORAL_CAPSULE | Freq: Every day | ORAL | 1 refills | Status: DC

## 2020-03-13 MED ORDER — TOPIRAMATE 100 MG PO TABS: 100.0000 mg | ORAL_TABLET | Freq: Every day | ORAL | 1 refills | Status: DC

## 2020-03-13 MED ORDER — RISPERIDONE 3 MG PO TABS: 1.5000 mg | ORAL_TABLET | Freq: Every day | ORAL | 1 refills | Status: DC

## 2020-03-13 NOTE — Progress Notes (Signed)
Office Visit Note  Patient: Brittany Sweeney             Date of Birth: Oct 04, 1961           MRN: [id number]             PCP: Rosita Fire, MD Referring: Rosita Fire, MD Visit Date: 03/27/2020 Occupation: @GUAROCC @  Subjective:  Fatigue   History of Present Illness: Brittany Sweeney is a 59 y.o. female with history of Sjogren syndrome and fibromyalgia.  Patient denies any new or worsening symptoms.  She states that she has had some eye dryness which has been manageable with eyedrops.  She states that the mouth dryness has not been an issue recently.  She denies any recent oral or nasal ulcerations.  She denies any recent rashes.  She denies any symptoms of Raynaud's.  She continues to have generalized myalgias and muscle tenderness due to underlying fibromyalgia.  She has occasional pain in both hands and states that she uses her hands a lot at work.  She denies any joint swelling.  She has not been using voltaren gel recently.  She continues to have chronic fatigue.  She has been sleeping well at night.  Activities of Daily Living:  Patient reports morning stiffness for 30 minutes.   Patient Reports nocturnal pain.  Difficulty dressing/grooming: Denies Difficulty climbing stairs: Denies Difficulty getting out of chair: Denies Difficulty using hands for taps, buttons, cutlery, and/or writing: Denies  Review of Systems  Constitutional: Positive for fatigue.  HENT: Positive for mouth dryness. Negative for mouth sores and nose dryness.   Eyes: Positive for itching and dryness. Negative for pain and visual disturbance.  Respiratory: Negative for cough, hemoptysis, shortness of breath and difficulty breathing.   Cardiovascular: Negative for chest pain, palpitations, hypertension and swelling in legs/feet.  Gastrointestinal: Negative for blood in stool, constipation and diarrhea.  Endocrine: Negative for increased urination.  Genitourinary: Negative for difficulty urinating and painful  urination.  Musculoskeletal: Positive for arthralgias, joint pain, myalgias, morning stiffness, muscle tenderness and myalgias. Negative for joint swelling and muscle weakness.  Skin: Negative for color change, pallor, rash, hair loss, nodules/bumps, redness, skin tightness, ulcers and sensitivity to sunlight.  Allergic/Immunologic: Negative for susceptible to infections.  Neurological: Negative for dizziness, numbness, headaches, memory loss and weakness.  Hematological: Positive for bruising/bleeding tendency. Negative for swollen glands.  Psychiatric/Behavioral: Negative for depressed mood, confusion and sleep disturbance. The patient is not nervous/anxious.     PMFS History:  Patient Active Problem List   Diagnosis Date Noted  . Sjogren's syndrome ([place]) 09/22/2016  . Fibromyalgia 09/22/2016  . Chronic pain syndrome 09/22/2016  . Osteoarthritis of both hands 09/22/2016  . Migraine 09/22/2016  . Schizophrenia, paranoid type ([place]) 04/25/2012  . DEPRESSION 10/21/2010  . OVERWEIGHT/OBESITY 12/18/2009  . Hypertension 12/18/2009  . 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 Cardiomyopathy   . Depression   . Fibromyalgia   . Hypertension     Family History  Problem Relation Age of Onset  . Hypertension Mother    Past Surgical History:  Procedure Laterality Date  . ABDOMINAL HYSTERECTOMY     Social History   Social History Narrative  . Not on file    There is no immunization history on file for this patient.   Objective: Vital Signs: BP 116/64 (BP Location: Left Arm, Patient Position: Sitting, Cuff Size: Normal)   Pulse 75   Resp 16  Ht 5\' 5"  (1.651 m)   Wt 229 lb (103.9 kg)   BMI 38.11 kg/m    Physical Exam Vitals and nursing note reviewed.  Constitutional:      Appearance: She is well-developed.  HENT:     Head: Normocephalic and atraumatic.  Eyes:     Conjunctiva/sclera: Conjunctivae normal.  Pulmonary:      Effort: Pulmonary effort is normal.  Abdominal:     General: Bowel sounds are normal.     Palpations: Abdomen is soft.  Musculoskeletal:     Cervical back: Normal range of motion.  Lymphadenopathy:     Cervical: No cervical adenopathy.  Skin:    General: Skin is warm and dry.     Capillary Refill: Capillary refill takes less than 2 seconds.  Neurological:     Mental Status: She is alert and oriented to person, place, and time.  Psychiatric:        Behavior: Behavior normal.      Musculoskeletal Exam: C-spine, thoracic spine, lumbar spine good range of motion.  Shoulder joints, elbow joints, wrist joint, MCPs, PIPs, DIPs good range of motion with no synovitis.  Very mild osteoarthritic changes noted in both hands.  She has complete fist formation bilaterally.  Hip joints have good range of motion with no discomfort.  No tenderness over trochanteric bursa bilaterally.  Knee joints have good range of motion with no warmth or effusion.  Ankle joints have good range of motion no tenderness or inflammation.  No tenderness of MTP joints.  CDAI Exam: CDAI Score: -- Patient Global: --; Provider Global: -- Swollen: --; Tender: -- Joint Exam 03/27/2020   No joint exam has been documented for this visit   There is currently no information documented on the homunculus. Go to the Rheumatology activity and complete the homunculus joint exam.  Investigation: No additional findings.  Imaging: No results found.  Recent Labs: Lab Results  Component Value Date   WBC 5.4 04/23/2012   HGB 14.2 04/23/2012   PLT 229 04/23/2012   NA 140 04/30/2012   K 3.9 04/30/2012   CL 105 04/30/2012   CO2 27 04/30/2012   GLUCOSE 90 04/30/2012   BUN 15 04/30/2012   CREATININE 0.79 04/30/2012   BILITOT 0.1 (L) 04/27/2012   ALKPHOS 70 04/27/2012   AST 13 04/27/2012   ALT 14 04/27/2012   PROT 7.0 04/27/2012   ALBUMIN 3.6 04/27/2012   CALCIUM 9.4 04/30/2012   GFRAA >90 04/30/2012    Speciality  Comments: No specialty comments available.  Procedures:  No procedures performed Allergies: Penicillins and Sulfonamide derivatives   Assessment / Plan:     Visit Diagnoses: Sjogren's syndrome, with unspecified organ involvement ([place]) - Positive ANA, positive Ro: Her sicca symptoms have been tolerable.  She uses eyedrops for symptomatic relief.  She has not had any mouth dryness recently.  She denies any new or worsening symptoms.  She has no signs of inflammatory arthritis.  She has no other clinical features of autoimmune disease at this time.  She declined updated lab work today.  She was advised to notify us if she develops any new or worsening symptoms.  To follow-up in the office in 6 months.  Fibromyalgia: She has generalized hyperalgesia and positive tender points on exam.  She continues to have generalized myalgias and muscle tenderness due to underlying fibromyalgia.  Her level of fatigue has been stable and she has been sleeping better at night.  We discussed the importance of regular exercise  and good sleep hygiene.  Chronic pain syndrome: Overall her discomfort has been tolerable.  Primary osteoarthritis of both hands: She has mild PIP and DIP thickening consistent with osteoarthritis of both hands.  No tenderness or inflammation was noted.  She has complete fist formation bilaterally.  She uses her hands a lot at work which causes some increased discomfort.  Joint protection and muscle strengthening were discussed.  Trochanteric bursitis of both hips: She experiences intermittent discomfort due to trochanter bursitis bilaterally.  She has no tenderness to palpation on exam today.  She was encouraged to continue to perform stretching exercises on a daily basis.  Other medical conditions are listed as follows:  Secondary cardiomyopathy   Essential hypertension  Migraines  Orders: No orders of the defined types were placed in this encounter.  No orders of the defined types were  placed in this encounter.    Follow-Up Instructions: Return in about 6 months (around 09/26/2020) for Sjogren's syndrome, Fibromyalgia.  Hazel Sams, PA-C  I examined and evaluated the patient with Hazel Sams PA.  Patient continues to have some sicca symptoms.  She had no synovitis on examination.  She also has some osteoarthritis.  The plan of care was discussed as noted above.  Bo Merino, MD   Note - This record has been created using Editor, commissioning.  Chart creation errors have been sought, but may not always  have been located. Such creation errors do not reflect on  the standard of medical care.

## 2020-03-13 NOTE — Progress Notes (Signed)
CHARLIEROSE Sweeney [id number] [birth year] 58 y.o.   Subjective:   [name] is a 59 y.o. (DOB 06/28/1961) female.  Chief Complaint:  Chief Complaint  Patient presents with  . Follow-up    HPI ELLIZABETH Sweeney presents  today for follow-up of depression and anxiety.   Last seen November 2020.  No meds were changed.  She continued on Wellbutrin XL 300 mg in the morning, gabapentin 100 nightly, risperidone 3 mg 1/2 tablet nightly, Topamax 100 mg tablets 1-1/2 tablets daily.  Doing fine.  Ready to retire and hopes for May 59.  Been working first and second shift and it's been working out well with it.  Sleep is good and better with 2nd shift.   HA managed with topiramate given originally by neurology. Undecided about vaccine but thinking about it.  Patient reports stable mood and denies depressed or irritable moods. A little restless.  Patient denies any recent difficulty with anxiety.  Patient denies difficulty usually with sleep initiation or maintenance. 8-9 hours.  Denies appetite disturbance.  Patient reports that energy and motivation have been good.  Patient denies any difficulty with concentration.  Patient denies any suicidal ideation.  No fear.  No voices.  Plans to retire at 59 yo.  Past Psychiatric Medication Trials: Sertraline cognitive side effects, n Wellbutrin 300, risperidone 1.5, gabapentin History of psychiatric hospitalization with depression, suicidal thoughts, and paranoia..  Long history of recurrent depression and paranoia  Review of Systems:  Review of Systems  Skin:       Skin crawling still some but less and all over but mostly trunk.  Neurological: Negative for tremors and weakness.  Psychiatric/Behavioral: Negative for agitation, behavioral problems, confusion, decreased concentration, dysphoric mood, hallucinations, self-injury, sleep disturbance and suicidal ideas. The patient is not nervous/anxious and is not hyperactive.      Medications: I have reviewed the patient's current medications.  Current Outpatient Medications  Medication Sig Dispense Refill  . buPROPion (WELLBUTRIN XL) 300 MG 24 hr tablet TAKE 1 TABLET BY MOUTH EVERY DAY 90 tablet 3  . carvedilol (COREG) 6.25 MG tablet TAKE 1 TABLET (6.25 MG TOTAL) BY MOUTH 2 (TWO) TIMES DAILY WITH A MEAL. 180 tablet 3  . diclofenac sodium (VOLTAREN) 1 % GEL Apply 2 grams to 4 grams to affected area up to 4 times daily as needed 4 Tube 2  . nystatin-triamcinolone ointment (MYCOLOG) Apply 1 application topically 2 (two) times daily. (Patient taking differently: Apply 1 application topically as needed. ) 30 g 11  . gabapentin (NEURONTIN) 100 MG capsule Take 1 capsule (100 mg total) by mouth at bedtime. 90 capsule 1  . risperiDONE (RISPERDAL) 3 MG tablet Take 0.5 tablets (1.5 mg total) by mouth daily. 45 tablet 1  . topiramate (TOPAMAX) 100 MG tablet Take 1 tablet (100 mg total) by mouth daily. 90 tablet 1   No current facility-administered medications for this visit.    Medication Side Effects: None  Allergies:  Allergies  Allergen Reactions  . Sulfonamide Derivatives Hives    Past Medical History:  Diagnosis Date  . Cardiomyopathy   . Depression   . Fibromyalgia   . Hypertension     Family History  Problem Relation Age of Onset  . Hypertension Mother     Social History   Socioeconomic History  . Marital status: Married    Spouse name: Not on file  . Number of children: Not on file  . Years of education: Not on file  .  Highest education level: Not on file  Occupational History  . Not on file  Tobacco Use  . Smoking status: Never Smoker  . Smokeless tobacco: Never Used  Substance and Sexual Activity  . Alcohol use: No    Alcohol/week: 0.0 standard drinks  . Drug use: Never  . Sexual activity: Yes    Birth control/protection: Surgical  Other Topics Concern  . Not on file  Social History Narrative  . Not on file   Social Determinants  of Health   Financial Resource Strain:   . Difficulty of Paying Living Expenses:   Food Insecurity:   . Worried About Charity fundraiser in the Last Year:   . Arboriculturist in the Last Year:   Transportation Needs:   . Film/video editor (Medical):   [name] Lack of Transportation (Non-Medical):   Physical Activity:   . Days of Exercise per Week:   . Minutes of Exercise per Session:   Stress:   . Feeling of Stress :   Social Connections:   . Frequency of Communication with Friends and Family:   . Frequency of Social Gatherings with Friends and Family:   . Attends Religious Services:   . Active Member of Clubs or Organizations:   . Attends Archivist Meetings:   [name] Marital Status:   Intimate Partner Violence:   . Fear of Current or Ex-Partner:   . Emotionally Abused:   [name] Physically Abused:   . Sexually Abused:     Past Medical History, Surgical history, Social history, and Family history were reviewed and updated as appropriate.   Please see review of systems for further details on the patient's review from today.   Objective:   Physical Exam:  There were no vitals taken for this visit.  Physical Exam Neurological:     Mental Status: She is alert and oriented to person, place, and time.     Cranial Nerves: No dysarthria.  Psychiatric:        Attention and Perception: Attention normal. She does not perceive auditory hallucinations.        Mood and Affect: Mood normal. Mood is not anxious or depressed.        Speech: Speech normal.        Behavior: Behavior is cooperative.        Thought Content: Thought content normal. Thought content is not paranoid or delusional. Thought content does not include homicidal or suicidal ideation. Thought content does not include homicidal or suicidal plan.        Cognition and Memory: Cognition and memory normal.        Judgment: Judgment normal.     Comments: No concerns about meds but eventually wants to stop when  retires. Fair insight.     Lab Review:     Component Value Date/Time   NA 140 04/30/2012 0613   K 3.9 04/30/2012 0613   CL 105 04/30/2012 0613   CO2 27 04/30/2012 0613   GLUCOSE 90 04/30/2012 0613   BUN 15 04/30/2012 0613   CREATININE 0.79 04/30/2012 0613   CALCIUM 9.4 04/30/2012 0613   PROT 7.0 04/27/2012 1930   ALBUMIN 3.6 04/27/2012 1930   AST 13 04/27/2012 1930   ALT 14 04/27/2012 1930   ALKPHOS 70 04/27/2012 1930   BILITOT 0.1 (L) 04/27/2012 1930   GFRNONAA >90 04/30/2012 0613   GFRAA >90 04/30/2012 0613       Component Value Date/Time   WBC 5.4 04/23/2012  2131   RBC 4.54 04/23/2012 2131   HGB 14.2 04/23/2012 2131   HCT 40.0 04/23/2012 2131   PLT 229 04/23/2012 2131   MCV 88.1 04/23/2012 2131   MCH 31.3 04/23/2012 2131   MCHC 35.5 04/23/2012 2131   RDW 12.4 04/23/2012 2131   LYMPHSABS 2.1 04/23/2012 2131   MONOABS 0.4 04/23/2012 2131   EOSABS 0.1 04/23/2012 2131   BASOSABS 0.0 04/23/2012 2131    No results found for: POCLITH, LITHIUM   No results found for: PHENYTOIN, PHENOBARB, VALPROATE, CBMZ   .res Assessment: Plan:    Severe recurrent major depression with psychotic features ([place]) - Plan: risperiDONE (RISPERDAL) 3 MG tablet  Generalized anxiety disorder - Plan: risperiDONE (RISPERDAL) 3 MG tablet, gabapentin (NEURONTIN) 100 MG capsule  Migraine without aura and without status migrainosus, not intractable - Plan: topiramate (TOPAMAX) 100 MG tablet   SX in remission with the meds and she has no med complaints.  She's pleased and satisfied.  History of relapse with attempts to decrease the Risperidone.  Therefore need to continue the current dosage.  She wants to consider coming off some of the medication but it would be risky to do so.  She agrees to defer any medicine reduction until after retirement.  Discussed potential metabolic side effects associated with atypical antipsychotics, as well as potential risk for movement side effects. Advised pt to  contact office if movement side effects occur.  Labs per pcp.  No problems with the wellbutrin xl 300.  Gabapentin helps for pain.   HA ok with reduced topiramate to 100 mg .  No med changes today  FU 6 mos  Lynder Parents, MD, DFAPA   Please see After Visit Summary for patient specific instructions.  Future Appointments  Date Time Provider [place]  03/27/2020 10:15 AM Bo Merino, MD [facility] None    No orders of the defined types were placed in this encounter.     -------------------------------

## 2020-03-17 DIAGNOSIS — Z23 Encounter for immunization: Secondary | ICD-10-CM | POA: Diagnosis not present

## 2020-03-27 ENCOUNTER — Encounter: Payer: Self-pay | Admitting: Rheumatology

## 2020-03-27 ENCOUNTER — Other Ambulatory Visit: Payer: Self-pay

## 2020-03-27 ENCOUNTER — Ambulatory Visit: Payer: BC Managed Care – PPO | Admitting: Rheumatology

## 2020-03-27 VITALS — BP 116/64 | HR 75 | Resp 16 | Ht 65.0 in | Wt 229.0 lb

## 2020-03-27 DIAGNOSIS — M3500 Sicca syndrome, unspecified: Secondary | ICD-10-CM | POA: Diagnosis not present

## 2020-03-27 DIAGNOSIS — M797 Fibromyalgia: Secondary | ICD-10-CM

## 2020-03-27 DIAGNOSIS — I429 Cardiomyopathy, unspecified: Secondary | ICD-10-CM

## 2020-03-27 DIAGNOSIS — M19041 Primary osteoarthritis, right hand: Secondary | ICD-10-CM | POA: Diagnosis not present

## 2020-03-27 DIAGNOSIS — M19042 Primary osteoarthritis, left hand: Secondary | ICD-10-CM

## 2020-03-27 DIAGNOSIS — G43809 Other migraine, not intractable, without status migrainosus: Secondary | ICD-10-CM

## 2020-03-27 DIAGNOSIS — G894 Chronic pain syndrome: Secondary | ICD-10-CM

## 2020-03-27 DIAGNOSIS — M7062 Trochanteric bursitis, left hip: Secondary | ICD-10-CM

## 2020-03-27 DIAGNOSIS — M7061 Trochanteric bursitis, right hip: Secondary | ICD-10-CM

## 2020-03-27 DIAGNOSIS — I10 Essential (primary) hypertension: Secondary | ICD-10-CM

## 2020-04-09 DIAGNOSIS — Q120 Congenital cataract: Secondary | ICD-10-CM | POA: Diagnosis not present

## 2020-04-09 DIAGNOSIS — H268 Other specified cataract: Secondary | ICD-10-CM | POA: Diagnosis not present

## 2020-05-14 ENCOUNTER — Other Ambulatory Visit: Payer: Self-pay | Admitting: Rheumatology

## 2020-05-14 DIAGNOSIS — G43009 Migraine without aura, not intractable, without status migrainosus: Secondary | ICD-10-CM

## 2020-05-14 NOTE — Telephone Encounter (Signed)
Last Visit: 03/27/2020 Next Visit: 09/29/2020  Okay to refill per Dr. Estanislado Pandy

## 2020-05-17 DIAGNOSIS — H2511 Age-related nuclear cataract, right eye: Secondary | ICD-10-CM | POA: Diagnosis not present

## 2020-05-21 DIAGNOSIS — Q120 Congenital cataract: Secondary | ICD-10-CM | POA: Diagnosis not present

## 2020-05-21 DIAGNOSIS — H2511 Age-related nuclear cataract, right eye: Secondary | ICD-10-CM | POA: Diagnosis not present

## 2020-06-09 DIAGNOSIS — Z6838 Body mass index (BMI) 38.0-38.9, adult: Secondary | ICD-10-CM | POA: Diagnosis not present

## 2020-06-09 DIAGNOSIS — R21 Rash and other nonspecific skin eruption: Secondary | ICD-10-CM | POA: Diagnosis not present

## 2020-06-09 DIAGNOSIS — I10 Essential (primary) hypertension: Secondary | ICD-10-CM | POA: Diagnosis not present

## 2020-06-09 DIAGNOSIS — M797 Fibromyalgia: Secondary | ICD-10-CM | POA: Diagnosis not present

## 2020-06-26 ENCOUNTER — Telehealth: Payer: Self-pay | Admitting: Obstetrics and Gynecology

## 2020-06-26 NOTE — Telephone Encounter (Signed)
Patient called stating that she needs a refill of her medication that helps her with the heat rash. Patient states that Dr. Glo Herring is the one the prescribed this medication for her. Please contact pt

## 2020-06-26 NOTE — Telephone Encounter (Signed)
Mycolog was prescribed by Dr. Elonda Husky.

## 2020-06-30 ENCOUNTER — Telehealth: Payer: Self-pay | Admitting: Obstetrics & Gynecology

## 2020-06-30 MED ORDER — NYSTATIN-TRIAMCINOLONE 100000-0.1 UNIT/GM-% EX OINT: 1.0000 "application " | TOPICAL_OINTMENT | Freq: Two times a day (BID) | CUTANEOUS | 11 refills | Status: DC

## 2020-06-30 NOTE — Telephone Encounter (Signed)
Left message letting pt know a cream has been sent to pharmacy. [place]

## 2020-07-11 DIAGNOSIS — Z23 Encounter for immunization: Secondary | ICD-10-CM | POA: Diagnosis not present

## 2020-07-11 DIAGNOSIS — I10 Essential (primary) hypertension: Secondary | ICD-10-CM | POA: Diagnosis not present

## 2020-07-11 DIAGNOSIS — R21 Rash and other nonspecific skin eruption: Secondary | ICD-10-CM | POA: Diagnosis not present

## 2020-07-11 DIAGNOSIS — M797 Fibromyalgia: Secondary | ICD-10-CM | POA: Diagnosis not present

## 2020-07-11 DIAGNOSIS — Z6836 Body mass index (BMI) 36.0-36.9, adult: Secondary | ICD-10-CM | POA: Diagnosis not present

## 2020-08-14 DIAGNOSIS — M25572 Pain in left ankle and joints of left foot: Secondary | ICD-10-CM | POA: Diagnosis not present

## 2020-08-14 DIAGNOSIS — S96912A Strain of unspecified muscle and tendon at ankle and foot level, left foot, initial encounter: Secondary | ICD-10-CM | POA: Diagnosis not present

## 2020-08-14 DIAGNOSIS — M79672 Pain in left foot: Secondary | ICD-10-CM | POA: Diagnosis not present

## 2020-08-24 ENCOUNTER — Other Ambulatory Visit: Payer: Self-pay | Admitting: Rheumatology

## 2020-08-24 ENCOUNTER — Other Ambulatory Visit: Payer: Self-pay | Admitting: Psychiatry

## 2020-08-24 DIAGNOSIS — G43009 Migraine without aura, not intractable, without status migrainosus: Secondary | ICD-10-CM

## 2020-08-24 NOTE — Telephone Encounter (Signed)
Patient is in agreement to reduce the dose of Topamax 100 mg p.o. daily.

## 2020-08-24 NOTE — Telephone Encounter (Signed)
Please asked patient if she can reduce the dose of Topamax 100 mg p.o. daily.

## 2020-08-24 NOTE — Telephone Encounter (Signed)
Last Visit: 03/27/2020 Next Visit: 09/29/2020  Last fill: 05/14/2020  Okay to refill topamax?

## 2020-08-31 DIAGNOSIS — Z1231 Encounter for screening mammogram for malignant neoplasm of breast: Secondary | ICD-10-CM | POA: Diagnosis not present

## 2020-09-14 ENCOUNTER — Encounter: Payer: Self-pay | Admitting: Psychiatry

## 2020-09-14 ENCOUNTER — Other Ambulatory Visit: Payer: Self-pay

## 2020-09-14 ENCOUNTER — Ambulatory Visit (INDEPENDENT_AMBULATORY_CARE_PROVIDER_SITE_OTHER): Payer: BC Managed Care – PPO | Admitting: Psychiatry

## 2020-09-14 DIAGNOSIS — G43009 Migraine without aura, not intractable, without status migrainosus: Secondary | ICD-10-CM | POA: Diagnosis not present

## 2020-09-14 DIAGNOSIS — F411 Generalized anxiety disorder: Secondary | ICD-10-CM

## 2020-09-14 DIAGNOSIS — F333 Major depressive disorder, recurrent, severe with psychotic symptoms: Secondary | ICD-10-CM

## 2020-09-14 MED ORDER — GABAPENTIN 100 MG PO CAPS: 100.0000 mg | ORAL_CAPSULE | Freq: Every day | ORAL | 1 refills | Status: DC

## 2020-09-14 MED ORDER — RISPERIDONE 3 MG PO TABS: 1.5000 mg | ORAL_TABLET | Freq: Every day | ORAL | 1 refills | Status: DC

## 2020-09-14 MED ORDER — BUPROPION HCL ER (XL) 300 MG PO TB24: 300.0000 mg | ORAL_TABLET | Freq: Every day | ORAL | 1 refills | Status: DC

## 2020-09-14 NOTE — Progress Notes (Signed)
Brittany Sweeney [id number] [birth year] 59 y.o.   Subjective:   [name] is a 59 y.o. (DOB 12-Mar-1961) female.  Chief Complaint:  Chief Complaint  Patient presents with  . Follow-up    HPI Brittany Sweeney presents  today for follow-up of depression and anxiety.   seen November 2020 & 02/2020.  No meds were changed.  She continued on Wellbutrin XL 300 mg in the morning, gabapentin 100 nightly, risperidone 3 mg 1/2 tablet nightly, Topamax 100 mg tablets 1-1/2 tablets daily.  09/14/20 appt with following noted: Dx. Sjogren's syndrome. Tired from working 3rd shift and otherwise Doing fine.  Ready to retire and hopes for May 2022.  If can get insurance.  Sleep is good and better with 2nd shift.   HA managed with topiramate given originally by neurology. Vaccinated.  Patient reports stable mood and denies depressed or irritable moods. A little restless.  Patient denies any recent difficulty with anxiety.  Patient denies difficulty usually with sleep initiation or maintenance. 8-9 hours.  Denies appetite disturbance.  Patient reports that energy and motivation have been good.  Patient denies any difficulty with concentration.  Patient denies any suicidal ideation.  No fear.  No voices.  Plans to retire at 60 yo.  Past Psychiatric Medication Trials: Sertraline cognitive side effects,  Wellbutrin 300,  risperidone 1.5,  gabapentin History of psychiatric hospitalization with depression, suicidal thoughts, and paranoia..  Long history of recurrent depression and paranoia  Review of Systems:  Review of Systems  Constitutional: Positive for fatigue.  Skin:       Skin crawling still some but less and all over but mostly trunk.  Neurological: Negative for tremors and weakness.  Psychiatric/Behavioral: Negative for agitation, behavioral problems, confusion, decreased concentration, dysphoric mood, hallucinations, self-injury, sleep disturbance and suicidal ideas. The patient  is not nervous/anxious and is not hyperactive.     Medications: I have reviewed the patient's current medications.  Current Outpatient Medications  Medication Sig Dispense Refill  . buPROPion (WELLBUTRIN XL) 300 MG 24 hr tablet Take 1 tablet (300 mg total) by mouth daily. 90 tablet 1  . carvedilol (COREG) 6.25 MG tablet TAKE 1 TABLET (6.25 MG TOTAL) BY MOUTH 2 (TWO) TIMES DAILY WITH A MEAL. 180 tablet 3  . diclofenac sodium (VOLTAREN) 1 % GEL Apply 2 grams to 4 grams to affected area up to 4 times daily as needed 4 Tube 2  . gabapentin (NEURONTIN) 100 MG capsule Take 1 capsule (100 mg total) by mouth at bedtime. 90 capsule 1  . nystatin-triamcinolone ointment (MYCOLOG) Apply 1 application topically 2 (two) times daily. 30 g 11  . risperiDONE (RISPERDAL) 3 MG tablet Take 0.5 tablets (1.5 mg total) by mouth daily. 45 tablet 1  . topiramate (TOPAMAX) 100 MG tablet Take 1 tablet (100 mg total) by mouth daily. 90 tablet 0   No current facility-administered medications for this visit.    Medication Side Effects: None  Allergies:  Allergies  Allergen Reactions  . Penicillins Rash  . Sulfonamide Derivatives Hives    Past Medical History:  Diagnosis Date  . Cardiomyopathy   . Depression   . Fibromyalgia   . Hypertension     Family History  Problem Relation Age of Onset  . Hypertension Mother     Social History   Socioeconomic History  . Marital status: Married    Spouse name: Not on file  . Number of children: Not on file  . Years of education:  Not on file  . Highest education level: Not on file  Occupational History  . Not on file  Tobacco Use  . Smoking status: Never Smoker  . Smokeless tobacco: Never Used  Vaping Use  . Vaping Use: Never used  Substance and Sexual Activity  . Alcohol use: No    Alcohol/week: 0.0 standard drinks  . Drug use: Never  . Sexual activity: Yes    Birth control/protection: Surgical  Other Topics Concern  . Not on file  Social History  Narrative  . Not on file   Social Determinants of Health   Financial Resource Strain:   . Difficulty of Paying Living Expenses: Not on file  Food Insecurity:   . Worried About Charity fundraiser in the Last Year: Not on file  . Ran Out of Food in the Last Year: Not on file  Transportation Needs:   . Lack of Transportation (Medical): Not on file  . Lack of Transportation (Non-Medical): Not on file  Physical Activity:   . Days of Exercise per Week: Not on file  . Minutes of Exercise per Session: Not on file  Stress:   . Feeling of Stress : Not on file  Social Connections:   . Frequency of Communication with Friends and Family: Not on file  . Frequency of Social Gatherings with Friends and Family: Not on file  . Attends Religious Services: Not on file  . Active Member of Clubs or Organizations: Not on file  . Attends Archivist Meetings: Not on file  . Marital Status: Not on file  Intimate Partner Violence:   . Fear of Current or Ex-Partner: Not on file  . Emotionally Abused: Not on file  . Physically Abused: Not on file  . Sexually Abused: Not on file    Past Medical History, Surgical history, Social history, and Family history were reviewed and updated as appropriate.   Please see review of systems for further details on the patient's review from today.   Objective:   Physical Exam:  There were no vitals taken for this visit.  Physical Exam Constitutional:      General: She is not in acute distress. Musculoskeletal:        General: No deformity.  Neurological:     Mental Status: She is alert and oriented to person, place, and time.     Cranial Nerves: No dysarthria.     Coordination: Coordination normal.  Psychiatric:        Attention and Perception: Attention and perception normal. She does not perceive auditory or visual hallucinations.        Mood and Affect: Mood normal. Mood is not anxious or depressed. Affect is not labile, blunt, angry or  inappropriate.        Speech: Speech normal.        Behavior: Behavior normal. Behavior is cooperative.        Thought Content: Thought content normal. Thought content is not paranoid or delusional. Thought content does not include homicidal or suicidal ideation. Thought content does not include homicidal or suicidal plan.        Cognition and Memory: Cognition and memory normal.        Judgment: Judgment normal.     Comments: No concerns about meds but eventually wants to stop when retires. Fair insight.     Lab Review:     Component Value Date/Time   NA 140 04/30/2012 0613   K 3.9 04/30/2012 0613   CL  105 04/30/2012 0613   CO2 27 04/30/2012 0613   GLUCOSE 90 04/30/2012 0613   BUN 15 04/30/2012 0613   CREATININE 0.79 04/30/2012 0613   CALCIUM 9.4 04/30/2012 0613   PROT 7.0 04/27/2012 1930   ALBUMIN 3.6 04/27/2012 1930   AST 13 04/27/2012 1930   ALT 14 04/27/2012 1930   ALKPHOS 70 04/27/2012 1930   BILITOT 0.1 (L) 04/27/2012 1930   GFRNONAA >90 04/30/2012 0613   GFRAA >90 04/30/2012 0613       Component Value Date/Time   WBC 5.4 04/23/2012 2131   RBC 4.54 04/23/2012 2131   HGB 14.2 04/23/2012 2131   HCT 40.0 04/23/2012 2131   PLT 229 04/23/2012 2131   MCV 88.1 04/23/2012 2131   MCH 31.3 04/23/2012 2131   MCHC 35.5 04/23/2012 2131   RDW 12.4 04/23/2012 2131   LYMPHSABS 2.1 04/23/2012 2131   MONOABS 0.4 04/23/2012 2131   EOSABS 0.1 04/23/2012 2131   BASOSABS 0.0 04/23/2012 2131    No results found for: POCLITH, LITHIUM   No results found for: PHENYTOIN, PHENOBARB, VALPROATE, CBMZ   .res Assessment: Plan:    Severe recurrent major depression with psychotic features ([place]) - Plan: buPROPion (WELLBUTRIN XL) 300 MG 24 hr tablet, risperiDONE (RISPERDAL) 3 MG tablet  Generalized anxiety disorder - Plan: gabapentin (NEURONTIN) 100 MG capsule, risperiDONE (RISPERDAL) 3 MG tablet  Migraine without aura and without status migrainosus, not intractable - Plan: gabapentin  (NEURONTIN) 100 MG capsule   SX in remission with the meds and she has no med complaints.  She's pleased and satisfied.  History of relapse with attempts to decrease the Risperidone.  Therefore need to continue the current dosage.  She wants to consider coming off some of the medication but it would be risky to do so.  She agrees to defer any medicine reduction until after retirement.  Disc diagnosis per her request.  PCP asked.  Disc risk without meds.  Discussed potential metabolic side effects associated with atypical antipsychotics, as well as potential risk for movement side effects. Advised pt to contact office if movement side effects occur.  Labs per pcp.  Looked good today including glucose.  No problems with the wellbutrin xl 300.  Gabapentin helps for pain.   HA ok with reduced topiramate to 100 mg .  No med changes today  FU 6 mos  Lynder Parents, MD, DFAPA   Please see After Visit Summary for patient specific instructions.  Future Appointments  Date Time Provider [place]  09/29/2020  3:15 PM Bo Merino, MD [facility] None  10/26/2020  3:00 PM Shanon Ace, LCSW [facility] None    No orders of the defined types were placed in this encounter.     -------------------------------

## 2020-09-15 NOTE — Progress Notes (Signed)
Office Visit Note  Patient: Brittany Sweeney             Date of Birth: Mar 21, 1961           MRN: [id number]             PCP: [place] Nation, MD Referring: Rosita Fire, MD Visit Date: 09/29/2020 Occupation: @GUAROCC @  Subjective:  Medication management.   History of Present Illness: Brittany Sweeney is a 59 y.o. female with history of Sjogren's, osteoarthritis and fibromyalgia syndrome.  She continues to have some generalized aches and pain from fibromyalgia and chronic pain syndrome.  She states she has dry eyes which are manageable with over-the-counter medications.  She continues to have some discomfort in the trochanteric bursa area.  None of the other joints are painful.  She continues to takes Topamax at bedtime.  She believes it helps her with her migraines.  Activities of Daily Living:  Patient reports morning stiffness for 10-15 minutes.   Patient Reports nocturnal pain.  Difficulty dressing/grooming: Denies Difficulty climbing stairs: Denies Difficulty getting out of chair: Denies Difficulty using hands for taps, buttons, cutlery, and/or writing: Denies  Review of Systems  Constitutional: Positive for fatigue.  HENT: Positive for mouth dryness and nose dryness. Negative for mouth sores.   Eyes: Positive for itching and dryness. Negative for pain and visual disturbance.  Respiratory: Negative for cough, hemoptysis, shortness of breath and difficulty breathing.   Cardiovascular: Negative for chest pain, palpitations and swelling in legs/feet.  Gastrointestinal: Negative for abdominal pain, blood in stool, constipation and diarrhea.  Endocrine: Negative for increased urination.  Genitourinary: Negative for painful urination.  Musculoskeletal: Positive for arthralgias, joint pain, joint swelling, myalgias, morning stiffness, muscle tenderness and myalgias. Negative for muscle weakness.  Skin: Positive for rash. Negative for color change and redness.    Allergic/Immunologic: Negative for susceptible to infections.  Neurological: Positive for weakness. Negative for dizziness, numbness, headaches and memory loss.  Hematological: Negative for swollen glands.  Psychiatric/Behavioral: Negative for confusion and sleep disturbance.    PMFS History:  Patient Active Problem List   Diagnosis Date Noted  . Sjogren's syndrome ([place]) 09/22/2016  . Fibromyalgia 09/22/2016  . Chronic pain syndrome 09/22/2016  . Osteoarthritis of both hands 09/22/2016  . Migraine 09/22/2016  . Schizophrenia, paranoid type ([place]) 04/25/2012  . DEPRESSION 10/21/2010  . OVERWEIGHT/OBESITY 12/18/2009  . Hypertension 12/18/2009  . Secondary cardiomyopathy ([place]) 12/18/2009    Past Medical History:  Diagnosis Date  . Cardiomyopathy   . Depression   . Fibromyalgia   . Hypertension     Family History  Problem Relation Age of Onset  . Hypertension Mother    Past Surgical History:  Procedure Laterality Date  . ABDOMINAL HYSTERECTOMY     Social History   Social History Narrative  . Not on file   Immunization History  Administered Date(s) Administered  . Janssen (J&J) SARS-COV-2 Vaccination 03/14/2020     Objective: Vital Signs: BP 128/73 (BP Location: Right Arm, Patient Position: Sitting, Cuff Size: Small)   Pulse 69   Ht 5\' 5"  (1.651 m)   Wt 227 lb (103 kg)   BMI 37.77 kg/m    Physical Exam Vitals and nursing note reviewed.  Constitutional:      Appearance: She is well-developed.  HENT:     Head: Normocephalic and atraumatic.  Eyes:     Conjunctiva/sclera: Conjunctivae normal.  Cardiovascular:     Rate and Rhythm: Normal rate and regular rhythm.  Heart sounds: Normal heart sounds.  Pulmonary:     Effort: Pulmonary effort is normal.     Breath sounds: Normal breath sounds.  Abdominal:     General: Bowel sounds are normal.     Palpations: Abdomen is soft.  Musculoskeletal:     Cervical back: Normal range of motion.  Lymphadenopathy:      Cervical: No cervical adenopathy.  Skin:    General: Skin is warm and dry.     Capillary Refill: Capillary refill takes less than 2 seconds.  Neurological:     Mental Status: She is alert and oriented to person, place, and time.  Psychiatric:        Behavior: Behavior normal.      Musculoskeletal Exam: C-spine was in good range of motion.  Shoulder joints, elbow joints, wrist joints with good range of motion.  She has bilateral PIP and DIP thickening without synovitis.  Hip joints, knee joints, ankles, MTPs and PIPs with good range of motion.  She is some tenderness over bilateral trochanteric area.  CDAI Exam: CDAI Score: -- Patient Global: --; Provider Global: -- Swollen: --; Tender: -- Joint Exam 09/29/2020   No joint exam has been documented for this visit   There is currently no information documented on the homunculus. Go to the Rheumatology activity and complete the homunculus joint exam.  Investigation: No additional findings.  Imaging: No results found.  Recent Labs: Lab Results  Component Value Date   WBC 5.4 04/23/2012   HGB 14.2 04/23/2012   PLT 229 04/23/2012   NA 140 04/30/2012   K 3.9 04/30/2012   CL 105 04/30/2012   CO2 27 04/30/2012   GLUCOSE 90 04/30/2012   BUN 15 04/30/2012   CREATININE 0.79 04/30/2012   BILITOT 0.1 (L) 04/27/2012   ALKPHOS 70 04/27/2012   AST 13 04/27/2012   ALT 14 04/27/2012   PROT 7.0 04/27/2012   ALBUMIN 3.6 04/27/2012   CALCIUM 9.4 04/30/2012   GFRAA >90 04/30/2012   06/13/2020 labs from Dr. Jimmye Norman office showed CBC: WBC 4.7, hemoglobin 13.5, platelets 181, CMP: GFR 70, AST 24, ALT 25, ENA positive Ro only, Jo 1 -, lipid panel LDL 90, TSH normal   Speciality Comments: No specialty comments available.  Procedures:  No procedures performed Allergies: Penicillins and Sulfonamide derivatives   Assessment / Plan:     Visit Diagnoses: Sjogren's syndrome, with unspecified organ involvement ([place]) - Positive ANA, positive  Ro: She has dry eyes.  The symptoms are manageable with over-the-counter medications.  She denies any history of dry mouth.  There is no history of inflammatory arthritis, shortness of breath or palpitations.  She had labs done by her PCP recently which were reviewed.  Primary osteoarthritis of both hands-joint protection muscle strengthening was discussed.  Trochanteric bursitis of both hips-she has off-and-on discomfort over trochanteric area.  Fibromyalgia-she continues to have generalized pain and discomfort.  She has positive tender points.  She states the Topamax and Neurontin has been helpful in controlling her symptoms of fibromyalgia and also migraines.  I discussed tapering Topamax but she is hesitant to do that.  Chronic pain syndrome-she continues to have some generalized pain.  Secondary cardiomyopathy   Migraines-improved since she has been on Topamax.  Essential hypertension-blood pressure is normal.  Educated about COVID-19 virus infection-she had initial vaccination for COVID-19.  She has not received a booster.  Use of mask, social distancing and hand hygiene was discussed.  Booster was also discussed.  Osteoporosis screening-I  do not see DEXA scan.  Have advised her to get a DEXA scan.  She wants to discuss  with Dr. Jimmye Norman to get DEXA scan in [place].  Orders: No orders of the defined types were placed in this encounter.  No orders of the defined types were placed in this encounter.     Follow-Up Instructions: Return in about 5 months (around 02/27/2021) for Sjogren's, Osteoarthritis.   Bo Merino, MD  Note - This record has been created using Editor, commissioning.  Chart creation errors have been sought, but may not always  have been located. Such creation errors do not reflect on  the standard of medical care.

## 2020-09-29 ENCOUNTER — Encounter: Payer: Self-pay | Admitting: Rheumatology

## 2020-09-29 ENCOUNTER — Ambulatory Visit: Payer: BC Managed Care – PPO | Admitting: Rheumatology

## 2020-09-29 ENCOUNTER — Other Ambulatory Visit: Payer: Self-pay

## 2020-09-29 VITALS — BP 128/73 | HR 69 | Ht 65.0 in | Wt 227.0 lb

## 2020-09-29 DIAGNOSIS — G43809 Other migraine, not intractable, without status migrainosus: Secondary | ICD-10-CM

## 2020-09-29 DIAGNOSIS — Z7189 Other specified counseling: Secondary | ICD-10-CM

## 2020-09-29 DIAGNOSIS — M19042 Primary osteoarthritis, left hand: Secondary | ICD-10-CM

## 2020-09-29 DIAGNOSIS — Z13820 Encounter for screening for osteoporosis: Secondary | ICD-10-CM

## 2020-09-29 DIAGNOSIS — G894 Chronic pain syndrome: Secondary | ICD-10-CM

## 2020-09-29 DIAGNOSIS — M7062 Trochanteric bursitis, left hip: Secondary | ICD-10-CM

## 2020-09-29 DIAGNOSIS — M7061 Trochanteric bursitis, right hip: Secondary | ICD-10-CM

## 2020-09-29 DIAGNOSIS — I10 Essential (primary) hypertension: Secondary | ICD-10-CM

## 2020-09-29 DIAGNOSIS — I429 Cardiomyopathy, unspecified: Secondary | ICD-10-CM

## 2020-09-29 DIAGNOSIS — M3500 Sicca syndrome, unspecified: Secondary | ICD-10-CM

## 2020-09-29 DIAGNOSIS — M797 Fibromyalgia: Secondary | ICD-10-CM

## 2020-09-29 DIAGNOSIS — M19041 Primary osteoarthritis, right hand: Secondary | ICD-10-CM

## 2020-09-29 NOTE — Patient Instructions (Signed)
COVID-19 vaccine recommendations:   COVID-19 vaccine is recommended for everyone (unless you are allergic to a vaccine component), even if you are on a medication that suppresses your immune system.   Do not take Tylenol or any anti-inflammatory medications (NSAIDs) 24 hours prior to the COVID-19 vaccination.   There is no direct evidence about the efficacy of the COVID-19 vaccine in individuals who are on medications that suppress the immune system.   Even if you are fully vaccinated, and you are on any medications that suppress your immune system, please continue to wear a mask, maintain at least six feet social distance and practice hand hygiene.   If you develop a COVID-19 infection, please contact your PCP or our office to determine if you need monoclonal antibody infusion.  The booster vaccine is now available for immunocompromised patients.   Please see the following web sites for updated information.   [email or web address]   Please get a DEXA scan with Dr. Jimmye Norman

## 2020-10-08 DIAGNOSIS — M3500 Sicca syndrome, unspecified: Secondary | ICD-10-CM | POA: Diagnosis not present

## 2020-10-08 DIAGNOSIS — I10 Essential (primary) hypertension: Secondary | ICD-10-CM | POA: Diagnosis not present

## 2020-10-08 DIAGNOSIS — R21 Rash and other nonspecific skin eruption: Secondary | ICD-10-CM | POA: Diagnosis not present

## 2020-10-08 DIAGNOSIS — M797 Fibromyalgia: Secondary | ICD-10-CM | POA: Diagnosis not present

## 2020-10-26 ENCOUNTER — Ambulatory Visit: Payer: BC Managed Care – PPO | Admitting: Psychiatry

## 2020-12-01 ENCOUNTER — Other Ambulatory Visit: Payer: Self-pay

## 2020-12-01 ENCOUNTER — Ambulatory Visit: Payer: BC Managed Care – PPO | Admitting: Obstetrics & Gynecology

## 2020-12-01 ENCOUNTER — Encounter: Payer: Self-pay | Admitting: Obstetrics & Gynecology

## 2020-12-01 VITALS — BP 138/59 | HR 52 | Wt 230.0 lb

## 2020-12-01 DIAGNOSIS — G5782 Other specified mononeuropathies of left lower limb: Secondary | ICD-10-CM | POA: Diagnosis not present

## 2020-12-01 NOTE — Progress Notes (Signed)
Chief Complaint  Patient presents with  . Pelvic Pain      60 y.o. No obstetric history on file. No LMP recorded. Patient has had a hysterectomy. The current method of family planning is post menopausal status.  Outpatient Encounter Medications as of 12/01/2020  Medication Sig  . buPROPion (WELLBUTRIN XL) 300 MG 24 hr tablet Take 1 tablet (300 mg total) by mouth daily.  . carvedilol (COREG) 6.25 MG tablet TAKE 1 TABLET (6.25 MG TOTAL) BY MOUTH 2 (TWO) TIMES DAILY WITH A MEAL.  [name] diclofenac sodium (VOLTAREN) 1 % GEL Apply 2 grams to 4 grams to affected area up to 4 times daily as needed  . gabapentin (NEURONTIN) 100 MG capsule Take 1 capsule (100 mg total) by mouth at bedtime.  [name] nystatin (MYCOSTATIN/NYSTOP) powder Apply topically 2 (two) times daily.  [name] nystatin-triamcinolone ointment (MYCOLOG) Apply 1 application topically 2 (two) times daily.  . risperiDONE (RISPERDAL) 3 MG tablet Take 0.5 tablets (1.5 mg total) by mouth daily.  [name] topiramate (TOPAMAX) 100 MG tablet Take 1 tablet (100 mg total) by mouth daily.  . naproxen (NAPROSYN) 500 MG tablet Take 500 mg by mouth 2 (two) times daily. (Patient not taking: Reported on 12/01/2020)   No facility-administered encounter medications on file as of 12/01/2020.    Subjective Brittany Sweeney presents to the office today with complaint of left lower quadrant pain She states the pain has been occurring over the last couple of months It last about 30 seconds Is sharp Radiates toward her left hip Not associated with fevers chills nausea vomiting diarrhea constipation Not associated with any urinary tract symptoms Happens in the afternoon after she has been up on her feet all day She works at [employer] She does have fibromyalgia She states the intensity is moderate It occurs about once every 3 to 5 days Past Medical History:  Diagnosis Date  . Cardiomyopathy   . Depression   . Fibromyalgia   . Hypertension     Past  Surgical History:  Procedure Laterality Date  . ABDOMINAL HYSTERECTOMY      OB History   No obstetric history on file.     Allergies  Allergen Reactions  . Penicillins Rash  . Sulfonamide Derivatives Hives    Social History   Socioeconomic History  . Marital status: Married    Spouse name: Not on file  . Number of children: Not on file  . Years of education: Not on file  . Highest education level: Not on file  Occupational History  . Not on file  Tobacco Use  . Smoking status: Never Smoker  . Smokeless tobacco: Never Used  Vaping Use  . Vaping Use: Never used  Substance and Sexual Activity  . Alcohol use: No    Alcohol/week: 0.0 standard drinks  . Drug use: Never  . Sexual activity: Yes    Birth control/protection: Surgical  Other Topics Concern  . Not on file  Social History Narrative  . Not on file   Social Determinants of Health   Financial Resource Strain: Not on file  Food Insecurity: Not on file  Transportation Needs: Not on file  Physical Activity: Not on file  Stress: Not on file  Social Connections: Not on file    Family History  Problem Relation Age of Onset  . Hypertension Mother     Medications:       Current Outpatient Medications:  .  buPROPion (WELLBUTRIN XL) 300 MG 24  hr tablet, Take 1 tablet (300 mg total) by mouth daily., Disp: 90 tablet, Rfl: 1 .  carvedilol (COREG) 6.25 MG tablet, TAKE 1 TABLET (6.25 MG TOTAL) BY MOUTH 2 (TWO) TIMES DAILY WITH A MEAL., Disp: 180 tablet, Rfl: 3 .  diclofenac sodium (VOLTAREN) 1 % GEL, Apply 2 grams to 4 grams to affected area up to 4 times daily as needed, Disp: 4 Tube, Rfl: 2 .  gabapentin (NEURONTIN) 100 MG capsule, Take 1 capsule (100 mg total) by mouth at bedtime., Disp: 90 capsule, Rfl: 1 .  nystatin (MYCOSTATIN/NYSTOP) powder, Apply topically 2 (two) times daily., Disp: , Rfl:  .  nystatin-triamcinolone ointment (MYCOLOG), Apply 1 application topically 2 (two) times daily., Disp: 30 g, Rfl:  11 .  risperiDONE (RISPERDAL) 3 MG tablet, Take 0.5 tablets (1.5 mg total) by mouth daily., Disp: 45 tablet, Rfl: 1 .  topiramate (TOPAMAX) 100 MG tablet, Take 1 tablet (100 mg total) by mouth daily., Disp: 90 tablet, Rfl: 0 .  naproxen (NAPROSYN) 500 MG tablet, Take 500 mg by mouth 2 (two) times daily. (Patient not taking: Reported on 12/01/2020), Disp: , Rfl:   Objective Blood pressure (!) 138/59, pulse (!) 52, weight 230 lb (104.3 kg).  General WDWN female NAD There is point tenderness at the junction of the left rectus abdominis and transversalis muscle about 3 cm above the pubic bone insertion, in the area of a ganglion associated with the left ilioinguinal nerve, she has no pain with deep palpation Vulva:  normal appearing vulva with no masses, tenderness or lesions Vagina:  normal mucosa, no discharge  Bimanual exam is negative for any masses or tenderness  Pertinent ROS No burning with urination, frequency or urgency No nausea, vomiting or diarrhea Nor fever chills or other constitutional symptoms   Labs or studies No new    Impression Diagnoses this Encounter::   ICD-10-CM   1. 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   Labs or Scans Ordered: No orders of the defined types were placed in this encounter.   Management:: recontact the office if her pain worsens or intensifies The exam and her history are consistent with abdominal wall pain, no evidence of intra abdominal etiology at this point If however the pain worsens or becomes more frequent we will order a pelvic ultrasound to evaluate her ovaries although at this point I do not think that is part of the differential   Follow up Return if symptoms worsen or fail to improve.     All questions were answered.

## 2021-01-29 DIAGNOSIS — K635 Polyp of colon: Secondary | ICD-10-CM | POA: Diagnosis not present

## 2021-01-29 DIAGNOSIS — Z6837 Body mass index (BMI) 37.0-37.9, adult: Secondary | ICD-10-CM | POA: Diagnosis not present

## 2021-01-29 DIAGNOSIS — N390 Urinary tract infection, site not specified: Secondary | ICD-10-CM | POA: Diagnosis not present

## 2021-02-08 NOTE — Progress Notes (Signed)
Office Visit Note  Patient: Brittany Sweeney             Date of Birth: [birth year]           MRN: [id number]             PCP: [place] Nation, MD Referring: [place] Nation, MD Visit Date: 02/22/2021 Occupation: @GUAROCC @  Subjective:  Dry eyes   History of Present Illness: Brittany Sweeney is a 60 y.o. female with history of sjogren's syndrome, osteoarthritis, and fibromyalgia. She denies any signs or symptoms of a sjogren's flare.  She states she continues to have eye dryness and uses systane complete eyedrops daily.  She sees her ophthalmologist on a regular basis.  She denies any mouth dryness.  She continues to see her dentist every 6 months and has occasional dental caries.  She denies any parotid tenderness or swelling. She denies any swollen lymph nodes.  She denies any shortness of breath.  She continues to experience intermittent myalgias, arthralgias, and joint stiffness.  She denies any joint swelling.  She states she has a fibromyalgia flare every 4-6 weeks.  Her flares are typically exacerbated by weather changes. She has not been using voltaren gel, but she remains on  topamax 100 mg at bedtime and gabapentin 100 mg at bedtime for management of fibromyalgia.   Activities of Daily Living:  Patient reports morning stiffness for several hours.   Patient Reports nocturnal pain.  Difficulty dressing/grooming: Denies Difficulty climbing stairs: Reports Difficulty getting out of chair: Reports Difficulty using hands for taps, buttons, cutlery, and/or writing: Reports  Review of Systems  Constitutional: Positive for fatigue.  HENT: Negative for mouth sores, mouth dryness and nose dryness.   Eyes: Positive for dryness. Negative for pain and visual disturbance.  Respiratory: Negative for cough, hemoptysis, shortness of breath and difficulty breathing.   Cardiovascular: Negative for chest pain, palpitations, hypertension and swelling in legs/feet.  Gastrointestinal: Negative  for blood in stool, constipation and diarrhea.  Endocrine: Negative for increased urination.  Genitourinary: Negative for painful urination.  Musculoskeletal: Positive for arthralgias, joint pain, morning stiffness and muscle tenderness. Negative for joint swelling, myalgias, muscle weakness and myalgias.  Skin: Negative for color change, pallor, rash, hair loss, nodules/bumps, skin tightness, ulcers and sensitivity to sunlight.  Allergic/Immunologic: Negative for susceptible to infections.  Neurological: Negative for dizziness, numbness, headaches and weakness.  Hematological: Negative for swollen glands.  Psychiatric/Behavioral: Negative for depressed mood and sleep disturbance. The patient is not nervous/anxious.     PMFS History:  Patient Active Problem List   Diagnosis Date Noted  . Sjogren's syndrome ([place]) 09/22/2016  . Fibromyalgia 09/22/2016  . Chronic pain syndrome 09/22/2016  . Osteoarthritis of both hands 09/22/2016  . Migraine 09/22/2016  . Schizophrenia, paranoid type ([place]) 04/25/2012  . DEPRESSION 10/21/2010  . OVERWEIGHT/OBESITY 12/18/2009  . Hypertension 12/18/2009  . 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 Cardiomyopathy   . Depression   . Fibromyalgia   . Hypertension     Family History  Problem Relation Age of Onset  . Hypertension Mother    Past Surgical History:  Procedure Laterality Date  . ABDOMINAL HYSTERECTOMY     Social History   Social History Narrative  . Not on file   Immunization History  Administered Date(s) Administered  . Janssen (J&J) SARS-COV-2 Vaccination 03/14/2020     Objective: Vital Signs: BP 105/62 (BP Location: Left Arm, Patient Position: Sitting, Cuff  Size: Normal)   Pulse 94   Ht 5\' 5"  (1.651 m)   Wt 231 lb 6.4 oz (105 kg)   BMI 38.51 kg/m    Physical Exam Vitals and nursing note reviewed.  Constitutional:      Appearance: She is well-developed.  HENT:     Head:  Normocephalic and atraumatic.  Eyes:     Conjunctiva/sclera: Conjunctivae normal.  Pulmonary:     Effort: Pulmonary effort is normal.  Abdominal:     Palpations: Abdomen is soft.  Musculoskeletal:     Cervical back: Normal range of motion.  Skin:    General: Skin is warm and dry.     Capillary Refill: Capillary refill takes less than 2 seconds.  Neurological:     Mental Status: She is alert and oriented to person, place, and time.  Psychiatric:        Behavior: Behavior normal.      Musculoskeletal Exam: C-spine, thoracic spine, and lumbar spine good ROM with no discomfort.  Shoulder joints good ROM with some discomfort in the left shoulder.  Elbow joints, wrist joints, MCPs, PIPs, and DIPs good ROM with no synovitis.  Complete fist formation bilaterally.  Hip joints, knee joints, and ankle joints good ROM.  No tenderness over trochanteric bursa.  No warmth or effusion of knee joints.  Tenderness over both ankles but no inflammation noted.   CDAI Exam: CDAI Score: -- Patient Global: --; Provider Global: -- Swollen: --; Tender: -- Joint Exam 02/22/2021   No joint exam has been documented for this visit   There is currently no information documented on the homunculus. Go to the Rheumatology activity and complete the homunculus joint exam.  Investigation: No additional findings.  Imaging: No results found.  Recent Labs: Lab Results  Component Value Date   WBC 5.4 04/23/2012   HGB 14.2 04/23/2012   PLT 229 04/23/2012   NA 140 04/30/2012   K 3.9 04/30/2012   CL 105 04/30/2012   CO2 27 04/30/2012   GLUCOSE 90 04/30/2012   BUN 15 04/30/2012   CREATININE 0.79 04/30/2012   BILITOT 0.1 (L) 04/27/2012   ALKPHOS 70 04/27/2012   AST 13 04/27/2012   ALT 14 04/27/2012   PROT 7.0 04/27/2012   ALBUMIN 3.6 04/27/2012   CALCIUM 9.4 04/30/2012   GFRAA >90 04/30/2012    Speciality Comments: No specialty comments available.  Procedures:  No procedures performed Allergies:  Penicillins and Sulfonamide derivatives   Assessment / Plan:     Visit Diagnoses: Sjogren's syndrome, with unspecified organ involvement ([place]) - Positive ANA, positive Ro: She has not had any signs or symptoms of a flare recently.  She has not taking any immunosuppressive agents at this time.  She continues to have chronic dry eyes and uses Systane complete eyedrops on a daily basis as recommended by her ophthalmologist.  She has not been experiencing any mouth dryness and has not been using any over-the-counter products for symptomatic relief.  She had no parotid swelling or tenderness on exam.  No cervical lymphadenopathy was palpable.  She continues to see her dentist every 6 months and has occasional dental caries.  We discussed the importance of good oral hygiene.  She has no signs of inflammatory arthritis.  No synovitis was noted on examination today.  She has not been experiencing any shortness of breath or palpitations.  We discussed updating lab work today but she declined.  She does not require immunosuppressive therapy at this time.  She was  advised to notify us if she develops any new or worsening symptoms.  She will follow up in 6 months.  Primary osteoarthritis of both hands: She has occasional pain and stiffness in both hands but no joint swelling.  No synovitis was noted on exam.  Complete fist formation bilaterally.   Trochanteric bursitis of both hips: She has no tenderness to palpation on exam.   Fibromyalgia: She has intermittent myalgias and muscle tenderness due to underlying fibromyalgia.  She has a flare every 4 to 6 weeks which is typically exacerbated by weather changes.  She continues to take gabapentin 100 mg at bedtime and Topamax 100 mg at bedtime prescribed by her PCP.  We discussed the importance of regular exercise and good sleep hygiene.  Chronic pain syndrome: She takes gabapentin 100 mg 1 capsule at bedtime.    Other medical conditions are listed as follows:    Secondary cardiomyopathy   Essential hypertension: BP was 105/62 today in the office.   Migraines -She takes topamax 100 mg by mouth at bedtime prescribed by her PCP.   Orders: No orders of the defined types were placed in this encounter.  No orders of the defined types were placed in this encounter.    Follow-Up Instructions: Return in about 6 months (around 08/25/2021) for Sjogren's syndrome, Osteoarthritis, Fibromyalgia.   Ofilia Neas, PA-C  Note - This record has been created using Dragon software.  Chart creation errors have been sought, but may not always  have been located. Such creation errors do not reflect on  the standard of medical care.

## 2021-02-18 DIAGNOSIS — Z79899 Other long term (current) drug therapy: Secondary | ICD-10-CM | POA: Diagnosis not present

## 2021-02-18 DIAGNOSIS — I10 Essential (primary) hypertension: Secondary | ICD-10-CM | POA: Diagnosis not present

## 2021-02-18 DIAGNOSIS — R109 Unspecified abdominal pain: Secondary | ICD-10-CM | POA: Diagnosis not present

## 2021-02-18 DIAGNOSIS — D120 Benign neoplasm of cecum: Secondary | ICD-10-CM | POA: Diagnosis not present

## 2021-02-18 DIAGNOSIS — K635 Polyp of colon: Secondary | ICD-10-CM | POA: Diagnosis not present

## 2021-02-18 DIAGNOSIS — K625 Hemorrhage of anus and rectum: Secondary | ICD-10-CM | POA: Diagnosis not present

## 2021-02-18 DIAGNOSIS — Z86010 Personal history of colonic polyps: Secondary | ICD-10-CM | POA: Diagnosis not present

## 2021-02-18 DIAGNOSIS — M797 Fibromyalgia: Secondary | ICD-10-CM | POA: Diagnosis not present

## 2021-02-18 DIAGNOSIS — Z880 Allergy status to penicillin: Secondary | ICD-10-CM | POA: Diagnosis not present

## 2021-02-18 DIAGNOSIS — E669 Obesity, unspecified: Secondary | ICD-10-CM | POA: Diagnosis not present

## 2021-02-18 DIAGNOSIS — Z882 Allergy status to sulfonamides status: Secondary | ICD-10-CM | POA: Diagnosis not present

## 2021-02-18 DIAGNOSIS — K644 Residual hemorrhoidal skin tags: Secondary | ICD-10-CM | POA: Diagnosis not present

## 2021-02-18 DIAGNOSIS — Z6836 Body mass index (BMI) 36.0-36.9, adult: Secondary | ICD-10-CM | POA: Diagnosis not present

## 2021-02-22 ENCOUNTER — Encounter: Payer: Self-pay | Admitting: Physician Assistant

## 2021-02-22 ENCOUNTER — Other Ambulatory Visit: Payer: Self-pay

## 2021-02-22 ENCOUNTER — Ambulatory Visit: Payer: BC Managed Care – PPO | Admitting: Physician Assistant

## 2021-02-22 VITALS — BP 105/62 | HR 94 | Ht 65.0 in | Wt 231.4 lb

## 2021-02-22 DIAGNOSIS — M7061 Trochanteric bursitis, right hip: Secondary | ICD-10-CM | POA: Diagnosis not present

## 2021-02-22 DIAGNOSIS — M19041 Primary osteoarthritis, right hand: Secondary | ICD-10-CM

## 2021-02-22 DIAGNOSIS — I429 Cardiomyopathy, unspecified: Secondary | ICD-10-CM

## 2021-02-22 DIAGNOSIS — M7062 Trochanteric bursitis, left hip: Secondary | ICD-10-CM

## 2021-02-22 DIAGNOSIS — M797 Fibromyalgia: Secondary | ICD-10-CM

## 2021-02-22 DIAGNOSIS — I10 Essential (primary) hypertension: Secondary | ICD-10-CM

## 2021-02-22 DIAGNOSIS — G43809 Other migraine, not intractable, without status migrainosus: Secondary | ICD-10-CM

## 2021-02-22 DIAGNOSIS — G894 Chronic pain syndrome: Secondary | ICD-10-CM

## 2021-02-22 DIAGNOSIS — M19042 Primary osteoarthritis, left hand: Secondary | ICD-10-CM

## 2021-02-22 DIAGNOSIS — M3500 Sicca syndrome, unspecified: Secondary | ICD-10-CM

## 2021-03-10 DIAGNOSIS — Z6837 Body mass index (BMI) 37.0-37.9, adult: Secondary | ICD-10-CM | POA: Diagnosis not present

## 2021-03-10 DIAGNOSIS — K625 Hemorrhage of anus and rectum: Secondary | ICD-10-CM | POA: Diagnosis not present

## 2021-03-15 ENCOUNTER — Ambulatory Visit (INDEPENDENT_AMBULATORY_CARE_PROVIDER_SITE_OTHER): Payer: BC Managed Care – PPO | Admitting: Psychiatry

## 2021-03-15 ENCOUNTER — Other Ambulatory Visit: Payer: Self-pay

## 2021-03-15 ENCOUNTER — Encounter: Payer: Self-pay | Admitting: Psychiatry

## 2021-03-15 DIAGNOSIS — G43009 Migraine without aura, not intractable, without status migrainosus: Secondary | ICD-10-CM

## 2021-03-15 DIAGNOSIS — F333 Major depressive disorder, recurrent, severe with psychotic symptoms: Secondary | ICD-10-CM | POA: Diagnosis not present

## 2021-03-15 DIAGNOSIS — F411 Generalized anxiety disorder: Secondary | ICD-10-CM | POA: Diagnosis not present

## 2021-03-15 MED ORDER — BUPROPION HCL ER (XL) 300 MG PO TB24: 300.0000 mg | ORAL_TABLET | Freq: Every day | ORAL | 1 refills | Status: DC

## 2021-03-15 MED ORDER — GABAPENTIN 100 MG PO CAPS: 100.0000 mg | ORAL_CAPSULE | Freq: Every day | ORAL | 1 refills | Status: DC

## 2021-03-15 MED ORDER — RISPERIDONE 3 MG PO TABS: 1.5000 mg | ORAL_TABLET | Freq: Every day | ORAL | 1 refills | Status: DC

## 2021-03-15 MED ORDER — TOPIRAMATE 100 MG PO TABS: 100.0000 mg | ORAL_TABLET | Freq: Every day | ORAL | 3 refills | Status: DC

## 2021-03-15 NOTE — Progress Notes (Signed)
Brittany Sweeney [id number] 1961/06/16 60 y.o.   Subjective:   [name] is a 60 y.o. (DOB 1961-06-23) female.  Chief Complaint:  Chief Complaint  Patient presents with  . Follow-up  . Depression  . Anxiety    HPI Brittany Sweeney presents  today for follow-up of depression and anxiety.   seen November 2020 & 02/2020.  No meds were changed.  She continued on Wellbutrin XL 300 mg in the morning, gabapentin 100 nightly, risperidone 3 mg 1/2 tablet nightly, Topamax 100 mg tablets 1-1/2 tablets daily.  09/14/20 appt with following noted: Dx. Sjogren's syndrome. Tired from working 3rd shift and otherwise Doing fine.  Ready to retire and hopes for May 2022.  If can get insurance.  Sleep is good and better with 2nd shift.   HA managed with topiramate given originally by neurology. Vaccinated. Plan: No med changes  03/15/2021 appointment with the following noted: Good with mental health.  Not usually depressed.  Going to retire May 05, 2021.  Needs to keep insurance.  Son concerned she won't stay busy enough. Doesn't like her job bc it's hot and hard on body. HA managed Patient reports stable mood and denies depressed or irritable moods. A little restless.  Patient denies any recent difficulty with anxiety.  Patient denies difficulty usually with sleep initiation or maintenance. 8-9 hours.  Denies appetite disturbance.  Patient reports that energy and motivation have been good.  Patient denies any difficulty with concentration.  Patient denies any suicidal ideation.  No fear.  No voices.  Plans to retire at 60 yo.  Past Psychiatric Medication Trials: Sertraline cognitive side effects,  Wellbutrin 300,  risperidone 1.5,  gabapentin History of psychiatric hospitalization with depression, suicidal thoughts, and paranoia..  Long history of recurrent depression and paranoia  Review of Systems:  Review of Systems  Constitutional: Positive for fatigue.  Cardiovascular:  Negative for palpitations.  Skin:       Skin crawling still some but less and all over but mostly trunk.  Neurological: Negative for tremors and weakness.  Psychiatric/Behavioral: Negative for agitation, behavioral problems, confusion, decreased concentration, dysphoric mood, hallucinations, self-injury, sleep disturbance and suicidal ideas. The patient is not nervous/anxious and is not hyperactive.     Medications: I have reviewed the patient's current medications.  Current Outpatient Medications  Medication Sig Dispense Refill  . buPROPion (WELLBUTRIN XL) 300 MG 24 hr tablet Take 1 tablet (300 mg total) by mouth daily. 90 tablet 1  . carvedilol (COREG) 6.25 MG tablet TAKE 1 TABLET (6.25 MG TOTAL) BY MOUTH 2 (TWO) TIMES DAILY WITH A MEAL. 180 tablet 3  . gabapentin (NEURONTIN) 100 MG capsule Take 1 capsule (100 mg total) by mouth at bedtime. 90 capsule 1  . nystatin (MYCOSTATIN/NYSTOP) powder Apply topically 2 (two) times daily.    [name] nystatin-triamcinolone ointment (MYCOLOG) Apply 1 application topically 2 (two) times daily. 30 g 11  . risperiDONE (RISPERDAL) 3 MG tablet Take 0.5 tablets (1.5 mg total) by mouth daily. 45 tablet 1  . topiramate (TOPAMAX) 100 MG tablet Take 1 tablet (100 mg total) by mouth daily. 90 tablet 0   No current facility-administered medications for this visit.    Medication Side Effects: None  Allergies:  Allergies  Allergen Reactions  . Penicillins Rash  . Sulfonamide Derivatives Hives    Past Medical History:  Diagnosis Date  . Cardiomyopathy   . Depression   . Fibromyalgia   . Hypertension  Family History  Problem Relation Age of Onset  . Hypertension Mother     Social History   Socioeconomic History  . Marital status: Married    Spouse name: Not on file  . Number of children: Not on file  . Years of education: Not on file  . Highest education level: Not on file  Occupational History  . Not on file  Tobacco Use  . Smoking status:  Never Smoker  . Smokeless tobacco: Never Used  Vaping Use  . Vaping Use: Never used  Substance and Sexual Activity  . Alcohol use: No    Alcohol/week: 0.0 standard drinks  . Drug use: Never  . Sexual activity: Yes    Birth control/protection: Surgical  Other Topics Concern  . Not on file  Social History Narrative  . Not on file   Social Determinants of Health   Financial Resource Strain: Not on file  Food Insecurity: Not on file  Transportation Needs: Not on file  Physical Activity: Not on file  Stress: Not on file  Social Connections: Not on file  Intimate Partner Violence: Not on file    Past Medical History, Surgical history, Social history, and Family history were reviewed and updated as appropriate.   Please see review of systems for further details on the patient's review from today.   Objective:   Physical Exam:  There were no vitals taken for this visit.  Physical Exam Constitutional:      General: She is not in acute distress. Musculoskeletal:        General: No deformity.  Neurological:     Mental Status: She is alert and oriented to person, place, and time.     Cranial Nerves: No dysarthria.     Coordination: Coordination normal.  Psychiatric:        Attention and Perception: Attention and perception normal. She does not perceive auditory or visual hallucinations.        Mood and Affect: Mood normal. Mood is not anxious or depressed. Affect is not labile, blunt, angry or inappropriate.        Speech: Speech normal.        Behavior: Behavior is slowed. Behavior is cooperative.        Thought Content: Thought content normal. Thought content is not paranoid or delusional. Thought content does not include homicidal or suicidal ideation. Thought content does not include homicidal or suicidal plan.        Cognition and Memory: Cognition and memory normal.        Judgment: Judgment normal.     Comments: No concerns about meds but eventually wants to stop when  retires. Fair insight. Chronically a little slow.     Lab Review:     Component Value Date/Time   NA 140 04/30/2012 0613   K 3.9 04/30/2012 0613   CL 105 04/30/2012 0613   CO2 27 04/30/2012 0613   GLUCOSE 90 04/30/2012 0613   BUN 15 04/30/2012 0613   CREATININE 0.79 04/30/2012 0613   CALCIUM 9.4 04/30/2012 0613   PROT 7.0 04/27/2012 1930   ALBUMIN 3.6 04/27/2012 1930   AST 13 04/27/2012 1930   ALT 14 04/27/2012 1930   ALKPHOS 70 04/27/2012 1930   BILITOT 0.1 (L) 04/27/2012 1930   GFRNONAA >90 04/30/2012 0613   GFRAA >90 04/30/2012 0613       Component Value Date/Time   WBC 5.4 04/23/2012 2131   RBC 4.54 04/23/2012 2131   HGB 14.2 04/23/2012 2131  HCT 40.0 04/23/2012 2131   PLT 229 04/23/2012 2131   MCV 88.1 04/23/2012 2131   MCH 31.3 04/23/2012 2131   MCHC 35.5 04/23/2012 2131   RDW 12.4 04/23/2012 2131   LYMPHSABS 2.1 04/23/2012 2131   MONOABS 0.4 04/23/2012 2131   EOSABS 0.1 04/23/2012 2131   BASOSABS 0.0 04/23/2012 2131    No results found for: POCLITH, LITHIUM   No results found for: PHENYTOIN, PHENOBARB, VALPROATE, CBMZ   .res Assessment: Plan:    Severe recurrent major depression with psychotic features ([place])  Generalized anxiety disorder  Migraine without aura and without status migrainosus, not intractable   SX in remission with the meds and she has no med complaints.  She's pleased and satisfied.  History of relapse with attempts to decrease the Risperidone.  Therefore need to continue the current dosage.  She wants to consider coming off some of the medication but it would be risky to do so.  She agrees to defer any medicine reduction until after retirement.  Discussed potential metabolic side effects associated with atypical antipsychotics, as well as potential risk for movement side effects. Advised pt to contact office if movement side effects occur.  Labs per pcp.  Looked good today including glucose.  No problems with the wellbutrin xl  300.  Gabapentin helps for pain.   HA ok with reduced topiramate to 100 mg .  Disc importance of staying active in retirement.  Some concerns her plans seem inadequate and son agrees. Disc relapse prevention.  No med changes today  FU 6 mos  Lynder Parents, MD, DFAPA   Please see After Visit Summary for patient specific instructions.  Future Appointments  Date Time Provider [place]  03/15/2021  1:30 PM Cottle, Billey Co., MD [facility] None  08/31/2021  1:00 PM Bo Merino, MD [facility] None    No orders of the defined types were placed in this encounter.     -------------------------------

## 2021-03-19 DIAGNOSIS — I499 Cardiac arrhythmia, unspecified: Secondary | ICD-10-CM | POA: Diagnosis not present

## 2021-03-19 DIAGNOSIS — I509 Heart failure, unspecified: Secondary | ICD-10-CM | POA: Diagnosis not present

## 2021-03-19 DIAGNOSIS — Z6837 Body mass index (BMI) 37.0-37.9, adult: Secondary | ICD-10-CM | POA: Diagnosis not present

## 2021-03-31 ENCOUNTER — Ambulatory Visit: Payer: BC Managed Care – PPO | Admitting: Internal Medicine

## 2021-03-31 ENCOUNTER — Other Ambulatory Visit: Payer: Self-pay

## 2021-03-31 ENCOUNTER — Encounter: Payer: Self-pay | Admitting: Internal Medicine

## 2021-03-31 VITALS — BP 130/78 | HR 73 | Ht 65.0 in | Wt 227.0 lb

## 2021-03-31 DIAGNOSIS — I10 Essential (primary) hypertension: Secondary | ICD-10-CM | POA: Diagnosis not present

## 2021-03-31 NOTE — Progress Notes (Signed)
Cardiology Office Note   Date:  03/31/2021   ID:  Brittany Sweeney, DOB 1961-04-22, MRN [id number]  PCP:   Nation, MD  Cardiologist:   Dorris Carnes, MD   Pt with hx of cardimyopathy/CHF in past   Presents for f/u    History of Present Illness: Brittany Sweeney is a 60 y.o. female with hx of fibromyalgia, HTN and cardiomyopathy who hs been seen in our clinic before   She was seen in early  2000s by T Wall for cardiomyopathy  LVEF ws reduced in 2001   It then normalized  Last echo 2005. She was last in cardiology clinic in 2016  Seen by Zandra Abts  The pt says her breathing is OK  She denies CP  No PND  No orthopnea  No LE edema   She does not check her BP at home    Note she was seen by Dr Jimmye Norman   BP 140/     He ordered an echocardiogram   This showed LVEF was normal sized and thickness; LVEf was normal  There was gr I diastolic dysfunction; LA was moderately dilated    Current Meds  Medication Sig  . buPROPion (WELLBUTRIN XL) 300 MG 24 hr tablet Take 1 tablet (300 mg total) by mouth daily.  . busPIRone (BUSPAR) 15 MG tablet Take by mouth.  . carvedilol (COREG) 6.25 MG tablet TAKE 1 TABLET (6.25 MG TOTAL) BY MOUTH 2 (TWO) TIMES DAILY WITH A MEAL.  [name] gabapentin (NEURONTIN) 100 MG capsule Take 1 capsule (100 mg total) by mouth at bedtime.  [name] nystatin (MYCOSTATIN/NYSTOP) powder Apply topically 2 (two) times daily.  [name] nystatin-triamcinolone ointment (MYCOLOG) Apply 1 application topically 2 (two) times daily.  . risperiDONE (RISPERDAL) 3 MG tablet Take 0.5 tablets (1.5 mg total) by mouth daily.  [name] topiramate (TOPAMAX) 100 MG tablet Take 1 tablet (100 mg total) by mouth daily.     Allergies:   Penicillins and Sulfonamide derivatives   Past Medical History:  Diagnosis Date  . Cardiomyopathy   . Depression   . Fibromyalgia   . Hypertension     Past Surgical History:  Procedure Laterality Date  . ABDOMINAL HYSTERECTOMY       Social History:  The patient  reports  that she has never smoked. She has never used smokeless tobacco. She reports that she does not drink alcohol and does not use drugs.   Family History:  The patient's family history includes Hypertension in her mother.    ROS:  Please see the history of present illness. All other systems are reviewed and  Negative to the above problem except as noted.    PHYSICAL EXAM: VS:  BP 130/78   Pulse 73   Ht 5\' 5"  (1.651 m)   Wt 227 lb (103 kg)   SpO2 96%   BMI 37.77 kg/m   GEN: Obese 60 yo  in no acute distress  HEENT: normal  Neck: no JVD, carotid bruits, Cardiac: RRR; no murmurs,  No LE edema  Respiratory:  clear to auscultation bilaterally, GI: soft, nontender, nondistended, + BS  No hepatomegaly  MS: no deformity Moving all extremities   Skin: warm and dry, no rash Neuro:  Strength and sensation are intact Psych: euthymic mood, full affect   EKG:  EKG is ordered today.  SR 65bpm   Low voltage EKG     Lipid Panel    Component Value Date/Time   CHOL 181 07/02/2009 0000  TRIG 207 07/02/2009 0000   HDL 54 07/02/2009 0000   LDLCALC 86 07/02/2009 0000      Wt Readings from Last 3 Encounters:  03/31/21 227 lb (103 kg)  02/22/21 231 lb 6.4 oz (105 kg)  12/01/20 230 lb (104.3 kg)      ASSESSMENT AND PLAN:  1  Hx cardiomyopathy  Remote   LV function recovered   Recent echo confirms thsi     Has remained on coreg    Clincally she is doing well   Cardiac exam is normal   I would keep her on current regimen     2  HTN  BP is OK today on current regimen    Ot was 105 / when last checked in clinic on 5/22   Was 140 / in internal medicine clinic  I would recomm following   I would not make changes unless persists in upper 130s or higher   Consider ARB at that time     3  Lipids   Last lipds in 2021 LDL 90  HDL 46  Follow   4  Obesity  Discussed diet  Cut back on carbs/sugar   Increase veggies, whole /nonprocessed foods    Plan for f/u in 1 year  Current medicines are  reviewed at length with the patient today.  The patient does not have concerns regarding medicines.  Signed, Dorris Carnes, MD  03/31/2021 5:37 PM    [address], [place], [place]  [address] Phone: [phone]; Fax: [phone]

## 2021-03-31 NOTE — Patient Instructions (Signed)
Medication Instructions:  Your physician recommends that you continue on your current medications as directed. Please refer to the Current Medication list given to you today.  *If you need a refill on your cardiac medications before your next appointment, please call your pharmacy*   Lab Work: NONE   If you have labs (blood work) drawn today and your tests are completely normal, you will receive your results only by: [name] MyChart Message (if you have MyChart) OR . A paper copy in the mail If you have any lab test that is abnormal or we need to change your treatment, we will call you to review the results.   Testing/Procedures: NONE    Follow-Up: At [facility], you and your health needs are our priority.  As part of our continuing mission to provide you with exceptional heart care, we have created designated Provider Care Teams.  These Care Teams include your primary Cardiologist (physician) and Advanced Practice Providers (APPs -  Physician Assistants and Nurse Practitioners) who all work together to provide you with the care you need, when you need it.  We recommend signing up for the patient portal called "MyChart".  Sign up information is provided on this After Visit Summary.  MyChart is used to connect with patients for Virtual Visits (Telemedicine).  Patients are able to view lab/test results, encounter notes, upcoming appointments, etc.  Non-urgent messages can be sent to your provider as well.   To learn more about what you can do with MyChart, go to [email or web address].    Your next appointment:   2 year(s)  The format for your next appointment:   In Person  Provider:   Carlyle Dolly, MD or Dorris Carnes, MD   Other Instructions Thank you for choosing [facility]!

## 2021-04-01 NOTE — Addendum Note (Signed)
Addended by: Barbarann Ehlers A on: 04/01/2021 08:39 AM   Modules accepted: Orders

## 2021-04-13 DIAGNOSIS — I10 Essential (primary) hypertension: Secondary | ICD-10-CM | POA: Diagnosis not present

## 2021-04-13 DIAGNOSIS — M3500 Sicca syndrome, unspecified: Secondary | ICD-10-CM | POA: Diagnosis not present

## 2021-04-13 DIAGNOSIS — I509 Heart failure, unspecified: Secondary | ICD-10-CM | POA: Diagnosis not present

## 2021-04-13 DIAGNOSIS — M797 Fibromyalgia: Secondary | ICD-10-CM | POA: Diagnosis not present

## 2021-05-17 ENCOUNTER — Ambulatory Visit: Payer: BC Managed Care – PPO | Admitting: Cardiology

## 2021-08-06 DIAGNOSIS — N390 Urinary tract infection, site not specified: Secondary | ICD-10-CM | POA: Diagnosis not present

## 2021-08-06 DIAGNOSIS — Z6837 Body mass index (BMI) 37.0-37.9, adult: Secondary | ICD-10-CM | POA: Diagnosis not present

## 2021-08-17 NOTE — Progress Notes (Signed)
Office Visit Note  Patient: Brittany Sweeney             Date of Birth: Aug 22, 1961           MRN: [id number]             PCP: Zion Nation, MD Referring: Niland Nation, MD Visit Date: 08/31/2021 Occupation: @GUAROCC @  Subjective:  Dry mouth and dry eyes.   History of Present Illness: Brittany Sweeney is a 60 y.o. female with a history of Sjogren's and osteoarthritis.  She states she has been using over-the-counter eyedrops which has been helpful.  She continues to have dry mouth.  She has some stiffness in her hands.  She has discomfort in her knee joints off and on.  She experiences generalized pain and discomfort from fibromyalgia and has intermittent flares.  The pain is usually worse during the winter.  Activities of Daily Living:  Patient reports morning stiffness for 33minutes.   Patient Reports nocturnal pain.  Difficulty dressing/grooming: Denies Difficulty climbing stairs: Denies Difficulty getting out of chair: Denies Difficulty using hands for taps, buttons, cutlery, and/or writing: Denies  Review of Systems  Constitutional:  Negative for fatigue, night sweats, weight gain and weight loss.  HENT:  Positive for mouth dryness. Negative for mouth sores, trouble swallowing, trouble swallowing and nose dryness.   Eyes:  Positive for dryness. Negative for pain, redness and visual disturbance.  Respiratory:  Negative for cough, shortness of breath and difficulty breathing.   Cardiovascular:  Negative for chest pain, palpitations, hypertension, irregular heartbeat and swelling in legs/feet.  Gastrointestinal:  Negative for blood in stool, constipation and diarrhea.  Endocrine: Negative for increased urination.  Genitourinary:  Negative for vaginal dryness.  Musculoskeletal:  Positive for joint pain, joint pain, myalgias, morning stiffness and myalgias. Negative for joint swelling, muscle weakness and muscle tenderness.  Skin:  Negative for color change, rash, hair  loss, skin tightness, ulcers and sensitivity to sunlight.  Allergic/Immunologic: Negative for susceptible to infections.  Neurological:  Negative for dizziness, memory loss, night sweats and weakness.  Hematological:  Negative for swollen glands.  Psychiatric/Behavioral:  Negative for depressed mood and sleep disturbance. The patient is not nervous/anxious.    PMFS History:  Patient Active Problem List   Diagnosis Date Noted   Sjogren's syndrome ([place]) 09/22/2016   Fibromyalgia 09/22/2016   Chronic pain syndrome 09/22/2016   Osteoarthritis of both hands 09/22/2016   Migraine 09/22/2016   Schizophrenia, paranoid type ([place]) 04/25/2012   DEPRESSION 10/21/2010   OVERWEIGHT/OBESITY 12/18/2009   Hypertension 12/18/2009   Secondary cardiomyopathy ([place]) 12/18/2009    Past Medical History:  Diagnosis Date   Cardiomyopathy    Depression    Fibromyalgia    Hypertension     Family History  Problem Relation Age of Onset   Hypertension Mother    Past Surgical History:  Procedure Laterality Date   ABDOMINAL HYSTERECTOMY     Social History   Social History Narrative   Not on file   Immunization History  Administered Date(s) Administered   Engineer, 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  Constitutional:      Appearance: She is well-developed.  HENT:     Head: Normocephalic and atraumatic.  Eyes:     Conjunctiva/sclera: Conjunctivae normal.  Cardiovascular:     Rate and Rhythm: Normal rate and regular rhythm.     Heart sounds: Normal heart sounds.  Pulmonary:     Effort: Pulmonary effort is normal.     Breath sounds: Normal breath sounds.  Abdominal:     General: Bowel sounds are normal.     Palpations: Abdomen is soft.   Musculoskeletal:     Cervical back: Normal range of motion.  Lymphadenopathy:     Cervical: No cervical adenopathy.  Skin:    General: Skin is warm and dry.     Capillary Refill: Capillary refill takes less than 2 seconds.  Neurological:     Mental Status: She is alert and oriented to person, place, and time.  Psychiatric:        Behavior: Behavior normal.     Musculoskeletal Exam: C-spine was in good range of motion.  Shoulder joints, elbow joints, wrist joints, MCPs PIPs and DIPs with good range of motion.  She had bilateral PIP and DIP thickening.  Hip joints and knee joints with good range of motion.  No warmth swelling or effusion was noted.  She had bilateral hammertoes.  CDAI Exam: CDAI Score: -- Patient Global: --; Provider Global: -- Swollen: --; Tender: -- Joint Exam 08/31/2021   No joint exam has been documented for this visit   There is currently no information documented on the homunculus. Go to the Rheumatology activity and complete the homunculus joint exam.  Investigation: No additional findings.  Imaging: No results found.  Recent Labs: Lab Results  Component Value Date   WBC 5.4 04/23/2012   HGB 14.2 04/23/2012   PLT 229 04/23/2012   NA 140 04/30/2012   K 3.9 04/30/2012   CL 105 04/30/2012   CO2 27 04/30/2012   GLUCOSE 90 04/30/2012   BUN 15 04/30/2012   CREATININE 0.79 04/30/2012   BILITOT 0.1 (L) 04/27/2012   ALKPHOS 70 04/27/2012   AST 13 04/27/2012   ALT 14 04/27/2012   PROT 7.0 04/27/2012   ALBUMIN 3.6 04/27/2012   CALCIUM 9.4 04/30/2012   GFRAA >90 04/30/2012    Speciality Comments: No specialty comments available.  Procedures:  No procedures performed Allergies: Penicillins and Sulfonamide derivatives   Assessment / Plan:     Visit Diagnoses: Sjogren's syndrome, with unspecified organ involvement ([place]) - Positive ANA, positive Ro: She has been using over-the-counter products for dry eyes.  She states her sicca symptoms are  manageable with over-the-counter products.  Information regarding Sjogren's was reviewed again per her request.  Handout was placed in the AVS.  She was advised to get CBC with differential, CMP with GFR and UA with her next labs.  Primary osteoarthritis of both hands-she had bilateral DIP and PIP thickening.  No synovitis was noted.  Joint protection muscle strengthening was discussed.  Trochanteric bursitis of both hips-she has some nocturnal pain off and on.  IT band stretches were discussed.  Primary osteoarthritis of both feet-she has bilateral hammertoes.  Proper fitting shoes were discussed.  Fibromyalgia -she continues to have generalized pain and discomfort.  Gabapentin 100 mg at bedtime and Topamax 100 mg at bedtime prescribed by her PCP.  Chronic pain syndrome  Secondary cardiomyopathy   Essential hypertension  Migraines - She takes topamax 100 mg by mouth at bedtime prescribed by her PCP.   Orders: No orders of the defined types were  placed in this encounter.  No orders of the defined types were placed in this encounter.    Follow-Up Instructions: Return in about 6 months (around 02/28/2022) for Sjogren's, Osteoarthritis.   Bo Merino, MD  Note - This record has been created using Editor, commissioning.  Chart creation errors have been sought, but may not always  have been located. Such creation errors do not reflect on  the standard of medical care.

## 2021-08-31 ENCOUNTER — Ambulatory Visit (INDEPENDENT_AMBULATORY_CARE_PROVIDER_SITE_OTHER): Payer: BC Managed Care – PPO | Admitting: Rheumatology

## 2021-08-31 ENCOUNTER — Ambulatory Visit (INDEPENDENT_AMBULATORY_CARE_PROVIDER_SITE_OTHER): Payer: BC Managed Care – PPO | Admitting: Psychiatry

## 2021-08-31 ENCOUNTER — Encounter: Payer: Self-pay | Admitting: Rheumatology

## 2021-08-31 ENCOUNTER — Other Ambulatory Visit: Payer: Self-pay

## 2021-08-31 ENCOUNTER — Encounter: Payer: Self-pay | Admitting: Psychiatry

## 2021-08-31 VITALS — BP 129/81 | HR 71 | Resp 12 | Ht 65.0 in | Wt 235.6 lb

## 2021-08-31 DIAGNOSIS — M19041 Primary osteoarthritis, right hand: Secondary | ICD-10-CM

## 2021-08-31 DIAGNOSIS — G43009 Migraine without aura, not intractable, without status migrainosus: Secondary | ICD-10-CM

## 2021-08-31 DIAGNOSIS — M7062 Trochanteric bursitis, left hip: Secondary | ICD-10-CM

## 2021-08-31 DIAGNOSIS — G43809 Other migraine, not intractable, without status migrainosus: Secondary | ICD-10-CM

## 2021-08-31 DIAGNOSIS — M7061 Trochanteric bursitis, right hip: Secondary | ICD-10-CM | POA: Diagnosis not present

## 2021-08-31 DIAGNOSIS — F411 Generalized anxiety disorder: Secondary | ICD-10-CM

## 2021-08-31 DIAGNOSIS — I429 Cardiomyopathy, unspecified: Secondary | ICD-10-CM

## 2021-08-31 DIAGNOSIS — F333 Major depressive disorder, recurrent, severe with psychotic symptoms: Secondary | ICD-10-CM

## 2021-08-31 DIAGNOSIS — M19071 Primary osteoarthritis, right ankle and foot: Secondary | ICD-10-CM

## 2021-08-31 DIAGNOSIS — I10 Essential (primary) hypertension: Secondary | ICD-10-CM

## 2021-08-31 DIAGNOSIS — M797 Fibromyalgia: Secondary | ICD-10-CM

## 2021-08-31 DIAGNOSIS — M3500 Sicca syndrome, unspecified: Secondary | ICD-10-CM | POA: Diagnosis not present

## 2021-08-31 DIAGNOSIS — G894 Chronic pain syndrome: Secondary | ICD-10-CM

## 2021-08-31 DIAGNOSIS — M19072 Primary osteoarthritis, left ankle and foot: Secondary | ICD-10-CM

## 2021-08-31 DIAGNOSIS — M19042 Primary osteoarthritis, left hand: Secondary | ICD-10-CM

## 2021-08-31 MED ORDER — RISPERIDONE 3 MG PO TABS: 1.5000 mg | ORAL_TABLET | Freq: Every day | ORAL | 1 refills | Status: DC

## 2021-08-31 MED ORDER — BUPROPION HCL ER (XL) 300 MG PO TB24: 300.0000 mg | ORAL_TABLET | Freq: Every day | ORAL | 1 refills | Status: DC

## 2021-08-31 NOTE — Patient Instructions (Addendum)
Please get CBC with differential, CMP with GFR and urinalysis with your next labs and forward results to [place].  Sjogren's Syndrome Sjgren's syndrome is an inflammatory disease in which the body's disease-fighting system (immune system) attacks the glands that produce tears (lacrimal glands) and the glands that produce saliva (salivary glands). This makes the eyes and mouth very dry. Sjgren's syndrome can also affect other parts of the body, causing dryness of the skin, nose, throat, and vagina. Sjgren's syndrome is a long-term (chronic) disorder that has no cure. In some cases, it is linked to other disorders (rheumatic disorders), such as rheumatoid arthritis and systemic lupus erythematosus (SLE). It may affect other parts of the body, such as the: Blood vessels. Joints. Lungs. Kidneys. Liver or pancreas. Brain, nerves, or spinal cord. What are the causes? The cause of this condition is not known. It may be passed along from parent to child (inherited), or it may be a symptom of a rheumatic disorder. What increases the risk? This condition is more likely to develop in: Women. People who are 20-34 years old and older. People who have recently had a viral infection or currently have a viral infection. What are the signs or symptoms? The main symptoms of this condition are: Dry mouth. This may include: A chalky feeling. Difficulty swallowing, speaking, or tasting. Frequent cavities in the teeth. Frequent mouth infections. Dry eyes. This may include: Burning, redness, and itching. Blurry vision. Fluctuating vision. Light sensitivity. Other symptoms may include: Dryness of the skin and the inside of the nose. Eyelid infections. Vaginal dryness (if applicable). Joint pain and stiffness. Muscle pain and stiffness. How is this diagnosed? This condition is diagnosed based on: Your symptoms. Your medical history. A physical exam of your eyes and mouth. Tests, including: A Schirmer  test. This tests your tear production. An eye exam that is done with a magnifying device (slit-lamp exam). An eye test that temporarily stains your eye with special dyes. This shows the extent of eye damage. Tests to check your salivary gland function. Biopsy. This is a removal of part of a salivary gland from inside your lower lip to be studied under a microscope. Chest X-rays. Blood or urine tests. How is this treated? There is no cure for this condition, but treatment can help you manage your symptoms. You may be asked to see a rheumatologist for further evaluation and treatment. This condition may be treated with: Medicines to help relieve pain and stiffness. Medicines to help relieve inflammation in your body (corticosteroids). These are usually for severe cases. Medicines to help reduce the activity of your immune system (immunosuppressants). These are usually prescribed by your health care provider or a rheumatologist. Moisture replacement therapies to help relieve dryness in your skin, mouth, and eyes. Dry eyes may be treated with: Eye drops or nasal sprays to improve dryness of the eyes. Surgery or insertion of plugs to close the lacrimal glands (punctal occlusion). This helps keep more natural tears in your eyes. Soft contact lenses or hard scleral lenses. These are occasionally used to protect the surface of the eye. Biologic lubricating eye drops (serum tears). These are eye drops made from a person's own blood. They are used in some people with severe dry eye. Follow these instructions at home: Eye care  Use eye drops and other medicines as told by your health care provider. Protect your eyes from the sun and wind with sunglasses or glasses. Blink at least 5-6 times a minute. Maintain properly humidified air. You  may want to use a humidifier at home and at work. Avoid smoke. Mouth care Brush your teeth and floss after every meal. Chew sugar-free gum or suck on hard candy.  This may help to relieve dry mouth. Use antimicrobial mouthwash daily. Take frequent sips of water or sugar-free drinks. Use saliva substitutes or lip balm as told by your health care provider. See your dentist every 6 months. General instructions  Take over-the-counter and prescription medicines only as told by your health care provider. Drink enough fluid to keep your urine pale yellow. Keep all follow-up visits. This is important. Contact a health care provider if: You have a fever. You have night sweats. You are always tired. You have unexplained weight loss. You develop itchy skin. You have red patches on your skin. You have a lump or swelling on your neck. Get help right away if: You develop severe eye pain. You develop sudden decreased vision. Summary Sjgren's syndrome is a disease in which the body's immune system attacks the glands that produce tears and the glands that produce saliva. This condition makes the eyes and mouth very dry. Sjgren's syndrome is a long-term (chronic) disorder. There is no cure for this condition, but treatment can help you manage your symptoms. The cause of this condition is not known. You may be asked to see a rheumatologist for further evaluation and treatment. This information is not intended to replace advice given to you by your health care provider. Make sure you discuss any questions you have with your health care provider. Document Revised: 05/10/2021 Document Reviewed: 05/10/2021 Elsevier Patient Education  [employer].

## 2021-08-31 NOTE — Progress Notes (Signed)
Brittany Sweeney [id number] 1961/08/08 60 y.o.   Subjective:   [name] is a 60 y.o. (DOB Jan 14, 1961) female.  Chief Complaint:  Chief Complaint  Patient presents with   Follow-up    HPI Brittany Sweeney presents  today for follow-up of depression and anxiety.   seen November 2020 & 02/2020.  No meds were changed.  She continued on Wellbutrin XL 300 mg in the morning, gabapentin 100 nightly, risperidone 3 mg 1/2 tablet nightly, Topamax 100 mg tablets 1-1/2 tablets daily.  09/14/20 appt with following noted: Dx. Sjogren's syndrome. Tired from working 3rd shift and otherwise Doing fine.  Ready to retire and hopes for May 2022.  If can get insurance.  Sleep is good and better with 2nd shift.   HA managed with topiramate given originally by neurology. Vaccinated. Plan: No med changes  03/15/2021 appointment with the following noted: Good with mental health.  Not usually depressed.  Going to retire May 05, 2021.  Needs to keep insurance.  Son concerned she won't stay busy enough. Doesn't like her job bc it's hot and hard on body. HA managed Patient reports stable mood and denies depressed or irritable moods. A little restless.  Patient denies any recent difficulty with anxiety.  Patient denies difficulty usually with sleep initiation or maintenance. 8-9 hours.  Denies appetite disturbance.  Patient reports that energy and motivation have been good.  Patient denies any difficulty with concentration.  Patient denies any suicidal ideation.  No fear.  No voices.  08/31/21 appt noted: She continued on Wellbutrin XL 300 mg in the morning, , risperidone 3 mg 1/2 tablet nightly, Topamax 100 mg tablets 1 tablets daily.  Stopped gabapentin 100 nightly No SE Good.  Retired and a tiny bit of anxiety at night.   Patient reports stable mood and denies depressed or irritable moods.  Patient denies any recent difficulty with anxiety.  Patient denies difficulty with sleep initiation or  maintenance. Denies appetite disturbance.  Patient reports that energy and motivation have been good.  Patient denies any difficulty with concentration.  Patient denies any suicidal ideation. Sleep not as good bc sleeping longer and not working as hard.  A little RLS  Plans to retire at 60 yo.  Past Psychiatric Medication Trials: Sertraline cognitive side effects,  Wellbutrin 300,  risperidone 1.5,  gabapentin History of psychiatric hospitalization with depression, suicidal thoughts, and paranoia..  Long history of recurrent depression and paranoia  Review of Systems:  Review of Systems  Constitutional:  Positive for fatigue.  Cardiovascular:  Negative for chest pain and palpitations.  Skin:        Skin crawling still some but less and all over but mostly trunk.  Neurological:  Negative for tremors and weakness.  Psychiatric/Behavioral:  Negative for agitation, behavioral problems, confusion, decreased concentration, dysphoric mood, hallucinations, self-injury, sleep disturbance and suicidal ideas. The patient is not nervous/anxious and is not hyperactive.    Medications: I have reviewed the patient's current medications.  Current Outpatient Medications  Medication Sig Dispense Refill   buPROPion (WELLBUTRIN XL) 300 MG 24 hr tablet Take 1 tablet (300 mg total) by mouth daily. 90 tablet 1   carvedilol (COREG) 6.25 MG tablet TAKE 1 TABLET (6.25 MG TOTAL) BY MOUTH 2 (TWO) TIMES DAILY WITH A MEAL. 180 tablet 3   nystatin (MYCOSTATIN/NYSTOP) powder Apply topically 2 (two) times daily.     nystatin-triamcinolone ointment (MYCOLOG) Apply 1 application topically 2 (two) times daily. 30 g 11  risperiDONE (RISPERDAL) 3 MG tablet Take 0.5 tablets (1.5 mg total) by mouth daily. 45 tablet 1   topiramate (TOPAMAX) 100 MG tablet Take 1 tablet (100 mg total) by mouth daily. 90 tablet 3   busPIRone (BUSPAR) 15 MG tablet Take by mouth. (Patient not taking: Reported on 08/31/2021)     No current  facility-administered medications for this visit.    Medication Side Effects: None  Allergies:  Allergies  Allergen Reactions   Penicillins Rash   Sulfonamide Derivatives Hives    Past Medical History:  Diagnosis Date   Cardiomyopathy    Depression    Fibromyalgia    Hypertension     Family History  Problem Relation Age of Onset   Hypertension Mother     Social History   Socioeconomic History   Marital status: Married    Spouse name: Not on file   Number of children: Not on file   Years of education: Not on file   Highest education level: Not on file  Occupational History   Not on file  Tobacco Use   Smoking status: Never   Smokeless tobacco: Never  Vaping Use   Vaping Use: Never used  Substance and Sexual Activity   Alcohol use: No    Alcohol/week: 0.0 standard drinks   Drug use: Never   Sexual activity: Yes    Birth control/protection: Surgical  Other Topics Concern   Not on file  Social History Narrative   Not on file   Social Determinants of Health   Financial Resource Strain: Not on file  Food Insecurity: Not on file  Transportation Needs: Not on file  Physical Activity: Not on file  Stress: Not on file  Social Connections: Not on file  Intimate Partner Violence: Not on file    Past Medical History, Surgical history, Social history, and Family history were reviewed and updated as appropriate.   Please see review of systems for further details on the patient's review from today.   Objective:   Physical Exam:  There were no vitals taken for this visit.  Physical Exam Constitutional:      General: She is not in acute distress. Musculoskeletal:        General: No deformity.  Neurological:     Mental Status: She is alert and oriented to person, place, and time.     Cranial Nerves: No dysarthria.     Coordination: Coordination normal.  Psychiatric:        Attention and Perception: Attention and perception normal. She does not perceive  auditory or visual hallucinations.        Mood and Affect: Mood is anxious. Mood is not depressed. Affect is not labile, blunt, angry or inappropriate.        Speech: Speech normal.        Behavior: Behavior is not slowed. Behavior is cooperative.        Thought Content: Thought content normal. Thought content is not paranoid or delusional. Thought content does not include homicidal or suicidal ideation. Thought content does not include homicidal or suicidal plan.        Cognition and Memory: Cognition and memory normal.        Judgment: Judgment normal.     Comments: No concerns about meds but eventually wants to stop when retires. Fair insight. Mild anxiety    Lab Review:     Component Value Date/Time   NA 140 04/30/2012 0613   K 3.9 04/30/2012 0613   CL 105  04/30/2012 0613   CO2 27 04/30/2012 0613   GLUCOSE 90 04/30/2012 0613   BUN 15 04/30/2012 0613   CREATININE 0.79 04/30/2012 0613   CALCIUM 9.4 04/30/2012 0613   PROT 7.0 04/27/2012 1930   ALBUMIN 3.6 04/27/2012 1930   AST 13 04/27/2012 1930   ALT 14 04/27/2012 1930   ALKPHOS 70 04/27/2012 1930   BILITOT 0.1 (L) 04/27/2012 1930   GFRNONAA >90 04/30/2012 0613   GFRAA >90 04/30/2012 0613       Component Value Date/Time   WBC 5.4 04/23/2012 2131   RBC 4.54 04/23/2012 2131   HGB 14.2 04/23/2012 2131   HCT 40.0 04/23/2012 2131   PLT 229 04/23/2012 2131   MCV 88.1 04/23/2012 2131   MCH 31.3 04/23/2012 2131   MCHC 35.5 04/23/2012 2131   RDW 12.4 04/23/2012 2131   LYMPHSABS 2.1 04/23/2012 2131   MONOABS 0.4 04/23/2012 2131   EOSABS 0.1 04/23/2012 2131   BASOSABS 0.0 04/23/2012 2131    No results found for: POCLITH, LITHIUM   No results found for: PHENYTOIN, PHENOBARB, VALPROATE, CBMZ   .res Assessment: Plan:    Severe recurrent major depression with psychotic features ([place])  Generalized anxiety disorder  Migraine without aura and without status migrainosus, not intractable   SX in remission with the meds  and she has no med complaints.  She's pleased and satisfied.  History of relapse with attempts to decrease the Risperidone.  Therefore need to continue the current dosage.  She wants to consider coming off some of the medication but it would be risky to do so.  She agrees to defer med changes but wants to consider it later. Retired May 05, 2021.  Discussed potential metabolic side effects associated with atypical antipsychotics, as well as potential risk for movement side effects. Advised pt to contact office if movement side effects occur.  NoAIM  HA ok with reduced topiramate to 100 mg .  Disc importance of staying active in retirement.  Some concerns her plans seem inadequate and son agrees. Disc relapse prevention.  No med changes today She continued on Wellbutrin XL 300 mg in the morning, , risperidone 3 mg 1/2 tablet nightly, Topamax 100 mg tablets 1 tablets daily.  FU 6 mos  Lynder Parents, MD, DFAPA   Please see After Visit Summary for patient specific instructions.  Future Appointments  Date Time Provider [place]  03/01/2022  1:00 PM Ofilia Neas, PA-C [facility] None    No orders of the defined types were placed in this encounter.     -------------------------------

## 2021-10-11 DIAGNOSIS — Z131 Encounter for screening for diabetes mellitus: Secondary | ICD-10-CM | POA: Diagnosis not present

## 2021-10-11 DIAGNOSIS — M797 Fibromyalgia: Secondary | ICD-10-CM | POA: Diagnosis not present

## 2021-10-11 DIAGNOSIS — Z13220 Encounter for screening for lipoid disorders: Secondary | ICD-10-CM | POA: Diagnosis not present

## 2021-10-11 DIAGNOSIS — I10 Essential (primary) hypertension: Secondary | ICD-10-CM | POA: Diagnosis not present

## 2021-10-13 DIAGNOSIS — I10 Essential (primary) hypertension: Secondary | ICD-10-CM | POA: Diagnosis not present

## 2021-10-13 DIAGNOSIS — M3500 Sicca syndrome, unspecified: Secondary | ICD-10-CM | POA: Diagnosis not present

## 2021-10-13 DIAGNOSIS — I509 Heart failure, unspecified: Secondary | ICD-10-CM | POA: Diagnosis not present

## 2021-10-13 DIAGNOSIS — M797 Fibromyalgia: Secondary | ICD-10-CM | POA: Diagnosis not present

## 2021-10-13 DIAGNOSIS — Z0001 Encounter for general adult medical examination with abnormal findings: Secondary | ICD-10-CM | POA: Diagnosis not present

## 2021-10-15 ENCOUNTER — Ambulatory Visit: Payer: BC Managed Care – PPO | Admitting: Obstetrics & Gynecology

## 2021-11-24 DIAGNOSIS — R7989 Other specified abnormal findings of blood chemistry: Secondary | ICD-10-CM | POA: Diagnosis not present

## 2021-11-24 DIAGNOSIS — Z1329 Encounter for screening for other suspected endocrine disorder: Secondary | ICD-10-CM | POA: Diagnosis not present

## 2022-01-13 DIAGNOSIS — M797 Fibromyalgia: Secondary | ICD-10-CM | POA: Diagnosis not present

## 2022-01-13 DIAGNOSIS — I509 Heart failure, unspecified: Secondary | ICD-10-CM | POA: Diagnosis not present

## 2022-01-13 DIAGNOSIS — M3500 Sicca syndrome, unspecified: Secondary | ICD-10-CM | POA: Diagnosis not present

## 2022-01-13 DIAGNOSIS — I10 Essential (primary) hypertension: Secondary | ICD-10-CM | POA: Diagnosis not present

## 2022-02-04 ENCOUNTER — Telehealth: Payer: Self-pay | Admitting: Internal Medicine

## 2022-02-04 NOTE — Telephone Encounter (Signed)
?  Pt c/o of Chest Pain: STAT if CP now or developed within 24 hours ? ?1. Are you having CP right now? No  ? ?2. Are you experiencing any other symptoms (ex. SOB, nausea, vomiting, sweating)? None  ? ?3. How long have you been experiencing CP? Yesterday  ? ?4. Is your CP continuous or coming and going? coming and going ? ?5. Have you taken Nitroglycerin? No  ? ?Pt said, yesterday she felt a chest discomfort that comes and goes. She said, she doesn't have any other symptoms  ??  ?

## 2022-02-04 NOTE — Telephone Encounter (Signed)
Spoke with the patient who reports that she has having a dull ache in her chest that comes and goes. She is not currently having any chest pain. She denies any other associated symptoms such as SOB, N/V, diaphoresis, or dizziness. Pain does not radiate. She states that is occurred yesterday and this morning but resolved on its own. She does not associated pain with any exertion or after eating. States is occurs at random. She also reports that at night time sometimes her arms will go numb. She is not sure if she is sleeping on them irregularly. Advised patient that I would discuss with Dr. Harrington Challenger about need for appointment or further testing and we would let her know. Patient verbalized understanding.  ?

## 2022-02-06 NOTE — Telephone Encounter (Signed)
The fact that symptoms are erratic, not triggered by activity makes them less apt to be angina, or problem with blood fow ?I saw her back in June   Please make appt for her this spring with me or APP ?

## 2022-02-07 NOTE — Telephone Encounter (Signed)
I spoke with the pt and she has had much improvement over the weekend.,.. I made her a follow up appt with Dr. Harrington Challenger 02/15/22 but she will call back sooner if her symptoms return and or worsen.  ?

## 2022-02-14 DIAGNOSIS — Z1231 Encounter for screening mammogram for malignant neoplasm of breast: Secondary | ICD-10-CM | POA: Diagnosis not present

## 2022-02-14 NOTE — Progress Notes (Signed)
? ?Cardiology Office Note ? ? ?Date:  02/14/2022  ? ?ID:  Brittany Sweeney, DOB 1961/03/03, MRN [id number] ? ?PCP:  Funkley Nation, MD  ?Cardiologist:   Dorris Carnes, MD  ? ?Pt with hx of cardimyopathy  Presents for eval of CP   ?  ?History of Present Illness: ?Brittany Sweeney is a 61 y.o. female with hx of fibromyalgia, HTN and cardiomyopathy who hs been seen in our clinic before   She was seen in early  2000s by T Wall for cardiomyopathy  LVEF ws reduced in 2001   It then normalized  Last echo 2005. ? ?The pt says her breathing is OK  She denies CP  No PND  No orthopnea  No LE edema   She does not check her BP at home   ? ?Note she was seen by Dr Jimmye Norman  In Jan 2022    BP 140/     ?He ordered an echocardiogram   This showed LVEF was normal sized and thickness; LVEf was normal  There was gr I diastolic dysfunction; LA was moderately dilated  ? ?I saw the pt in June 2022 ? ?The pt called in on 02/06/22   Complaining of CP  She describes it as a dull sensation  Comes and goes    Random occurrence    ?Since she called in she has had no furtherspells  ? ?Pt denies other chest pains    No SOB   Walks 58mles per day    ? ?Hands numb when she wakes up  ? ?Br   Blueberry yogurt and berries ?[employer]  Sandwich     Diet [place] ?Dinner   [place] ? ?No outpatient medications have been marked as taking for the 02/15/22 encounter (Appointment) with RFay Records MD.  ? ? ? ?Allergies:   Penicillins and Sulfonamide derivatives  ? ?Past Medical History:  ?Diagnosis Date  ? Cardiomyopathy   ? Depression   ? Fibromyalgia   ? Hypertension   ? ? ?Past Surgical History:  ?Procedure Laterality Date  ? ABDOMINAL HYSTERECTOMY    ? ? ? ?Social History:  The patient  reports that she has never smoked. She has never used smokeless tobacco. She reports that she does not drink alcohol and does not use drugs.  ? ?Family History:  The patient's family history includes Hypertension in her mother.  ? ? ?ROS:  Please see the history of present  illness. All other systems are reviewed and  Negative to the above problem except as noted.  ? ? ?PHYSICAL EXAM: ?VS:  There were no vitals taken for this visit.  ?GEN: Obese 61yo  in no acute distress  ?HEENT: normal  ?Neck: no JVD, carotid bruits, ?Cardiac: RRR; no murmurs,  No LE edema  ?Respiratory:  clear to auscultation bilaterally, ?GI: soft, nontender, nondistended, + BS  No hepatomegaly  ?MS: no deformity Moving all extremities   ?Skin: warm and dry, no rash ?Neuro:  Strength and sensation are intact ?Psych: euthymic mood, full affect ? ? ?EKG:  EKG is ordered today.  SR 65bpm   Low voltage EKG   ? ?Echo Jan 2022 ? ? ? ?Summary  ?  1. The left ventricle is normal in size with upper normal wall thickness.  ?  2. The left ventricular systolic function is normal, LVEF is visually  ?estimated at 55-60%.  ?  3. There is grade I diastolic dysfunction (impaired relaxation).  ?  4. There  is mild mitral valve regurgitation.  ?  5. The left atrium is moderately dilated in size.  ?  6. The right ventricle is upper normal in size, with normal systolic  ?function.  ?Lipid Panel ?   ?Component Value Date/Time  ? CHOL 181 07/02/2009 0000  ? TRIG 207 07/02/2009 0000  ? HDL 54 07/02/2009 0000  ? Thomas 86 07/02/2009 0000  ? ?  ? ?Wt Readings from Last 3 Encounters:  ?08/31/21 235 lb 9.6 oz (106.9 kg)  ?03/31/21 227 lb (103 kg)  ?02/22/21 231 lb 6.4 oz (105 kg)  ?  ? ? ?ASSESSMENT AND PLAN: ? ? ?1  CP   Atypical   I do not think it is cardiac in orign ? ?2   Hx cardiomyopathy  Remote   LV function recovered   Recent echo confirms thsi     Has remained  on Coreg    I would continue  ? ?3  HTN  BP Overall has been good at home   110s, 120s/     ? ?4  Lipids   Last lipds in 2022  LDL 84  HDL 47   ? ?5  Obesity  Discussed diet  Cut back on carbs/sugar   Increase veggies, whole /nonprocessed foods   ?Pt joined gym   ? ? 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 ?Current medicines are reviewed  at length with the patient today.  The patient does not have concerns regarding medicines. ? ?Signed, ?Dorris Carnes, MD  ?02/14/2022 9:06 PM    ?[facility] ?[address], [place], [place]  [id number] ?Phone: [phone]; Fax: [phone]  ? ?

## 2022-02-15 ENCOUNTER — Encounter: Payer: Self-pay | Admitting: Internal Medicine

## 2022-02-15 ENCOUNTER — Ambulatory Visit: Payer: BC Managed Care – PPO | Admitting: Internal Medicine

## 2022-02-15 VITALS — BP 134/88 | HR 71 | Ht 65.0 in | Wt 240.6 lb

## 2022-02-15 DIAGNOSIS — I10 Essential (primary) hypertension: Secondary | ICD-10-CM | POA: Diagnosis not present

## 2022-02-15 NOTE — Patient Instructions (Signed)
Medication Instructions:  ? ?*If you need a refill on your cardiac medications before your next appointment, please call your pharmacy* ? ? ?Lab Work: ? ?If you have labs (blood work) drawn today and your tests are completely normal, you will receive your results only by: ?MyChart Message (if you have MyChart) OR ?A paper copy in the mail ?If you have any lab test that is abnormal or we need to change your treatment, we will call you to review the results. ? ? ?Testing/Procedures: ? ? ? ?Follow-Up: ?At [facility], you and your health needs are our priority.  As part of our continuing mission to provide you with exceptional heart care, we have created designated Provider Care Teams.  These Care Teams include your primary Cardiologist (physician) and Advanced Practice Providers (APPs -  Physician Assistants and Nurse Practitioners) who all work together to provide you with the care you need, when you need it. ? ?We recommend signing up for the patient portal called "MyChart".  Sign up information is provided on this After Visit Summary.  MyChart is used to connect with patients for Virtual Visits (Telemedicine).  Patients are able to view lab/test results, encounter notes, upcoming appointments, etc.  Non-urgent messages can be sent to your provider as well.   ?To learn more about what you can do with MyChart, go to [email or web address].   ? ?Your next appointment:   ?1 year(s) ? ?The format for your next appointment:   ?In Person ? ?Dr Dorris Carnes in [place] ? ?If primary card or EP is not listed click here to update    :1}  ? ? ?Other Instructions ? ? ?Important Information About Sugar ? ? ? ? ?  ?

## 2022-02-15 NOTE — Progress Notes (Signed)
? ?Office Visit Note ? ?Patient: Brittany Sweeney             ?Date of Birth: [birth year]           ?MRN: [id number]             ?PCP: [place] Nation, MD ?Referring: [place] Nation, MD ?Visit Date: 03/01/2022 ?Occupation: _0 @ ? ?Subjective:  ?Routine follow-up ? ?History of Present Illness: Brittany Sweeney is a 61 y.o. female with history of sjogren's syndrome, osteoarthritis, and fibromyalgia.  Patient continues to have chronic sicca symptoms which have been tolerable overall.  She is been seeing the ophthalmologist on a yearly basis and the dentist every 6 months.  She has been using Systane eyedrops as needed for eye dryness and has not needed to use any over-the-counter products for mouth dryness since her symptoms have been mild.  She denies any swollen lymph nodes.  She denies any increased joint pain or joint stiffness. ?She continues to see her cardiologist on a yearly basis.  She denies any increased shortness of breath or coughing. ? ? ? ?Activities of Daily Living:  ?Patient reports morning stiffness for 5 minutes.   ?Patient Reports nocturnal pain.  ?Difficulty dressing/grooming: Denies ?Difficulty climbing stairs: Denies ?Difficulty getting out of chair: Denies ?Difficulty using hands for taps, buttons, cutlery, and/or writing: Denies ? ?Review of Systems  ?Constitutional:  Negative for fatigue.  ?HENT:  Positive for mouth sores.   ?Eyes:  Negative for dryness.  ?Respiratory:  Negative for shortness of breath.   ?Cardiovascular:  Negative for swelling in legs/feet.  ?Gastrointestinal:  Negative for constipation.  ?Endocrine: Positive for cold intolerance and excessive thirst.  ?Genitourinary:  Negative for difficulty urinating.  ?Musculoskeletal:  Positive for joint pain, joint pain, morning stiffness and muscle tenderness.  ?Skin:  Negative for rash.  ?Allergic/Immunologic: Negative for susceptible to infections.  ?Neurological:  Positive for numbness.  ?Hematological:  Positive for  bruising/bleeding tendency.  ?Psychiatric/Behavioral:  Positive for sleep disturbance.   ? ?PMFS History:  ?Patient Active Problem List  ? Diagnosis Date Noted  ? Sjogren's syndrome ([place]) 09/22/2016  ? Fibromyalgia 09/22/2016  ? Chronic pain syndrome 09/22/2016  ? Osteoarthritis of both hands 09/22/2016  ? Migraine 09/22/2016  ? Schizophrenia, paranoid type ([place]) 04/25/2012  ? DEPRESSION 10/21/2010  ? OVERWEIGHT/OBESITY 12/18/2009  ? Hypertension 12/18/2009  ? 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  ?Past Medical History:  ?Diagnosis Date  ? Cardiomyopathy   ? Depression   ? Fibromyalgia   ? Hypertension   ? Osteoarthritis   ? Sjogren's syndrome ([place])   ?  ?Family History  ?Problem Relation Age of Onset  ? Hypertension Mother   ? ?Past Surgical History:  ?Procedure Laterality Date  ? ABDOMINAL HYSTERECTOMY    ? ?Social History  ? ?Social History Narrative  ? Not on file  ? ?Immunization History  ?Administered Date(s) Administered  ? Janssen (J&J) SARS-COV-2 Vaccination 03/14/2020  ?  ? ?Objective: ?Vital Signs: BP 127/74 (BP Location: Left Arm, Patient Position: Sitting, Cuff Size: Normal)   Pulse 90   Resp 16   Ht _1  (1.651 m)   Wt 243 lb (110.2 kg)   BMI 40.44 kg/m?   ? ?Physical Exam ?Vitals and nursing note reviewed.  ?Constitutional:   ?   Appearance: She is well-developed.  ?HENT:  ?   Head: Normocephalic and atraumatic.  ?Eyes:  ?   Conjunctiva/sclera: Conjunctivae normal.  ?Cardiovascular:  ?   Rate and Rhythm:  Normal rate and regular rhythm.  ?   Heart sounds: Normal heart sounds.  ?Pulmonary:  ?   Effort: Pulmonary effort is normal.  ?   Breath sounds: Normal breath sounds.  ?Abdominal:  ?   General: Bowel sounds are normal.  ?   Palpations: Abdomen is soft.  ?Musculoskeletal:  ?   Cervical back: Normal range of motion.  ?Skin: ?   General: Skin is warm and dry.  ?   Capillary Refill: Capillary refill takes less than 2 seconds.  ?Neurological:  ?   Mental Status: She is alert and oriented to  person, place, and time.  ?Psychiatric:     ?   Behavior: Behavior normal.  ?  ? ?Musculoskeletal Exam: C-spine is slightly limited range of motion with lateral rotation.  No midline spinal tenderness or SI joint tenderness.  Shoulder joints, elbow joints, wrist joints, MCPs, PIPs, DIPs have good range of motion with no synovitis.  PIP and DIP thickening consistent with osteoarthritis of both hands.  Complete fist formation bilaterally.  Hip joints have limited range of motion bilaterally.  Tenderness ovation over bilateral trochanteric bursa.  Knee joints have good range of motion with no warmth or effusion.  Ankle joints have good range of motion with no joint tenderness.  Pitting edema noted bilaterally.  No tenderness over MTP joints. ? ?CDAI Exam: ?CDAI Score: -- ?Patient Global: --; Provider Global: -- ?Swollen: --; Tender: -- ?Joint Exam 03/01/2022  ? ?No joint exam has been documented for this visit  ? ?There is currently no information documented on the homunculus. Go to the Rheumatology activity and complete the homunculus joint exam. ? ?Investigation: ?No additional findings. ? ?Imaging: ?No results found. ? ?Recent Labs: ?Lab Results  ?Component Value Date  ? WBC 5.4 04/23/2012  ? HGB 14.2 04/23/2012  ? PLT 229 04/23/2012  ? NA 140 04/30/2012  ? K 3.9 04/30/2012  ? CL 105 04/30/2012  ? CO2 27 04/30/2012  ? GLUCOSE 90 04/30/2012  ? BUN 15 04/30/2012  ? CREATININE 0.79 04/30/2012  ? BILITOT 0.1 (L) 04/27/2012  ? ALKPHOS 70 04/27/2012  ? AST 13 04/27/2012  ? ALT 14 04/27/2012  ? PROT 7.0 04/27/2012  ? ALBUMIN 3.6 04/27/2012  ? CALCIUM 9.4 04/30/2012  ? GFRAA >90 04/30/2012  ? ? ?Speciality Comments: No specialty comments available. ? ?Procedures:  ?No procedures performed ?Allergies: Penicillins and Sulfonamide derivatives  ? ?Assessment / Plan:     ?Visit Diagnoses: Sjogren's syndrome with other organ involvement ([place]) - Positive ANA, positive Ro:  ?She continues to have chronic sicca symptoms which have  been mild.  She uses Systane eyedrops as needed for dry eyes and has not needed any over-the-counter products for dry mouth since her symptoms have been mild.  She has been seeing the ophthalmologist on a yearly basis and the dentist every 6 months as recommended. ?Discussed the increased risk for lymphoma in patients with Sjogren's disease.  I recommended updating SPEP, RF, and ESR today but the patient has declined.  I also discussed the increased risk for developing inflammatory arthritis, ILD, and neuropathy.  The patient does not have any clinical features of inflammatory arthritis at this time.  No synovitis was noted.  She is not experiencing any new or worsening pulmonary symptoms and her lungs were clear to auscultation on examination today.  She sees her cardiologist on a yearly basis. ?Discussed the importance of routine lab monitoring but the patient declined to have updated labs today.  She would like to have lab work done with her PCP.  She was given orders for the following lab work to be checked and we will discuss the results at her follow-up visit.  Recommended follow-up in 6 months but the patient requested to follow-up in 9 months.  She was advised to notify us if she develops any new or worsening symptoms prior to her follow-up visit. ? - Plan: CBC with Differential/Platelet, Urinalysis, Routine w reflex microscopic, COMPLETE METABOLIC PANEL WITH GFR, ANA, C3 and C4, Sedimentation rate, IgG, IgA, IgM, Sjogrens syndrome-B extractable nuclear antibody, Sjogrens syndrome-A extractable nuclear antibody, Rheumatoid factor ? ?Primary osteoarthritis of both hands: She has PIP and DIP thickening consistent with osteoarthritis of both hands.  No tenderness or inflammation was noted.  Complete fist formation bilaterally. ? ?Trochanteric bursitis of both hips: She has limited range of motion of both hip joints with no groin pain.  Some tenderness palpation over trochanteric bursa bilaterally. ? ?Primary  osteoarthritis of both feet: She is not experiencing any increased discomfort in her feet at this time.  Pitting edema noted bilaterally. ? ?Fibromyalgia -Overall her fibromyalgia has been well controlled.  She rema

## 2022-02-24 ENCOUNTER — Other Ambulatory Visit: Payer: Self-pay | Admitting: Psychiatry

## 2022-02-24 DIAGNOSIS — F333 Major depressive disorder, recurrent, severe with psychotic symptoms: Secondary | ICD-10-CM

## 2022-02-24 DIAGNOSIS — F411 Generalized anxiety disorder: Secondary | ICD-10-CM

## 2022-03-01 ENCOUNTER — Encounter: Payer: Self-pay | Admitting: Physician Assistant

## 2022-03-01 ENCOUNTER — Ambulatory Visit (INDEPENDENT_AMBULATORY_CARE_PROVIDER_SITE_OTHER): Payer: BC Managed Care – PPO | Admitting: Psychiatry

## 2022-03-01 ENCOUNTER — Encounter: Payer: Self-pay | Admitting: Psychiatry

## 2022-03-01 ENCOUNTER — Ambulatory Visit: Payer: BC Managed Care – PPO | Admitting: Physician Assistant

## 2022-03-01 VITALS — BP 127/74 | HR 90 | Resp 16 | Ht 65.0 in | Wt 243.0 lb

## 2022-03-01 DIAGNOSIS — M19041 Primary osteoarthritis, right hand: Secondary | ICD-10-CM

## 2022-03-01 DIAGNOSIS — M3509 Sicca syndrome with other organ involvement: Secondary | ICD-10-CM

## 2022-03-01 DIAGNOSIS — M7061 Trochanteric bursitis, right hip: Secondary | ICD-10-CM

## 2022-03-01 DIAGNOSIS — M797 Fibromyalgia: Secondary | ICD-10-CM

## 2022-03-01 DIAGNOSIS — M7062 Trochanteric bursitis, left hip: Secondary | ICD-10-CM

## 2022-03-01 DIAGNOSIS — M19072 Primary osteoarthritis, left ankle and foot: Secondary | ICD-10-CM

## 2022-03-01 DIAGNOSIS — F333 Major depressive disorder, recurrent, severe with psychotic symptoms: Secondary | ICD-10-CM | POA: Diagnosis not present

## 2022-03-01 DIAGNOSIS — G43009 Migraine without aura, not intractable, without status migrainosus: Secondary | ICD-10-CM | POA: Diagnosis not present

## 2022-03-01 DIAGNOSIS — G43809 Other migraine, not intractable, without status migrainosus: Secondary | ICD-10-CM

## 2022-03-01 DIAGNOSIS — F411 Generalized anxiety disorder: Secondary | ICD-10-CM | POA: Diagnosis not present

## 2022-03-01 DIAGNOSIS — M19071 Primary osteoarthritis, right ankle and foot: Secondary | ICD-10-CM

## 2022-03-01 DIAGNOSIS — G894 Chronic pain syndrome: Secondary | ICD-10-CM

## 2022-03-01 DIAGNOSIS — I10 Essential (primary) hypertension: Secondary | ICD-10-CM

## 2022-03-01 DIAGNOSIS — M19042 Primary osteoarthritis, left hand: Secondary | ICD-10-CM

## 2022-03-01 DIAGNOSIS — I429 Cardiomyopathy, unspecified: Secondary | ICD-10-CM

## 2022-03-01 DIAGNOSIS — M3500 Sicca syndrome, unspecified: Secondary | ICD-10-CM

## 2022-03-01 MED ORDER — RISPERIDONE 3 MG PO TABS: 1.5000 mg | ORAL_TABLET | Freq: Every day | ORAL | 2 refills | Status: DC

## 2022-03-01 MED ORDER — TOPIRAMATE 100 MG PO TABS: 100.0000 mg | ORAL_TABLET | Freq: Every day | ORAL | 3 refills | Status: DC

## 2022-03-01 MED ORDER — BUPROPION HCL ER (XL) 300 MG PO TB24: 300.0000 mg | ORAL_TABLET | Freq: Every day | ORAL | 2 refills | Status: DC

## 2022-03-01 NOTE — Progress Notes (Signed)
Brittany Sweeney ?[id number] ?[birth year] ?61 y.o. ? ? ?Subjective:  ? ?[name] is a 61 y.o. (DOB 11-06-1960) female. ? ?Chief Complaint:  ?Chief Complaint  ?Patient presents with  ? Follow-up  ? ? ?HPI ?Brittany Sweeney presents  today for follow-up of depression and anxiety.  ? ?seen November 2020 & 02/2020.  No meds were changed.  She continued on Wellbutrin XL 300 mg in the morning, gabapentin 100 nightly, risperidone 3 mg 1/2 tablet nightly, Topamax 100 mg tablets 1-1/2 tablets daily. ? ?09/14/20 appt with following noted: ?Dx. Sjogren's syndrome. ?Tired from working 3rd shift and otherwise Doing fine.  Ready to retire and hopes for May 2022.  If can get insurance.  Sleep is good and better with 2nd shift.   ?HA managed with topiramate given originally by neurology. ?Vaccinated. ?Plan: No med changes ? ?03/15/2021 appointment with the following noted: ?Good with mental health.  Not usually depressed.  Going to retire May 05, 2021.  Needs to keep insurance.  Son concerned she won't stay busy enough. Doesn't like her job bc it's hot and hard on body. HA managed ?Patient reports stable mood and denies depressed or irritable moods. A little restless.  Patient denies any recent difficulty with anxiety.  Patient denies difficulty usually with sleep initiation or maintenance. 8-9 hours.  Denies appetite disturbance.  Patient reports that energy and motivation have been good.  Patient denies any difficulty with concentration.  Patient denies any suicidal ideation.  No fear.  No voices. ? ?08/31/21 appt noted: ?She continued on Wellbutrin XL 300 mg in the morning, , risperidone 3 mg 1/2 tablet nightly, Topamax 100 mg tablets 1 tablets daily.  Stopped gabapentin 100 nightly ?No SE ?Good.  Retired and a tiny bit of anxiety at night.   ?Patient reports stable mood and denies depressed or irritable moods.  Patient denies any recent difficulty with anxiety.  Patient denies difficulty with sleep initiation or  maintenance. Denies appetite disturbance.  Patient reports that energy and motivation have been good.  Patient denies any difficulty with concentration.  Patient denies any suicidal ideation. ?Sleep not as good bc sleeping longer and not working as hard.  A little RLS ? ?03/01/22 appt noted:  good except not sleeping as good. ?Still gets 9 hours.   But not as solid.  Retired in July and nice. ?Gets a little bored.  Joined fitness and walks 2 miles daily. ?No SE with meds. ?No fear or paranoia or mood swings.   ?Patient reports stable mood and denies depressed or irritable moods.  Patient denies any recent difficulty with anxiety.  Denies appetite disturbance.  Patient reports that energy and motivation have been good.  Patient denies any difficulty with concentration.  Patient denies any suicidal ideation. ? ? ?Past Psychiatric Medication Trials: Sertraline cognitive side effects,  Wellbutrin 300,  ?risperidone 1.5,  ?gabapentin ?History of psychiatric hospitalization with depression, suicidal thoughts, and paranoia..  Long history of recurrent depression and paranoia ? ?Review of Systems:  ?Review of Systems  ?Constitutional:  Negative for fatigue.  ?Cardiovascular:  Negative for chest pain and palpitations.  ?Skin:   ?     Skin crawling still some but less and all over but mostly trunk.  ?Neurological:  Negative for tremors and weakness.  ?Psychiatric/Behavioral:  Negative for agitation, behavioral problems, confusion, decreased concentration, dysphoric mood, hallucinations, self-injury, sleep disturbance and suicidal ideas. The patient is not nervous/anxious and is not hyperactive.   ? ?Medications: I  have reviewed the patient's current medications. ? ?Current Outpatient Medications  ?Medication Sig Dispense Refill  ? carvedilol (COREG) 6.25 MG tablet TAKE 1 TABLET (6.25 MG TOTAL) BY MOUTH 2 (TWO) TIMES DAILY WITH A MEAL. 180 tablet 3  ? nystatin (MYCOSTATIN/NYSTOP) powder Apply topically 2 (two) times daily.    ?  nystatin-triamcinolone ointment (MYCOLOG) Apply 1 application topically 2 (two) times daily. 30 g 11  ? buPROPion (WELLBUTRIN XL) 300 MG 24 hr tablet Take 1 tablet (300 mg total) by mouth daily. 90 tablet 2  ? risperiDONE (RISPERDAL) 3 MG tablet Take 0.5 tablets (1.5 mg total) by mouth daily. 45 tablet 2  ? topiramate (TOPAMAX) 100 MG tablet Take 1 tablet (100 mg total) by mouth daily. 90 tablet 3  ? ?No current facility-administered medications for this visit.  ? ? ?Medication Side Effects: None ? ?Allergies:  ?Allergies  ?Allergen Reactions  ? Penicillins Rash  ? Sulfonamide Derivatives Hives  ? ? ?Past Medical History:  ?Diagnosis Date  ? Cardiomyopathy   ? Depression   ? Fibromyalgia   ? Hypertension   ? ? ?Family History  ?Problem Relation Age of Onset  ? Hypertension Mother   ? ? ?Social History  ? ?Socioeconomic History  ? Marital status: Married  ?  Spouse name: Not on file  ? Number of children: Not on file  ? Years of education: Not on file  ? Highest education level: Not on file  ?Occupational History  ? Not on file  ?Tobacco Use  ? Smoking status: Never  ? Smokeless tobacco: Never  ?Vaping Use  ? Vaping Use: Never used  ?Substance and Sexual Activity  ? Alcohol use: No  ?  Alcohol/week: 0.0 standard drinks  ? Drug use: Never  ? Sexual activity: Yes  ?  Birth control/protection: Surgical  ?Other Topics Concern  ? Not on file  ?Social History Narrative  ? Not on file  ? ?Social Determinants of Health  ? ?Financial Resource Strain: Not on file  ?Food Insecurity: Not on file  ?Transportation Needs: Not on file  ?Physical Activity: Not on file  ?Stress: Not on file  ?Social Connections: Not on file  ?Intimate Partner Violence: Not on file  ? ? ?Past Medical History, Surgical history, Social history, and Family history were reviewed and updated as appropriate.  ? ?Please see review of systems for further details on the patient's review from today.  ? ?Objective:  ? ?Physical Exam:  ?There were no vitals taken  for this visit. ? ?Physical Exam ?Constitutional:   ?   General: She is not in acute distress. ?Musculoskeletal:     ?   General: No deformity.  ?Neurological:  ?   Mental Status: She is alert and oriented to person, place, and time.  ?   Cranial Nerves: No dysarthria.  ?   Coordination: Coordination normal.  ?Psychiatric:     ?   Attention and Perception: Attention and perception normal. She does not perceive auditory or visual hallucinations.     ?   Mood and Affect: Mood is not anxious or depressed. Affect is not labile, blunt, angry or inappropriate.     ?   Speech: Speech normal.     ?   Behavior: Behavior is not slowed. Behavior is cooperative.     ?   Thought Content: Thought content normal. Thought content is not paranoid or delusional. Thought content does not include homicidal or suicidal ideation. Thought content does not include suicidal plan.     ?  Cognition and Memory: Cognition and memory normal.     ?   Judgment: Judgment normal.  ?   Comments: No concerns about meds but eventually wants to stop when retires. ?Fair insight. ?No AIM ?minimal anxiety  ? ? ?Lab Review:  ?   ?Component Value Date/Time  ? NA 140 04/30/2012 0613  ? K 3.9 04/30/2012 0613  ? CL 105 04/30/2012 0613  ? CO2 27 04/30/2012 0613  ? GLUCOSE 90 04/30/2012 0613  ? BUN 15 04/30/2012 0613  ? CREATININE 0.79 04/30/2012 0613  ? CALCIUM 9.4 04/30/2012 0613  ? PROT 7.0 04/27/2012 1930  ? ALBUMIN 3.6 04/27/2012 1930  ? AST 13 04/27/2012 1930  ? ALT 14 04/27/2012 1930  ? ALKPHOS 70 04/27/2012 1930  ? BILITOT 0.1 (L) 04/27/2012 1930  ? GFRNONAA >90 04/30/2012 4536  ? GFRAA >90 04/30/2012 4680  ? ? ?   ?Component Value Date/Time  ? WBC 5.4 04/23/2012 2131  ? RBC 4.54 04/23/2012 2131  ? HGB 14.2 04/23/2012 2131  ? HCT 40.0 04/23/2012 2131  ? PLT 229 04/23/2012 2131  ? MCV 88.1 04/23/2012 2131  ? MCH 31.3 04/23/2012 2131  ? MCHC 35.5 04/23/2012 2131  ? RDW 12.4 04/23/2012 2131  ? LYMPHSABS 2.1 04/23/2012 2131  ? MONOABS 0.4 04/23/2012 2131  ?  EOSABS 0.1 04/23/2012 2131  ? BASOSABS 0.0 04/23/2012 2131  ? ? ?No results found for: POCLITH, LITHIUM  ? ?No results found for: PHENYTOIN, PHENOBARB, VALPROATE, CBMZ  ? ?.res ?Assessment: Plan:   ? ?S

## 2022-03-25 ENCOUNTER — Ambulatory Visit: Payer: BC Managed Care – PPO | Admitting: Obstetrics & Gynecology

## 2022-03-25 ENCOUNTER — Encounter: Payer: Self-pay | Admitting: Obstetrics & Gynecology

## 2022-03-25 VITALS — BP 132/83 | HR 72 | Ht 65.0 in | Wt 240.0 lb

## 2022-03-25 DIAGNOSIS — G5782 Other specified mononeuropathies of left lower limb: Secondary | ICD-10-CM | POA: Diagnosis not present

## 2022-03-25 NOTE — Progress Notes (Signed)
Chief Complaint  Patient presents with   "ovary pressure"      61 y.o. No obstetric history on file. No LMP recorded. Patient has had a hysterectomy. The current method of family planning is status post hysterectomy.  Outpatient Encounter Medications as of 03/25/2022  Medication Sig   buPROPion (WELLBUTRIN XL) 300 MG 24 hr tablet Take 1 tablet (300 mg total) by mouth daily.   carvedilol (COREG) 6.25 MG tablet TAKE 1 TABLET (6.25 MG TOTAL) BY MOUTH 2 (TWO) TIMES DAILY WITH A MEAL.   nystatin (MYCOSTATIN/NYSTOP) powder Apply topically 2 (two) times daily.   nystatin-triamcinolone ointment (MYCOLOG) Apply 1 application topically 2 (two) times daily.   risperiDONE (RISPERDAL) 3 MG tablet Take 0.5 tablets (1.5 mg total) by mouth daily.   topiramate (TOPAMAX) 100 MG tablet Take 1 tablet (100 mg total) by mouth daily.   No facility-administered encounter medications on file as of 03/25/2022.    Subjective Pt with the same pain symptoms as previously noted She has an extensive history of fibromyalgia, sciatica Clinically and on exam 12/01/20 she had ilioinguinal nerve ganglion pain She cant' even remember seeing me last year for it  Past Medical History:  Diagnosis Date   Cardiomyopathy    Depression    Fibromyalgia    Hypertension    Osteoarthritis    Sjogren's syndrome ([place])     Past Surgical History:  Procedure Laterality Date   ABDOMINAL HYSTERECTOMY      OB History   No obstetric history on file.     Allergies  Allergen Reactions   Penicillins Rash   Sulfonamide Derivatives Hives    Social History   Socioeconomic History   Marital status: Married    Spouse name: Not on file   Number of children: Not on file   Years of education: Not on file   Highest education level: Not on file  Occupational History   Not on file  Tobacco Use   Smoking status: Never   Smokeless tobacco: Never  Vaping Use   Vaping Use: Never used  Substance and Sexual Activity    Alcohol use: No    Alcohol/week: 0.0 standard drinks   Drug use: Never   Sexual activity: Yes    Birth control/protection: Surgical    Comment: hyst  Other Topics Concern   Not on file  Social History Narrative   Not on file   Social Determinants of Health   Financial Resource Strain: Not on file  Food Insecurity: Not on file  Transportation Needs: Not on file  Physical Activity: Not on file  Stress: Not on file  Social Connections: Not on file    Family History  Problem Relation Age of Onset   Hypertension Mother     Medications:       Current Outpatient Medications:    buPROPion (WELLBUTRIN XL) 300 MG 24 hr tablet, Take 1 tablet (300 mg total) by mouth daily., Disp: 90 tablet, Rfl: 2   carvedilol (COREG) 6.25 MG tablet, TAKE 1 TABLET (6.25 MG TOTAL) BY MOUTH 2 (TWO) TIMES DAILY WITH A MEAL., Disp: 180 tablet, Rfl: 3   nystatin (MYCOSTATIN/NYSTOP) powder, Apply topically 2 (two) times daily., Disp: , Rfl:    nystatin-triamcinolone ointment (MYCOLOG), Apply 1 application topically 2 (two) times daily., Disp: 30 g, Rfl: 11   risperiDONE (RISPERDAL) 3 MG tablet, Take 0.5 tablets (1.5 mg total) by mouth daily., Disp: 45 tablet, Rfl: 2   topiramate (TOPAMAX) 100 MG tablet,  Take 1 tablet (100 mg total) by mouth daily., Disp: 90 tablet, Rfl: 3  Objective Blood pressure 132/83, pulse 72, height '5\' 5"'$  (1.651 m), weight 240 lb (108.9 kg).  Tender left ilioinguinal nerve area where the rectus abdominus and transversalis meet Bimanual negative non tender no masses  Pertinent ROS No burning with urination, frequency or urgency No nausea, vomiting or diarrhea Nor fever chills or other constitutional symptoms   Labs or studies     Impression + Management Plan: Diagnoses this Encounter::   ICD-10-CM   1. 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   Labs or Scans Ordered during this encounter: No orders of the defined types were placed in this encounter.     Follow up Return if symptoms worsen or fail to improve.

## 2022-04-25 ENCOUNTER — Telehealth: Payer: Self-pay | Admitting: Obstetrics & Gynecology

## 2022-04-25 MED ORDER — NYSTATIN 100000 UNIT/GM EX CREA: 1.0000 | TOPICAL_CREAM | Freq: Two times a day (BID) | CUTANEOUS | 0 refills | Status: AC

## 2022-04-25 NOTE — Addendum Note (Signed)
Addended by: Florian Buff on: 04/25/2022 03:24 PM   Modules accepted: Orders

## 2022-04-25 NOTE — Telephone Encounter (Signed)
Patient would like a refill sent in to [facility] for nystatin. Please advise.

## 2022-04-25 NOTE — Telephone Encounter (Signed)
Pt is requesting a refill on Nystatin cream. Thanks!! Hasty

## 2022-04-26 ENCOUNTER — Other Ambulatory Visit: Payer: Self-pay | Admitting: Obstetrics & Gynecology

## 2022-05-02 ENCOUNTER — Other Ambulatory Visit: Payer: Self-pay | Admitting: Obstetrics & Gynecology

## 2022-05-02 ENCOUNTER — Telehealth: Payer: Self-pay

## 2022-05-02 MED ORDER — NYSTATIN-TRIAMCINOLONE 100000-0.1 UNIT/GM-% EX OINT: 1.0000 | TOPICAL_OINTMENT | Freq: Two times a day (BID) | CUTANEOUS | 11 refills | Status: DC

## 2022-05-02 NOTE — Telephone Encounter (Signed)
Pt called and stated that on July 3, some medication was called in for her, it was the wrong medication.  Nystatin Cream was called in but, she needs Nystatin-tiamenidine ointment instead.  Can some one let her know when it has been called in.

## 2022-11-21 NOTE — Progress Notes (Signed)
Office Visit Note  Patient: Brittany Sweeney             Date of Birth: 1961-04-15           MRN: [id number]             PCP: Malcolm Nation, MD Referring: [place] Nation, MD Visit Date: 12/05/2022 Occupation: @GUAROCC$ @  Subjective:  Pain in legs at night   History of Present Illness: Brittany Sweeney is a 62 y.o. female with Sjogren's syndrome, osteoarthritis, and fibromyalgia.  She is not taking any immunosuppressive agents.  Her sicca symptoms have been very tolerable.  She has not needed to use any over-the-counter products for symptomatic relief.  She denies any parotid swelling or tenderness.  According to the patient she has intermittent canker sores but denies any nasal ulcers.  She denies any recent or recurrent infections.  She denies any new medical conditions.   Patient reports that she has been tapering off of Topamax due to it causing worsening anxiety. She has been taking topamax 50 mg at bedtime for the past 2-3 months. She has not had any migraines recently but has noticed some increased pain in both legs from fibromyalgia.  Patient discussed with her psychiatrist about switching from Topamax to gabapentin.  She previously took low-dose gabapentin 100 mg at bedtime which was helpful.    Activities of Daily Living:  Patient reports morning stiffness for 3 minutes.   Patient Reports nocturnal pain.  Difficulty dressing/grooming: Denies Difficulty climbing stairs: Denies Difficulty getting out of chair: Denies Difficulty using hands for taps, buttons, cutlery, and/or writing: Denies  Review of Systems  Constitutional:  Positive for fatigue.  HENT: Negative.  Negative for mouth sores and mouth dryness.   Eyes:  Positive for dryness.  Respiratory: Negative.  Negative for shortness of breath.   Cardiovascular: Negative.  Negative for chest pain and palpitations.  Gastrointestinal: Negative.  Negative for blood in stool, constipation and diarrhea.  Endocrine:  Negative.  Negative for increased urination.  Genitourinary: Negative.  Negative for involuntary urination.  Musculoskeletal:  Positive for myalgias and myalgias. Negative for gait problem, joint swelling, muscle weakness, morning stiffness and muscle tenderness.  Skin: Negative.  Negative for color change, rash, hair loss and sensitivity to sunlight.  Allergic/Immunologic: Negative.  Negative for susceptible to infections.  Neurological: Negative.  Negative for dizziness and headaches.  Hematological: Negative.  Negative for swollen glands.  Psychiatric/Behavioral:  Positive for sleep disturbance. Negative for depressed mood. The patient is nervous/anxious.     PMFS History:  Patient Active Problem List   Diagnosis Date Noted   Sjogren's syndrome ([place]) 09/22/2016   Fibromyalgia 09/22/2016   Chronic pain syndrome 09/22/2016   Osteoarthritis of both hands 09/22/2016   Migraine 09/22/2016   Schizophrenia, paranoid type ([place]) 04/25/2012   DEPRESSION 10/21/2010   OVERWEIGHT/OBESITY 12/18/2009   Hypertension 12/18/2009   Secondary cardiomyopathy ([place]) 12/18/2009    Past Medical History:  Diagnosis Date   Cardiomyopathy    Depression    Fibromyalgia    Hypertension    Osteoarthritis    Sjogren's syndrome ([place])     Family History  Problem Relation Age of Onset   Hypertension Mother    Past Surgical History:  Procedure Laterality Date   ABDOMINAL HYSTERECTOMY     Social History   Social History Narrative   Not on file   Immunization History  Administered Date(s) Administered   Engineer, 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  Constitutional:      Appearance: She is well-developed.  HENT:     Head: Normocephalic and  atraumatic.  Eyes:     Conjunctiva/sclera: Conjunctivae normal.  Cardiovascular:     Rate and Rhythm: Normal rate and regular rhythm.     Heart sounds: Normal heart sounds.  Pulmonary:     Effort: Pulmonary effort is normal.     Breath sounds: Normal breath sounds.  Abdominal:     General: Bowel sounds are normal.     Palpations: Abdomen is soft.  Musculoskeletal:     Cervical back: Normal range of motion.  Lymphadenopathy:     Cervical: No cervical adenopathy.  Skin:    General: Skin is warm and dry.     Capillary Refill: Capillary refill takes less than 2 seconds.  Neurological:     Mental Status: She is alert and oriented to person, place, and time.  Psychiatric:        Behavior: Behavior normal.      Musculoskeletal Exam: Generalized hyperalgesia and positive tender points.  C-spine has slightly limited ROM with lateral rotation.  No midline spinal tenderness.  Painful ROM of both shoulders.  Elbow joints, wrist joints, MCPs, PIPs, and DIPs good ROM with no synovitis.  Complete fist formation bilaterally.  Hip joints, knee joints, and ankle joints have good ROM with no discomfort.  No warmth or effusion of knee joints.  No tenderness or swelling of ankle joints.   CDAI Exam: CDAI Score: -- Patient Global: --; Provider Global: -- Swollen: --; Tender: -- Joint Exam 12/05/2022   No joint exam has been documented for this visit   There is currently no information documented on the homunculus. Go to the Rheumatology activity and complete the homunculus joint exam.  Investigation: No additional findings.  Imaging: No results found.  Recent Labs: Lab Results  Component Value Date   WBC 5.4 04/23/2012   HGB 14.2 04/23/2012   PLT 229 04/23/2012   NA 140 04/30/2012   K 3.9 04/30/2012   CL 105 04/30/2012   CO2 27 04/30/2012   GLUCOSE 90 04/30/2012   BUN 15 04/30/2012   CREATININE 0.79 04/30/2012   BILITOT 0.1 (L) 04/27/2012   ALKPHOS 70 04/27/2012   AST 13  04/27/2012   ALT 14 04/27/2012   PROT 7.0 04/27/2012   ALBUMIN 3.6 04/27/2012   CALCIUM 9.4 04/30/2012   GFRAA >90 04/30/2012    Speciality Comments: No specialty comments available.  Procedures:  No procedures performed Allergies: Penicillins and Sulfonamide derivatives   Assessment / Plan:     Visit Diagnoses: Sjogren's syndrome with other organ involvement ([place]) - Positive ANA, positive Ro: She has not been experiencing any new or worsening sicca symptoms.  She had no parotid swelling or tenderness on examination today.  No cervical lymphadenopathy was palpable.  No signs of inflammatory arthritis were noted.  Discussed the 4-14 fold increased risk for developing lymphoma in patients with Sjogren's syndrome.  Recommended updating the following lab work including ANA, Ro antibody, La antibody, SPEP, RF, UA, CBC, CMP, complements, and sed rate.  Patient will be having updated lab work in March 2024 with her PCP at  which time she plans on having these labs drawn.  She will have the results forwarded to our office to review.  Recommended following up in 6 months but the patient requested to follow back up in 9 months.  She was advised to notify us if she develops any new or worsening symptoms.  Primary osteoarthritis of both hands: She has PIP and DIP thickening consistent with osteoarthritis of both hands.  No tenderness or inflammation noted.  Complete fist formation bilaterally.  Discussed the importance of joint protection and muscle strengthening.  Trochanteric bursitis of both hips: Intermittently symptomatic.  No tenderness over the trochanteric bursa on examination today.  No groin pain.  Primary osteoarthritis of both feet: She is not experiencing discomfort in her feet at this time.  She is wearing proper fitting shoes.  Fibromyalgia: She experiences intermittent myalgias and muscle tenderness due to fibromyalgia especially in her lower extremities.  She has been starting to titrate  the dose of Topamax due to increased anxiety.  She has been taking Topamax 50 mg at bedtime for the past 2 to 3 months and states that the anxiety has improved and she has not had any recurrence of migraines.  She plans on titrating off of Topamax and will likely be restarting gabapentin 100 mg at bedtime prescribed by psychiatry.  Discussed the importance of regular exercise and good sleep hygiene for management of fibromyalgia.  Chronic pain syndrome  Other medical conditions are listed as follows:   Secondary cardiomyopathy  - She follows up with cardiologist on a yearly basis.  Essential hypertension: BP was 122/87 today in the office.   Migraines  Orders: No orders of the defined types were placed in this encounter.  No orders of the defined types were placed in this encounter.    Follow-Up Instructions: Return in about 6 months (around 06/05/2023) for Sjogren's syndrome, Osteoarthritis.   Ofilia Neas, PA-C  Note - This record has been created using Dragon software.  Chart creation errors have been sought, but may not always  have been located. Such creation errors do not reflect on  the standard of medical care.

## 2022-12-05 ENCOUNTER — Ambulatory Visit (INDEPENDENT_AMBULATORY_CARE_PROVIDER_SITE_OTHER): Payer: BC Managed Care – PPO | Admitting: Psychiatry

## 2022-12-05 ENCOUNTER — Encounter: Payer: Self-pay | Admitting: Psychiatry

## 2022-12-05 ENCOUNTER — Telehealth: Payer: Self-pay | Admitting: Psychiatry

## 2022-12-05 ENCOUNTER — Encounter: Payer: Self-pay | Admitting: Physician Assistant

## 2022-12-05 ENCOUNTER — Ambulatory Visit: Payer: BC Managed Care – PPO | Attending: Physician Assistant | Admitting: Physician Assistant

## 2022-12-05 VITALS — BP 122/87 | HR 88 | Resp 14 | Ht 65.0 in | Wt 243.8 lb

## 2022-12-05 DIAGNOSIS — M19072 Primary osteoarthritis, left ankle and foot: Secondary | ICD-10-CM

## 2022-12-05 DIAGNOSIS — M3509 Sicca syndrome with other organ involvement: Secondary | ICD-10-CM

## 2022-12-05 DIAGNOSIS — G2581 Restless legs syndrome: Secondary | ICD-10-CM | POA: Diagnosis not present

## 2022-12-05 DIAGNOSIS — G43009 Migraine without aura, not intractable, without status migrainosus: Secondary | ICD-10-CM | POA: Diagnosis not present

## 2022-12-05 DIAGNOSIS — M19041 Primary osteoarthritis, right hand: Secondary | ICD-10-CM

## 2022-12-05 DIAGNOSIS — F411 Generalized anxiety disorder: Secondary | ICD-10-CM

## 2022-12-05 DIAGNOSIS — M7062 Trochanteric bursitis, left hip: Secondary | ICD-10-CM

## 2022-12-05 DIAGNOSIS — M19071 Primary osteoarthritis, right ankle and foot: Secondary | ICD-10-CM | POA: Diagnosis not present

## 2022-12-05 DIAGNOSIS — I10 Essential (primary) hypertension: Secondary | ICD-10-CM

## 2022-12-05 DIAGNOSIS — I429 Cardiomyopathy, unspecified: Secondary | ICD-10-CM

## 2022-12-05 DIAGNOSIS — M7061 Trochanteric bursitis, right hip: Secondary | ICD-10-CM

## 2022-12-05 DIAGNOSIS — F333 Major depressive disorder, recurrent, severe with psychotic symptoms: Secondary | ICD-10-CM

## 2022-12-05 DIAGNOSIS — M19042 Primary osteoarthritis, left hand: Secondary | ICD-10-CM

## 2022-12-05 DIAGNOSIS — G894 Chronic pain syndrome: Secondary | ICD-10-CM

## 2022-12-05 DIAGNOSIS — M797 Fibromyalgia: Secondary | ICD-10-CM

## 2022-12-05 DIAGNOSIS — G43809 Other migraine, not intractable, without status migrainosus: Secondary | ICD-10-CM

## 2022-12-05 MED ORDER — RISPERIDONE 3 MG PO TABS: 1.5000 mg | ORAL_TABLET | Freq: Every day | ORAL | 2 refills | Status: DC

## 2022-12-05 MED ORDER — BUPROPION HCL ER (XL) 300 MG PO TB24: 300.0000 mg | ORAL_TABLET | Freq: Every day | ORAL | 2 refills | Status: DC

## 2022-12-05 NOTE — Telephone Encounter (Signed)
Pt called and said that her other doctor she saw today is taking her off the topamax and would like to go on gabapentin 100 mg 1 a day. So dr. Clovis Pu needs to be sent to the pharmacy

## 2022-12-05 NOTE — Progress Notes (Signed)
NOVALY LOOKABAUGH [id number] 24-Jul-1961 62 y.o.   Subjective:   [name] is a 62 y.o. (DOB 1961-03-03) female.  Chief Complaint:  Chief Complaint  Patient presents with   Follow-up   Anxiety    HPI [name] presents  today for follow-up of depression and anxiety.   seen November 2020 & 02/2020.  No meds were changed.  She continued on Wellbutrin XL 300 mg in the morning, gabapentin 100 nightly, risperidone 3 mg 1/2 tablet nightly, Topamax 100 mg tablets 1-1/2 tablets daily.  09/14/20 appt with following noted: Dx. Sjogren's syndrome. Tired from working 3rd shift and otherwise Doing fine.  Ready to retire and hopes for May 2022.  If can get insurance.  Sleep is good and better with 2nd shift.   HA managed with topiramate given originally by neurology. Vaccinated. Plan: No med changes  03/15/2021 appointment with the following noted: Good with mental health.  Not usually depressed.  Going to retire May 05, 2021.  Needs to keep insurance.  Son concerned she won't stay busy enough. Doesn't like her job bc it's hot and hard on body. HA managed Patient reports stable mood and denies depressed or irritable moods. A little restless.  Patient denies any recent difficulty with anxiety.  Patient denies difficulty usually with sleep initiation or maintenance. 8-9 hours.  Denies appetite disturbance.  Patient reports that energy and motivation have been good.  Patient denies any difficulty with concentration.  Patient denies any suicidal ideation.  No fear.  No voices.  08/31/21 appt noted: She continued on Wellbutrin XL 300 mg in the morning, , risperidone 3 mg 1/2 tablet nightly, Topamax 100 mg tablets 1 tablets daily.  Stopped gabapentin 100 nightly No SE Good.  Retired and a tiny bit of anxiety at night.   Patient reports stable mood and denies depressed or irritable moods.  Patient denies any recent difficulty with anxiety.  Patient denies difficulty with sleep  initiation or maintenance. Denies appetite disturbance.  Patient reports that energy and motivation have been good.  Patient denies any difficulty with concentration.  Patient denies any suicidal ideation. Sleep not as good bc sleeping longer and not working as hard.  A little RLS  03/01/22 appt noted:  good except not sleeping as good. Still gets 9 hours.   But not as solid.  Retired in July and nice. Gets a little bored.  Joined fitness and walks 2 miles daily. No SE with meds. No fear or paranoia or mood swings.   Patient reports stable mood and denies depressed or irritable moods.  Patient denies any recent difficulty with anxiety.  Denies appetite disturbance.  Patient reports that energy and motivation have been good.  Patient denies any difficulty with concentration.  Patient denies any suicidal ideation. Plan: No med changes today and she agrees. She continued on Wellbutrin XL 300 mg in the morning, , risperidone 3 mg 1/2 tablet nightly, Topamax 100 mg tablets 1 tablets daily.  12/05/22 appt noted: A little bit of anxiety.  Some restlessness and waking at night.  Restless when sitting down.  Will watch TV in day but is worse at night.   Not too bad.  Some wakening at night but will get back to sleep. Consistent with meds.  Meds make her sleepy at night.   Not depressed or irritable.  No fear or mood swings. Overall she feels she sleeps a little better and is less anxious with less topiramate 50 mg  daily.  Past Psychiatric Medication Trials: Sertraline cognitive side effects,  Wellbutrin 300,  risperidone 1.5,  Gabapentin helped RLS History of psychiatric hospitalization with depression, suicidal thoughts, and paranoia..  Long history of recurrent depression and paranoia  Review of Systems:  Review of Systems  Constitutional:  Negative for fatigue.  Cardiovascular:  Negative for chest pain and palpitations.  Skin:        Skin crawling still some but less and all over but mostly trunk.   Neurological:  Negative for tremors.  Psychiatric/Behavioral:  Negative for agitation, behavioral problems, confusion, decreased concentration, dysphoric mood, hallucinations, self-injury, sleep disturbance and suicidal ideas. The patient is not nervous/anxious and is not hyperactive.     Medications: I have reviewed the patient's current medications.  Current Outpatient Medications  Medication Sig Dispense Refill   carvedilol (COREG) 6.25 MG tablet TAKE 1 TABLET (6.25 MG TOTAL) BY MOUTH 2 (TWO) TIMES DAILY WITH A MEAL. 180 tablet 3   nystatin (MYCOSTATIN/NYSTOP) powder Apply topically 2 (two) times daily.     nystatin cream (MYCOSTATIN) Apply 1 Application topically 2 (two) times daily. 30 g 0   nystatin-triamcinolone ointment (MYCOLOG) APPLY TO AFFECTED AREA TWICE A DAY 30 g 11   nystatin-triamcinolone ointment (MYCOLOG) Apply 1 Application topically 2 (two) times daily. 30 g 11   topiramate (TOPAMAX) 100 MG tablet Take 1 tablet (100 mg total) by mouth daily. 90 tablet 3   buPROPion (WELLBUTRIN XL) 300 MG 24 hr tablet Take 1 tablet (300 mg total) by mouth daily. 90 tablet 2   risperiDONE (RISPERDAL) 3 MG tablet Take 0.5 tablets (1.5 mg total) by mouth daily. 45 tablet 2   No current facility-administered medications for this visit.    Medication Side Effects: None  Allergies:  Allergies  Allergen Reactions   Penicillins Rash   Sulfonamide Derivatives Hives    Past Medical History:  Diagnosis Date   Cardiomyopathy    Depression    Fibromyalgia    Hypertension    Osteoarthritis    Sjogren's syndrome ([place])     Family History  Problem Relation Age of Onset   Hypertension Mother     Social History   Socioeconomic History   Marital status: Married    Spouse name: Not on file   Number of children: Not on file   Years of education: Not on file   Highest education level: Not on file  Occupational History   Not on file  Tobacco Use   Smoking status: Never   Smokeless  tobacco: Never  Vaping Use   Vaping Use: Never used  Substance and Sexual Activity   Alcohol use: No    Alcohol/week: 0.0 standard drinks of alcohol   Drug use: Never   Sexual activity: Yes    Birth control/protection: Surgical    Comment: hyst  Other Topics Concern   Not on file  Social History Narrative   Not on file   Social Determinants of Health   Financial Resource Strain: Not on file  Food Insecurity: Not on file  Transportation Needs: Not on file  Physical Activity: Not on file  Stress: Not on file  Social Connections: Not on file  Intimate Partner Violence: Not on file    Past Medical History, Surgical history, Social history, and Family history were reviewed and updated as appropriate.   Please see review of systems for further details on the patient's review from today.   Objective:   Physical Exam:  There were no vitals taken  for this visit.  Physical Exam Constitutional:      General: She is not in acute distress. Musculoskeletal:        General: No deformity.  Neurological:     Mental Status: She is alert and oriented to person, place, and time.     Cranial Nerves: No dysarthria.     Coordination: Coordination normal.  Psychiatric:        Attention and Perception: Attention and perception normal. She does not perceive auditory or visual hallucinations.        Mood and Affect: Mood is not anxious or depressed. Affect is not labile, blunt or angry.        Speech: Speech normal.        Behavior: Behavior is not slowed. Behavior is cooperative.        Thought Content: Thought content normal. Thought content is not paranoid or delusional. Thought content does not include homicidal or suicidal ideation. Thought content does not include suicidal plan.        Cognition and Memory: Cognition and memory normal.        Judgment: Judgment normal.     Comments: Fair insight. No AIM minimal anxiety     Lab Review:     Component Value Date/Time   NA 140  04/30/2012 0613   K 3.9 04/30/2012 0613   CL 105 04/30/2012 0613   CO2 27 04/30/2012 0613   GLUCOSE 90 04/30/2012 0613   BUN 15 04/30/2012 0613   CREATININE 0.79 04/30/2012 0613   CALCIUM 9.4 04/30/2012 0613   PROT 7.0 04/27/2012 1930   ALBUMIN 3.6 04/27/2012 1930   AST 13 04/27/2012 1930   ALT 14 04/27/2012 1930   ALKPHOS 70 04/27/2012 1930   BILITOT 0.1 (L) 04/27/2012 1930   GFRNONAA >90 04/30/2012 0613   GFRAA >90 04/30/2012 0613       Component Value Date/Time   WBC 5.4 04/23/2012 2131   RBC 4.54 04/23/2012 2131   HGB 14.2 04/23/2012 2131   HCT 40.0 04/23/2012 2131   PLT 229 04/23/2012 2131   MCV 88.1 04/23/2012 2131   MCH 31.3 04/23/2012 2131   MCHC 35.5 04/23/2012 2131   RDW 12.4 04/23/2012 2131   LYMPHSABS 2.1 04/23/2012 2131   MONOABS 0.4 04/23/2012 2131   EOSABS 0.1 04/23/2012 2131   BASOSABS 0.0 04/23/2012 2131    No results found for: "POCLITH", "LITHIUM"   No results found for: "PHENYTOIN", "PHENOBARB", "VALPROATE", "CBMZ"   .res Assessment: Plan:    Severe recurrent major depression with psychotic features ([place]) - Plan: buPROPion (WELLBUTRIN XL) 300 MG 24 hr tablet, risperiDONE (RISPERDAL) 3 MG tablet  Generalized anxiety disorder - Plan: risperiDONE (RISPERDAL) 3 MG tablet  Migraine without aura and without status migrainosus, not intractable  Uncontrolled restless legs syndrome   SX in remission with the meds and she has no med complaints.  She's pleased and satisfied.  History of relapse with attempts to decrease the Risperidone.  Therefore need to continue the current dosage.  She wants to consider coming off some of the medication but it would be risky to do so.  She agrees to defer med changes but wants to consider it later. Retired May 05, 2021.  Discussed potential metabolic side effects associated with atypical antipsychotics, as well as potential risk for movement side effects. Advised pt to contact office if movement side effects occur.   NoAIM Disc risk risperidone might cause or worsen RLS.  If gets worse call.  She doesn't  feel this is bad enough to treat. Disc option of switching topiramate to gabapentin to help with RLS  HA ok with reduced topiramate to 100 mg .  Disc importance of staying active in retirement.  Some concerns her plans seem inadequate and son agrees. Disc relapse prevention.  No med changes today and she agrees. She continued on Wellbutrin XL 300 mg in the morning, , risperidone 3 mg 1/2 tablet nightly, Topamax 100 mg tablets 1 tablets daily.  FU 9 mos  Lynder Parents, MD, DFAPA   Please see After Visit Summary for patient specific instructions.  Future Appointments  Date Time Provider [place]  12/05/2022  1:00 PM Ofilia Neas, PA-C [facility] None    No orders of the defined types were placed in this encounter.     -------------------------------

## 2022-12-05 NOTE — Telephone Encounter (Signed)
This is from the other provider seen today:  She plans on titrating off of Topamax and will likely be restarting gabapentin 100 mg at bedtime prescribed by psychiatry.   Per Dr. Casimiro Needle note today there were no med changes made and no discussion about titrating off Topamax.

## 2022-12-07 NOTE — Telephone Encounter (Signed)
LM that I will call her back again.

## 2022-12-12 NOTE — Telephone Encounter (Signed)
LVM to RC 

## 2022-12-25 NOTE — Telephone Encounter (Signed)
FYI:  Patient said she had discussed with you about weaning off the Topamax. She said she hasn't decided yet if she wants to start gabapentin.

## 2022-12-26 NOTE — Telephone Encounter (Signed)
She has reduced topiramate to 50 mg HS for some time.  Whenever she wants she can stop it and start gabapentin 100 mg 1 at night for 1 week then 2 at night.  Let us know if she wants [place] to send in gabapentin RX

## 2022-12-26 NOTE — Telephone Encounter (Signed)
Notified patient. She said she isn't sure she wants to go back on gabapentin. She will let us know if she does.

## 2023-01-13 DIAGNOSIS — Z1329 Encounter for screening for other suspected endocrine disorder: Secondary | ICD-10-CM | POA: Diagnosis not present

## 2023-01-13 DIAGNOSIS — Z131 Encounter for screening for diabetes mellitus: Secondary | ICD-10-CM | POA: Diagnosis not present

## 2023-01-13 DIAGNOSIS — Z0001 Encounter for general adult medical examination with abnormal findings: Secondary | ICD-10-CM | POA: Diagnosis not present

## 2023-01-13 DIAGNOSIS — Z13220 Encounter for screening for lipoid disorders: Secondary | ICD-10-CM | POA: Diagnosis not present

## 2023-01-13 DIAGNOSIS — E559 Vitamin D deficiency, unspecified: Secondary | ICD-10-CM | POA: Diagnosis not present

## 2023-01-13 DIAGNOSIS — R7989 Other specified abnormal findings of blood chemistry: Secondary | ICD-10-CM | POA: Diagnosis not present

## 2023-01-13 LAB — LAB REPORT - SCANNED: EGFR: 9.9

## 2023-01-14 LAB — HEMOGLOBIN A1C: A1c: 5.6

## 2023-01-17 DIAGNOSIS — M3500 Sicca syndrome, unspecified: Secondary | ICD-10-CM | POA: Diagnosis not present

## 2023-01-17 DIAGNOSIS — I509 Heart failure, unspecified: Secondary | ICD-10-CM | POA: Diagnosis not present

## 2023-01-17 DIAGNOSIS — M797 Fibromyalgia: Secondary | ICD-10-CM | POA: Diagnosis not present

## 2023-01-17 DIAGNOSIS — I10 Essential (primary) hypertension: Secondary | ICD-10-CM | POA: Diagnosis not present

## 2023-01-17 DIAGNOSIS — Z0001 Encounter for general adult medical examination with abnormal findings: Secondary | ICD-10-CM | POA: Diagnosis not present

## 2023-01-24 ENCOUNTER — Telehealth: Payer: Self-pay | Admitting: *Deleted

## 2023-01-24 NOTE — Telephone Encounter (Addendum)
Labs received from:Dr. Roberto Scales  Drawn on:01/13/2023  Reviewed by:Dr. Bo Merino   Labs drawn:UA, SS-A, SS-B, C3, SPEP, Sed rate, Hgb A1C, Vitamin D, CBC, CMP  Results:SS-A 6.0  UA- WBC Esterase   Lymphocyte # 0.8  Lymphocyte %1 15.5  Granulocytes % 1 80.0

## 2023-02-17 ENCOUNTER — Other Ambulatory Visit: Payer: Self-pay | Admitting: Psychiatry

## 2023-02-17 DIAGNOSIS — G43009 Migraine without aura, not intractable, without status migrainosus: Secondary | ICD-10-CM

## 2023-03-17 ENCOUNTER — Emergency Department (HOSPITAL_COMMUNITY): Payer: BC Managed Care – PPO

## 2023-03-17 ENCOUNTER — Other Ambulatory Visit: Payer: Self-pay

## 2023-03-17 ENCOUNTER — Encounter (HOSPITAL_COMMUNITY): Payer: Self-pay

## 2023-03-17 ENCOUNTER — Emergency Department (HOSPITAL_COMMUNITY)
Admission: EM | Admit: 2023-03-17 | Discharge: 2023-03-17 | Disposition: A | Discharge status: Home / Self Care | Payer: BC Managed Care – PPO | Attending: Emergency Medicine | Admitting: Emergency Medicine

## 2023-03-17 DIAGNOSIS — R1013 Epigastric pain: Secondary | ICD-10-CM | POA: Insufficient documentation

## 2023-03-17 DIAGNOSIS — I10 Essential (primary) hypertension: Secondary | ICD-10-CM | POA: Insufficient documentation

## 2023-03-17 DIAGNOSIS — K8020 Calculus of gallbladder without cholecystitis without obstruction: Secondary | ICD-10-CM

## 2023-03-17 DIAGNOSIS — R109 Unspecified abdominal pain: Secondary | ICD-10-CM | POA: Diagnosis present

## 2023-03-17 HISTORY — DX: Calculus of gallbladder without cholecystitis without obstruction: K80.20

## 2023-03-17 LAB — URINALYSIS, ROUTINE W REFLEX MICROSCOPIC
Bilirubin Urine: NEGATIVE
Glucose, UA: NEGATIVE mg/dL
Hgb urine dipstick: NEGATIVE
Ketones, ur: NEGATIVE mg/dL
Nitrite: NEGATIVE
Protein, ur: NEGATIVE mg/dL
Specific Gravity, Urine: 1.006 (ref 1.005–1.030)
pH: 7 (ref 5.0–8.0)

## 2023-03-17 LAB — COMPREHENSIVE METABOLIC PANEL
ALT: 25 U/L (ref 0–44)
AST: 23 U/L (ref 15–41)
Albumin: 4 g/dL (ref 3.5–5.0)
Alkaline Phosphatase: 62 U/L (ref 38–126)
Anion gap: 9 (ref 5–15)
BUN: 15 mg/dL (ref 8–23)
CO2: 25 mmol/L (ref 22–32)
Calcium: 9.2 mg/dL (ref 8.9–10.3)
Chloride: 102 mmol/L (ref 98–111)
Creatinine, Ser: 0.77 mg/dL (ref 0.44–1.00)
GFR, Estimated: >60 mL/min (ref >60)
Glucose, Bld: 124 mg/dL — ABNORMAL HIGH (ref 70–99)
Potassium: 3.7 mmol/L (ref 3.5–5.1)
Sodium: 136 mmol/L (ref 135–145)
Total Bilirubin: 0.9 mg/dL (ref 0.3–1.2)
Total Protein: 7.3 g/dL (ref 6.5–8.1)

## 2023-03-17 LAB — CBC WITH DIFFERENTIAL/PLATELET
Abs Immature Granulocytes: 0.06 10*3/uL (ref 0.00–0.07)
Basophils Absolute: 0 10*3/uL (ref 0.0–0.1)
Basophils Relative: 0 %
Eosinophils Absolute: 0 10*3/uL (ref 0.0–0.5)
Eosinophils Relative: 0 %
HCT: 41.7 % (ref 36.0–46.0)
Hemoglobin: 13.9 g/dL (ref 12.0–15.0)
Immature Granulocytes: 1 %
Lymphocytes Relative: 5 %
Lymphs Abs: 0.6 10*3/uL — ABNORMAL LOW (ref 0.7–4.0)
MCH: 30.3 pg (ref 26.0–34.0)
MCHC: 33.3 g/dL (ref 30.0–36.0)
MCV: 91 fL (ref 80.0–100.0)
Monocytes Absolute: 1 10*3/uL (ref 0.1–1.0)
Monocytes Relative: 8 %
Neutro Abs: 10.4 10*3/uL — ABNORMAL HIGH (ref 1.7–7.7)
Neutrophils Relative %: 86 %
Platelets: 173 10*3/uL (ref 150–400)
RBC: 4.58 MIL/uL (ref 3.87–5.11)
RDW: 13.2 % (ref 11.5–15.5)
WBC: 12.1 10*3/uL — ABNORMAL HIGH (ref 4.0–10.5)
nRBC: 0 % (ref 0.0–0.2)

## 2023-03-17 LAB — LIPASE, BLOOD: Lipase: 31 U/L (ref 11–51)

## 2023-03-17 MED ORDER — ONDANSETRON 4 MG PO TBDP: 4.0000 mg | ORAL_TABLET | Freq: Three times a day (TID) | ORAL | 0 refills | Status: DC | PRN

## 2023-03-17 MED ORDER — IOHEXOL 300 MG/ML  SOLN
100.0000 mL | Freq: Once | INTRAMUSCULAR | Status: AC | PRN
  Administered 2023-03-17: 100 mL via INTRAVENOUS

## 2023-03-17 MED ORDER — HYDROCODONE-ACETAMINOPHEN 5-325 MG PO TABS: 1.0000 | ORAL_TABLET | Freq: Four times a day (QID) | ORAL | 0 refills | Status: DC | PRN

## 2023-03-17 NOTE — ED Provider Notes (Signed)
Signout from Dr. Judd Lien.  62 year old female here with upper abdominal pain.  White count of 12.  She is pending CT abdomen and pelvis with contrast.  Disposition per results of testing. Physical Exam  BP (!) 146/85   Pulse 90   Temp 99.7 F (37.6 C) (Oral)   Resp 18   Ht 5\' 5"  (1.651 m)   Wt 106.6 kg   SpO2 93%   BMI 39.11 kg/m   Physical Exam  Procedures  Procedures  ED Course / MDM    Medical Decision Making Amount and/or Complexity of Data Reviewed Labs: ordered. Radiology: ordered.  Risk Prescription drug management.   Patient states she is having no pain now just a little bit of pressure.  No fevers nausea vomiting.  She has not seen a surgeon before regarding her gallstones although she knows she has them.  CT showing concerns for acute cholecystitis.  Right upper quadrant ultrasound showing cholelithiasis and wall thickening although equivocal for cholecystitis.  Her pain is improved.  Discussed with general surgery Dr. Robyne Peers.  She felt the patient was appropriate for outpatient follow-up low-fat diet and return instructions discussed.  Patient comfortable plan for outpatient follow-up.       Terrilee Files, MD 03/17/23 [place] Fruit

## 2023-03-17 NOTE — ED Notes (Signed)
Up amb to [facility] without difficulty

## 2023-03-17 NOTE — ED Notes (Signed)
Pt requesting to take her coreg. Message

## 2023-03-17 NOTE — ED Notes (Signed)
Pt requested a rx for nausea. Edp verbalized he would add to pharmacy a zofran order.

## 2023-03-17 NOTE — ED Provider Notes (Signed)
[place] EMERGENCY DEPARTMENT AT [facility] Provider Note   CSN: [id number] Arrival date & time: 03/17/23  0458     History  Chief Complaint  Patient presents with   Abdominal Pain    Brittany Sweeney is a 62 y.o. female.  Patient is a 62 year old female with past medical history, hypertension, fibromyalgia, Sjogren's syndrome, schizophrenia, depression.  Patient presenting today with complaints of abdominal pain.  She started yesterday evening with discomfort across her upper abdomen that came and went.  Pain worsened again this morning and presents for evaluation of this.  She denies any fevers or chills.  She denies any diarrhea or vomiting.  She was concerned she might have an ulcer, so she drank vinegar but this did not seem to help.  The history is provided by the patient.       Home Medications Prior to Admission medications   Medication Sig Start Date End Date Taking? Authorizing Provider  buPROPion (WELLBUTRIN XL) 300 MG 24 hr tablet Take 1 tablet (300 mg total) by mouth daily. 12/05/22   Cottle, Steva Ready., MD  carvedilol (COREG) 6.25 MG tablet TAKE 1 TABLET (6.25 MG TOTAL) BY MOUTH 2 (TWO) TIMES DAILY WITH A MEAL. 12/04/19   Antoine Poche, MD  nystatin (MYCOSTATIN/NYSTOP) powder Apply topically 2 (two) times daily. 06/25/20   [provider]  nystatin cream (MYCOSTATIN) Apply 1 Application topically 2 (two) times daily. 04/25/22   Lazaro Arms, MD  nystatin-triamcinolone ointment (MYCOLOG) APPLY TO AFFECTED AREA TWICE A DAY 06/08/22   Lazaro Arms, MD  nystatin-triamcinolone ointment [facility]) Apply 1 Application topically 2 (two) times daily. 05/02/22   Lazaro Arms, MD  risperiDONE (RISPERDAL) 3 MG tablet Take 0.5 tablets (1.5 mg total) by mouth daily. 12/05/22   Cottle, Steva Ready., MD  topiramate (TOPAMAX) 100 MG tablet TAKE 1 TABLET BY MOUTH EVERY DAY 02/19/23   Cottle, Steva Ready., MD      Allergies    Penicillins and Sulfonamide  derivatives    Review of Systems   Review of Systems  All other systems reviewed and are negative.   Physical Exam Updated Vital Signs BP (!) 154/85   Pulse 95   Temp 99.7 F (37.6 C) (Oral)   Resp 18   Ht 5\' 5"  (1.651 m)   Wt 106.6 kg   SpO2 95%   BMI 39.11 kg/m  Physical Exam Vitals and nursing note reviewed.  Constitutional:      General: She is not in acute distress.    Appearance: She is well-developed. She is not diaphoretic.  HENT:     Head: Normocephalic and atraumatic.  Cardiovascular:     Rate and Rhythm: Normal rate and regular rhythm.     Heart sounds: No murmur heard.    No friction rub. No gallop.  Pulmonary:     Effort: Pulmonary effort is normal. No respiratory distress.     Breath sounds: Normal breath sounds. No wheezing.  Abdominal:     General: Bowel sounds are normal. There is no distension.     Palpations: Abdomen is soft.     Tenderness: There is abdominal tenderness in the epigastric area. There is no right CVA tenderness, left CVA tenderness, guarding or rebound.  Musculoskeletal:        General: Normal range of motion.     Cervical back: Normal range of motion and neck supple.  Skin:    General: Skin is warm and dry.  Neurological:     General: No focal deficit present.     Mental Status: She is alert and oriented to person, place, and time.     ED Results / Procedures / Treatments   Labs (all labs ordered are listed, but only abnormal results are displayed) Labs Reviewed  URINALYSIS, ROUTINE W REFLEX MICROSCOPIC    EKG None  Radiology No results found.  Procedures Procedures  {Document cardiac monitor, telemetry assessment procedure when appropriate:1}  Medications Ordered in ED Medications - No data to display  ED Course/ Medical Decision Making/ A&P   {   Click here for ABCD2, HEART and other calculatorsREFRESH Note before signing :1}                          Medical Decision Making Amount and/or Complexity of Data  Reviewed Labs: ordered. Radiology: ordered.  Risk Prescription drug management.   ***  {Document critical care time when appropriate:1} {Document review of labs and clinical decision tools ie heart score, Chads2Vasc2 etc:1}  {Document your independent review of radiology images, and any outside records:1} {Document your discussion with family members, caretakers, and with consultants:1} {Document social determinants of health affecting pt's care:1} {Document your decision making why or why not admission, treatments were needed:1} Final Clinical Impression(s) / ED Diagnoses Final diagnoses:  None    Rx / DC Orders ED Discharge Orders     None

## 2023-03-17 NOTE — Discharge Instructions (Signed)
You were seen in the emergency department for upper abdominal pain.  You had blood work CAT scan and ultrasound that showed gallstones in your gallbladder and this is likely the cause of your pain.  This will need follow-up with general surgery Dr. Robyne Peers.  Please try to adhere to a low-fat diet.  We are prescribing a short course of narcotic pain medication to use as needed although if the pain is lasting greater than 2 hours or associated with any fever please return to the emergency department.

## 2023-03-17 NOTE — ED Triage Notes (Signed)
On and off abd pain since yesterday.  States she has hx. Of gallstones

## 2023-06-15 ENCOUNTER — Telehealth: Payer: Self-pay | Admitting: Rheumatology

## 2023-06-15 NOTE — Telephone Encounter (Signed)
Pt called asking if there was a data breach in February the 21st of this year. Call back [phone]

## 2023-08-25 NOTE — Progress Notes (Signed)
Office Visit Note  Patient: Brittany Sweeney             Date of Birth: 06-Feb-1961           MRN: [id number]             PCP: Donetta Potts, MD Referring: Donetta Potts, MD Visit Date: 09/05/2023 Occupation: @GUAROCC @  Subjective:  Joint stiffness  History of Present Illness: Brittany Sweeney is a 62 y.o. female with Sjogren's, osteoarthritis and fibromyalgia syndrome.  She continues to have some dry eye symptoms.  She denies any dry mouth symptoms.  She continues to have some stiffness in her hands but no joint swelling.  She has intermittent discomfort in the trochanteric region.  Restless leg syndrome which causes discomfort at night.  She states that fibromyalgia symptoms are manageable with intermittent flares.  She came off several medications since the last visit.    Activities of Daily Living:  Patient reports morning stiffness for 5 minutes.   Patient Reports nocturnal pain.  Difficulty dressing/grooming: Denies Difficulty climbing stairs: Denies Difficulty getting out of chair: Denies Difficulty using hands for taps, buttons, cutlery, and/or writing: Denies  Review of Systems  Constitutional:  Positive for fatigue.  HENT:  Positive for nose dryness. Negative for mouth sores and mouth dryness.   Eyes:  Positive for dryness.  Respiratory:  Negative for shortness of breath.   Cardiovascular:  Negative for chest pain and palpitations.  Gastrointestinal:  Negative for blood in stool, constipation and diarrhea.  Endocrine: Negative for increased urination.  Genitourinary:  Negative for involuntary urination.  Musculoskeletal:  Positive for joint pain, joint pain, joint swelling, myalgias, morning stiffness, muscle tenderness and myalgias. Negative for gait problem and muscle weakness.  Skin:  Positive for hair loss. Negative for color change, rash and sensitivity to sunlight.  Allergic/Immunologic: Negative for susceptible to infections.  Neurological:  Negative  for dizziness and headaches.  Hematological:  Negative for swollen glands.  Psychiatric/Behavioral:  Negative for depressed mood and sleep disturbance. The patient is not nervous/anxious.     PMFS History:  Patient Active Problem List   Diagnosis Date Noted   Sjogren's syndrome (HCC) 09/22/2016   Fibromyalgia 09/22/2016   Chronic pain syndrome 09/22/2016   Osteoarthritis of both hands 09/22/2016   Migraine 09/22/2016   Schizophrenia, paranoid type (HCC) 04/25/2012   DEPRESSION 10/21/2010   OVERWEIGHT/OBESITY 12/18/2009   Hypertension 12/18/2009   Secondary cardiomyopathy (HCC) 12/18/2009    Past Medical History:  Diagnosis Date   Cardiomyopathy    Depression    Fibromyalgia    Gall stones    Hypertension    Osteoarthritis    Sjogren's syndrome (HCC)     Family History  Problem Relation Age of Onset   Hypertension Mother    Past Surgical History:  Procedure Laterality Date   ABDOMINAL HYSTERECTOMY     Social History   Social History Narrative   Not on file   Immunization History  Administered Date(s) Administered   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  Constitutional:      Appearance: She is well-developed.  HENT:     Head: Normocephalic and atraumatic.  Eyes:     Conjunctiva/sclera: Conjunctivae normal.  Cardiovascular:     Rate and Rhythm: Normal rate and regular rhythm.     Heart sounds: Normal heart sounds.  Pulmonary:     Effort: Pulmonary effort is normal.     Breath sounds: Normal breath sounds.  Abdominal:     General: Bowel sounds are normal.     Palpations: Abdomen is soft.  Musculoskeletal:     Cervical back: Normal range of motion.  Lymphadenopathy:     Cervical: No cervical  adenopathy.  Skin:    General: Skin is warm and dry.     Capillary Refill: Capillary refill takes less than 2 seconds.  Neurological:     Mental Status: She is alert and oriented to person, place, and time.  Psychiatric:        Behavior: Behavior normal.      Musculoskeletal Exam: She had some limitation with range of motion of the cervical spine.  She had no tenderness over thoracic or lumbar spine.  Shoulder joints, elbow joints, wrist joints, MCPs PIPs and DIPs with good range of motion with no synovitis.  Hip joints and knee joints with good range of motion.  She had mild tenderness over trochanteric region.  Knee joints were in good range of motion without any warmth swelling or effusion.  There was no tenderness over ankles or MTPs.  She had generalized hyperalgesia and positive tender points.  CDAI Exam: CDAI Score: -- Patient Global: --; Provider Global: -- Swollen: --; Tender: -- Joint Exam 09/05/2023   No joint exam has been documented for this visit   There is currently no information documented on the homunculus. Go to the Rheumatology activity and complete the homunculus joint exam.  Investigation: No additional findings.  Imaging: No results found.  Recent Labs: Lab Results  Component Value Date   WBC 12.1 (H) 03/17/2023   HGB 13.9 03/17/2023   PLT 173 03/17/2023   NA 136 03/17/2023   K 3.7 03/17/2023   CL 102 03/17/2023   CO2 25 03/17/2023   GLUCOSE 124 (H) 03/17/2023   BUN 15 03/17/2023   CREATININE 0.77 03/17/2023   BILITOT 0.9 03/17/2023   ALKPHOS 62 03/17/2023   AST 23 03/17/2023   ALT 25 03/17/2023   PROT 7.3 03/17/2023   ALBUMIN 4.0 03/17/2023   CALCIUM 9.2 03/17/2023   GFRAA >90 04/30/2012    Speciality Comments: No specialty comments available.  Procedures:  No procedures performed Allergies: Penicillins and Sulfonamide derivatives   Assessment / Plan:     Visit Diagnoses: Sjogren's syndrome with other organ involvement (HCC) -  Positive ANA, positive Ro: Patient has mild sicca symptoms mostly dry eyes.  She had good pool of saliva under her tongue.  She denies any shortness of breath, palpitations or lymphadenopathy.  She has an appointment coming up with the cardiologist.  I advised her to get CBC with differential, CMP with GFR, UA, anti-SSA antibodies with complements.  Patient declined labs.  Patient states that she is not very symptomatic and will contact us if she develops any new symptoms.  Primary osteoarthritis of both hands-she had bilateral PIP and DIP thickening with no synovitis.  Joint protection muscle strengthening was discussed.  Trochanteric bursitis of both hips-she gives history of only intermittent symptoms.  IT band stretches were discussed.  Primary osteoarthritis of both feet--proper fitting shoes were advised.  Fibromyalgia-patient states her fibromyalgia symptoms are manageable.  She is off Topamax now.  Need for regular  exercises and stretching was discussed.  Chronic pain syndrome  Secondary cardiomyopathy  - She follows up with cardiologist on a yearly basis.  Essential hypertension-blood pressure was normal at 137/88.  Migraines  Orders: No orders of the defined types were placed in this encounter.  No orders of the defined types were placed in this encounter.    Follow-Up Instructions: Return in about 9 months (around 06/04/2024) for Sjogren's, Osteoarthritis.   Pollyann Savoy, MD  Note - This record has been created using Animal nutritionist.  Chart creation errors have been sought, but may not always  have been located. Such creation errors do not reflect on  the standard of medical care.

## 2023-09-03 NOTE — Progress Notes (Unsigned)
Cardiology Office Note   Date:  09/06/2023   ID:  Brittany Sweeney, DOB 1961-09-15, MRN [id number]  PCP:  Donetta Potts, MD  Cardiologist:   Dietrich Pates, MD   Pt with hx of cardimyopathy  Presents for eval of CP     History of Present Illness: Brittany Sweeney is a 62 y.o. female with hx of fibromyalgia, HTN atypical CP and cardiomyopathy  She was seen in early  2000s by [facility] for cardiomyopathy  LVEF ws reduced in 2001   It then normalized  Last echo 2005.  2022  Echocardiogram showed LVEF was normal sized and thickness; LVEf was normal  There was gr I diastolic dysfunction; LA was moderately dilated     I saw the pt in April 2023  Since seen she says she is feeling good  Denies CP   No dizziness NO SOB    No palpitations   Current Meds  Medication Sig   buPROPion (WELLBUTRIN XL) 300 MG 24 hr tablet Take 1 tablet (300 mg total) by mouth daily.   carvedilol (COREG) 6.25 MG tablet TAKE 1 TABLET (6.25 MG TOTAL) BY MOUTH 2 (TWO) TIMES DAILY WITH A MEAL.   nystatin (MYCOSTATIN/NYSTOP) powder Apply topically as needed.   nystatin cream (MYCOSTATIN) Apply 1 Application topically 2 (two) times daily. (Patient taking differently: Apply 1 Application topically as needed.)   nystatin-triamcinolone ointment (MYCOLOG) APPLY TO AFFECTED AREA TWICE A DAY (Patient taking differently: as needed.)   risperiDONE (RISPERDAL) 3 MG tablet Take 0.5 tablets (1.5 mg total) by mouth daily.     Allergies:   Penicillins and Sulfonamide derivatives   Past Medical History:  Diagnosis Date   Cardiomyopathy    Depression    Fibromyalgia    Gall stones    Hypertension    Osteoarthritis    Sjogren's syndrome (HCC)     Past Surgical History:  Procedure Laterality Date   ABDOMINAL HYSTERECTOMY       Social History:  The patient  reports that she has never smoked. She has been exposed to tobacco smoke. She has never used smokeless tobacco. She reports that she does not drink alcohol and does not  use drugs.   Family History:  The patient's family history includes Hypertension in her mother.    ROS:  Please see the history of present illness. All other systems are reviewed and  Negative to the above problem except as noted.    PHYSICAL EXAM: VS:  BP 118/70   Pulse 74   Ht 5\' 5"  (1.651 m)   Wt 236 lb 9.6 oz (107.3 kg)   SpO2 95%   BMI 39.37 kg/m   GEN: Obese 62 yo  in no acute distress  HEENT: normal  Neck: no JVD, carotid bruits, Cardiac: RRR; no murmurs,  No LE edema  Respiratory:  clear to auscultation bilaterally, GI: soft, nontender, nondistended, + BS  No hepatomegaly  MS: no deformity Moving all extremities   Skin: warm and dry, no rash Neuro:  Strength and sensation are intact Psych: euthymic mood, full affect   EKG:  EKG is ordered today.  Possible atypical atrial flutter     Echo Jan 2022 [facility]) Summary    1. The left ventricle is normal in size with upper normal wall thickness.    2. The left ventricular systolic function is normal, LVEF is visually  estimated at 55-60%.    3. There is grade I diastolic dysfunction (impaired relaxation).  4. There is mild mitral valve regurgitation.    5. The left atrium is moderately dilated in size.    6. The right ventricle is upper normal in size, with normal systolic  function.  Lipid Panel    Component Value Date/Time   CHOL 181 07/02/2009 0000   TRIG 207 07/02/2009 0000   HDL 54 07/02/2009 0000   LDLCALC 86 07/02/2009 0000      Wt Readings from Last 3 Encounters:  09/06/23 236 lb 9.6 oz (107.3 kg)  09/05/23 237 lb (107.5 kg)  03/17/23 235 lb (106.6 kg)      ASSESSMENT AND PLAN:   1  Hx HFrEF   REmote   LVEF normal in 2022   Volume status OK  Keep on current regimen   2  Rhythm   EKG is different from previous    Difficult to read   Will set up for a Zio patch      3  Hx CP   Atypical  Pt denies at present     3  HTN  BP is well controlled        4  Lipids   Last lipds in 2022  LDL 84  HDL  47    5  Obesity  Discussed diet  Cut back on carbs/sugar   Increase veggies, whole /nonprocessed foods   Pt joined gym     5  Numbness   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  The patient does not have concerns regarding medicines.  Signed, Dietrich Pates, MD  09/06/2023 3:28 PM    [facility] [address] [place], [place], [place]  [address] Phone: [phone]; Fax: [phone]

## 2023-09-05 ENCOUNTER — Ambulatory Visit (INDEPENDENT_AMBULATORY_CARE_PROVIDER_SITE_OTHER): Payer: BC Managed Care – PPO | Admitting: Psychiatry

## 2023-09-05 ENCOUNTER — Ambulatory Visit: Payer: BC Managed Care – PPO | Attending: Rheumatology | Admitting: Rheumatology

## 2023-09-05 ENCOUNTER — Encounter: Payer: Self-pay | Admitting: Psychiatry

## 2023-09-05 ENCOUNTER — Encounter: Payer: Self-pay | Admitting: Rheumatology

## 2023-09-05 VITALS — BP 137/88 | HR 76 | Resp 15 | Ht 65.0 in | Wt 237.0 lb

## 2023-09-05 DIAGNOSIS — M3509 Sicca syndrome with other organ involvement: Secondary | ICD-10-CM | POA: Diagnosis not present

## 2023-09-05 DIAGNOSIS — M19041 Primary osteoarthritis, right hand: Secondary | ICD-10-CM

## 2023-09-05 DIAGNOSIS — M19071 Primary osteoarthritis, right ankle and foot: Secondary | ICD-10-CM

## 2023-09-05 DIAGNOSIS — I429 Cardiomyopathy, unspecified: Secondary | ICD-10-CM

## 2023-09-05 DIAGNOSIS — G2581 Restless legs syndrome: Secondary | ICD-10-CM | POA: Diagnosis not present

## 2023-09-05 DIAGNOSIS — M7061 Trochanteric bursitis, right hip: Secondary | ICD-10-CM

## 2023-09-05 DIAGNOSIS — F411 Generalized anxiety disorder: Secondary | ICD-10-CM | POA: Diagnosis not present

## 2023-09-05 DIAGNOSIS — I10 Essential (primary) hypertension: Secondary | ICD-10-CM

## 2023-09-05 DIAGNOSIS — G43809 Other migraine, not intractable, without status migrainosus: Secondary | ICD-10-CM

## 2023-09-05 DIAGNOSIS — M797 Fibromyalgia: Secondary | ICD-10-CM

## 2023-09-05 DIAGNOSIS — M19072 Primary osteoarthritis, left ankle and foot: Secondary | ICD-10-CM

## 2023-09-05 DIAGNOSIS — F333 Major depressive disorder, recurrent, severe with psychotic symptoms: Secondary | ICD-10-CM

## 2023-09-05 DIAGNOSIS — M19042 Primary osteoarthritis, left hand: Secondary | ICD-10-CM

## 2023-09-05 DIAGNOSIS — G894 Chronic pain syndrome: Secondary | ICD-10-CM

## 2023-09-05 DIAGNOSIS — M7062 Trochanteric bursitis, left hip: Secondary | ICD-10-CM

## 2023-09-05 MED ORDER — RISPERIDONE 3 MG PO TABS
1.5000 mg | ORAL_TABLET | Freq: Every day | ORAL | 2 refills | Status: DC
Start: 2023-09-05 — End: 2024-06-04

## 2023-09-05 MED ORDER — BUPROPION HCL ER (XL) 300 MG PO TB24: 300.0000 mg | ORAL_TABLET | Freq: Every day | ORAL | 2 refills | Status: DC

## 2023-09-05 NOTE — Progress Notes (Signed)
Brittany Sweeney [id number] August 19, 1961 62 y.o.   Subjective:   [name] is a 62 y.o. (DOB 1960-11-18) female.  Chief Complaint:  Chief Complaint  Patient presents with   Follow-up   Depression   Anxiety   Sleeping Problem    HPI Brittany Sweeney presents  today for follow-up of depression and anxiety.   seen November 2020 & 02/2020.  No meds were changed.  She continued on Wellbutrin XL 300 mg in the morning, gabapentin 100 nightly, risperidone 3 mg 1/2 tablet nightly, Topamax 100 mg tablets 1-1/2 tablets daily.  09/14/20 appt with following noted: Dx. Sjogren's syndrome. Tired from working 3rd shift and otherwise Doing fine.  Ready to retire and hopes for May 2022.  If can get insurance.  Sleep is good and better with 2nd shift.   HA managed with topiramate given originally by neurology. Vaccinated. Plan: No med changes  03/15/2021 appointment with the following noted: Good with mental health.  Not usually depressed.  Going to retire May 05, 2021.  Needs to keep insurance.  Son concerned she won't stay busy enough. Doesn't like her job bc it's hot and hard on body. HA managed Patient reports stable mood and denies depressed or irritable moods. A little restless.  Patient denies any recent difficulty with anxiety.  Patient denies difficulty usually with sleep initiation or maintenance. 8-9 hours.  Denies appetite disturbance.  Patient reports that energy and motivation have been good.  Patient denies any difficulty with concentration.  Patient denies any suicidal ideation.  No fear.  No voices.  08/31/21 appt noted: She continued on Wellbutrin XL 300 mg in the morning, , risperidone 3 mg 1/2 tablet nightly, Topamax 100 mg tablets 1 tablets daily.  Stopped gabapentin 100 nightly No SE Good.  Retired and a tiny bit of anxiety at night.   Patient reports stable mood and denies depressed or irritable moods.  Patient denies any recent difficulty with anxiety.  Patient  denies difficulty with sleep initiation or maintenance. Denies appetite disturbance.  Patient reports that energy and motivation have been good.  Patient denies any difficulty with concentration.  Patient denies any suicidal ideation. Sleep not as good bc sleeping longer and not working as hard.  A little RLS  03/01/22 appt noted:  good except not sleeping as good. Still gets 9 hours.   But not as solid.  Retired in July and nice. Gets a little bored.  Joined fitness and walks 2 miles daily. No SE with meds. No fear or paranoia or mood swings.   Patient reports stable mood and denies depressed or irritable moods.  Patient denies any recent difficulty with anxiety.  Denies appetite disturbance.  Patient reports that energy and motivation have been good.  Patient denies any difficulty with concentration.  Patient denies any suicidal ideation. Plan: No med changes today and she agrees. She continued on Wellbutrin XL 300 mg in the morning, , risperidone 3 mg 1/2 tablet nightly, Topamax 100 mg tablets 1 tablets daily.  12/05/22 appt noted: A little bit of anxiety.  Some restlessness and waking at night.  Restless when sitting down.  Will watch TV in day but is worse at night.   Not too bad.  Some wakening at night but will get back to sleep. Consistent with meds.  Meds make her sleepy at night.   Not depressed or irritable.  No fear or mood swings. Overall she feels she sleeps a little better and is  less anxious with less topiramate 50 mg daily. Plan no med changes  01/15/23 TC: considering trial gabapentin in place of topiramate. MD:  She has reduced topiramate to 50 mg HS for some time.  Whenever she wants she can stop it and start gabapentin 100 mg 1 at night for 1 week then 2 at night.  Let us know if she wants [place] to send in gabapentin RX      09/05/23 appt noted: Meds: Better since off topiramate.  RLS is better.  No return of migraine. She continued on Wellbutrin XL 300 mg in the morning, ,  risperidone 3 mg 1/2 tablet nightly,  No SE. Mood and anxiety are better than usual.  No swings.  A tad of anxiety. Sleep good now.   Health is pretty good.  Usually works out daily.   Past Psychiatric Medication Trials: Sertraline cognitive side effects,  Wellbutrin 300,  risperidone 1.5,  Gabapentin helped RLS History of psychiatric hospitalization with depression, suicidal thoughts, and paranoia..  Long history of recurrent depression and paranoia  Review of Systems:  Review of Systems  Constitutional:  Negative for fatigue.  Cardiovascular:  Negative for chest pain and palpitations.  Skin:        Skin crawling still some but less and all over but mostly trunk.  Neurological:  Negative for tremors and headaches.  Psychiatric/Behavioral:  Negative for agitation, behavioral problems, confusion, decreased concentration, dysphoric mood, hallucinations, self-injury, sleep disturbance and suicidal ideas. The patient is not nervous/anxious and is not hyperactive.     Medications: I have reviewed the patient's current medications.  Current Outpatient Medications  Medication Sig Dispense Refill   carvedilol (COREG) 6.25 MG tablet TAKE 1 TABLET (6.25 MG TOTAL) BY MOUTH 2 (TWO) TIMES DAILY WITH A MEAL. 180 tablet 3   nystatin (MYCOSTATIN/NYSTOP) powder Apply topically 2 (two) times daily.     nystatin cream (MYCOSTATIN) Apply 1 Application topically 2 (two) times daily. 30 g 0   nystatin-triamcinolone ointment (MYCOLOG) APPLY TO AFFECTED AREA TWICE A DAY 30 g 11   nystatin-triamcinolone ointment (MYCOLOG) Apply 1 Application topically 2 (two) times daily. 30 g 11   ondansetron (ZOFRAN-ODT) 4 MG disintegrating tablet Take 1 tablet (4 mg total) by mouth every 8 (eight) hours as needed for nausea or vomiting. 20 tablet 0   buPROPion (WELLBUTRIN XL) 300 MG 24 hr tablet Take 1 tablet (300 mg total) by mouth daily. 90 tablet 2   risperiDONE (RISPERDAL) 3 MG tablet Take 0.5 tablets (1.5 mg total)  by mouth daily. 45 tablet 2   topiramate (TOPAMAX) 100 MG tablet TAKE 1 TABLET BY MOUTH EVERY DAY (Patient not taking: Reported on 09/05/2023) 90 tablet 2   No current facility-administered medications for this visit.    Medication Side Effects: None  Allergies:  Allergies  Allergen Reactions   Penicillins Rash   Sulfonamide Derivatives Hives    Past Medical History:  Diagnosis Date   Cardiomyopathy    Depression    Fibromyalgia    Gall stones    Hypertension    Osteoarthritis    Sjogren's syndrome (HCC)     Family History  Problem Relation Age of Onset   Hypertension Mother     Social History   Socioeconomic History   Marital status: Married    Spouse name: Not on file   Number of children: Not on file   Years of education: Not on file   Highest education level: Not on file  Occupational History  Not on file  Tobacco Use   Smoking status: Never   Smokeless tobacco: Never  Vaping Use   Vaping status: Never Used  Substance and Sexual Activity   Alcohol use: No    Alcohol/week: 0.0 standard drinks of alcohol   Drug use: Never   Sexual activity: Yes    Birth control/protection: Surgical    Comment: hyst  Other Topics Concern   Not on file  Social History Narrative   Not on file   Social Determinants of Health   Financial Resource Strain: Not on file  Food Insecurity: Not on file  Transportation Needs: Not on file  Physical Activity: Not on file  Stress: Not on file  Social Connections: Not on file  Intimate Partner Violence: Not on file    Past Medical History, Surgical history, Social history, and Family history were reviewed and updated as appropriate.   Please see review of systems for further details on the patient's review from today.   Objective:   Physical Exam:  There were no vitals taken for this visit.  Physical Exam Constitutional:      General: She is not in acute distress. Musculoskeletal:        General: No deformity.   Neurological:     Mental Status: She is alert and oriented to person, place, and time.     Cranial Nerves: No dysarthria.     Coordination: Coordination normal.  Psychiatric:        Attention and Perception: Attention and perception normal. She does not perceive auditory or visual hallucinations.        Mood and Affect: Mood is not anxious or depressed. Affect is not labile or angry.        Speech: Speech normal.        Behavior: Behavior is not slowed. Behavior is cooperative.        Thought Content: Thought content normal. Thought content is not paranoid or delusional. Thought content does not include homicidal or suicidal ideation. Thought content does not include suicidal plan.        Cognition and Memory: Cognition and memory normal.        Judgment: Judgment normal.     Comments: Fair insight. No AIM minimal anxiety Minimal blunting     Lab Review:     Component Value Date/Time   NA 136 03/17/2023 0526   K 3.7 03/17/2023 0526   CL 102 03/17/2023 0526   CO2 25 03/17/2023 0526   GLUCOSE 124 (H) 03/17/2023 0526   BUN 15 03/17/2023 0526   CREATININE 0.77 03/17/2023 0526   CALCIUM 9.2 03/17/2023 0526   PROT 7.3 03/17/2023 0526   ALBUMIN 4.0 03/17/2023 0526   AST 23 03/17/2023 0526   ALT 25 03/17/2023 0526   ALKPHOS 62 03/17/2023 0526   BILITOT 0.9 03/17/2023 0526   GFRNONAA >60 03/17/2023 0526   GFRAA >90 04/30/2012 0613       Component Value Date/Time   WBC 12.1 (H) 03/17/2023 0526   RBC 4.58 03/17/2023 0526   HGB 13.9 03/17/2023 0526   HCT 41.7 03/17/2023 0526   PLT 173 03/17/2023 0526   MCV 91.0 03/17/2023 0526   MCH 30.3 03/17/2023 0526   MCHC 33.3 03/17/2023 0526   RDW 13.2 03/17/2023 0526   LYMPHSABS 0.6 (L) 03/17/2023 0526   MONOABS 1.0 03/17/2023 0526   EOSABS 0.0 03/17/2023 0526   BASOSABS 0.0 03/17/2023 0526    No results found for: "POCLITH", "LITHIUM"   No results  found for: "PHENYTOIN", "PHENOBARB", "VALPROATE", "CBMZ"   .res Assessment:  Plan:    Severe recurrent major depression with psychotic features (HCC) - Plan: buPROPion (WELLBUTRIN XL) 300 MG 24 hr tablet, risperiDONE (RISPERDAL) 3 MG tablet  Generalized anxiety disorder - Plan: risperiDONE (RISPERDAL) 3 MG tablet  Uncontrolled restless legs syndrome   SX in remission with the meds and she has no med complaints.  She's pleased and satisfied.  History of relapse with attempts to decrease the Risperidone.  Therefore need to continue the current dosage.  She wants to consider coming off some of the medication but it would be risky to do so.  She agrees to defer med changes but wants to consider it later. Retired May 05, 2021.  Discussed potential metabolic side effects associated with atypical antipsychotics, as well as potential risk for movement side effects. Advised pt to contact office if movement side effects occur.  NoAIM Disc risk risperidone might cause or worsen RLS.  If gets worse call.  She doesn't feel this is bad enough to treat. Rare RLS  Disc importance of staying active in retirement.  Some concerns her plans seem inadequate and son agrees. Disc relapse prevention.  No med changes today and she agrees. She continued on Wellbutrin XL 300 mg in the morning, , risperidone 3 mg 1/2 tablet nightly,  Ok off topiramate.  FU 9 mos  Meredith Staggers, MD, DFAPA   Please see After Visit Summary for patient specific instructions.  Future Appointments  Date Time Provider Department Center  09/05/2023  1:20 PM Pollyann Savoy, MD [facility] None  09/06/2023  2:40 PM Pricilla Riffle, MD [facility]    No orders of the defined types were placed in this encounter.     -------------------------------

## 2023-09-06 ENCOUNTER — Encounter: Payer: Self-pay | Admitting: Internal Medicine

## 2023-09-06 ENCOUNTER — Ambulatory Visit: Payer: BC Managed Care – PPO | Attending: Internal Medicine | Admitting: Internal Medicine

## 2023-09-06 VITALS — BP 118/70 | HR 74 | Ht 65.0 in | Wt 236.6 lb

## 2023-09-06 DIAGNOSIS — I10 Essential (primary) hypertension: Secondary | ICD-10-CM | POA: Diagnosis not present

## 2023-09-06 DIAGNOSIS — R072 Precordial pain: Secondary | ICD-10-CM

## 2023-09-06 NOTE — Patient Instructions (Signed)
Medication Instructions:  Your physician recommends that you continue on your current medications as directed. Please refer to the Current Medication list given to you today.  *If you need a refill on your cardiac medications before your next appointment, please call your pharmacy*   Lab Work: NONE   If you have labs (blood work) drawn today and your tests are completely normal, you will receive your results only by: MyChart Message (if you have MyChart) OR A paper copy in the mail If you have any lab test that is abnormal or we need to change your treatment, we will call you to review the results.   Testing/Procedures: NONE    Follow-Up: At [facility], you and your health needs are our priority.  As part of our continuing mission to provide you with exceptional heart care, we have created designated Provider Care Teams.  These Care Teams include your primary Cardiologist (physician) and Advanced Practice Providers (APPs -  Physician Assistants and Nurse Practitioners) who all work together to provide you with the care you need, when you need it.  We recommend signing up for the patient portal called "MyChart".  Sign up information is provided on this After Visit Summary.  MyChart is used to connect with patients for Virtual Visits (Telemedicine).  Patients are able to view lab/test results, encounter notes, upcoming appointments, etc.  Non-urgent messages can be sent to your provider as well.   To learn more about what you can do with MyChart, go to [email or web address].    Your next appointment:   2 year(s)  Provider:   You may see Dietrich Pates, MD or one of the following Advanced Practice Providers on your designated Care Team:   Randall An, PA-C  Jacolyn Reedy, PA-C     Other Instructions Thank you for choosing [facility]!

## 2024-01-17 LAB — LAB REPORT - SCANNED
A1c: 5.5
EGFR (Non-African Amer.): 53
TSH: 2.776

## 2024-05-06 DIAGNOSIS — I509 Heart failure, unspecified: Secondary | ICD-10-CM | POA: Diagnosis not present

## 2024-05-06 DIAGNOSIS — M3500 Sicca syndrome, unspecified: Secondary | ICD-10-CM | POA: Diagnosis not present

## 2024-05-06 DIAGNOSIS — M797 Fibromyalgia: Secondary | ICD-10-CM | POA: Diagnosis not present

## 2024-05-21 NOTE — Progress Notes (Signed)
 Office Visit Note  Patient: Brittany Sweeney             Date of Birth: 30-Dec-1960           MRN: [id number]             PCP: Trudy Vaughn FALCON, MD Referring: Trudy Vaughn FALCON, MD Visit Date: 06/04/2024 Occupation: @GUAROCC @  Subjective:  Dry mouth and dry eyes  History of Present Illness: Brittany Sweeney is a 63 y.o. female with Sjogren's, osteoarthritis and fibromyalgia syndrome.  She returns today after her last visit in November 2024.  She continues to have dry eyes for which she has been using eyedrops.  Mild is manageable.  She also has some nasal dryness.  She has generalized achiness from fibromyalgia.  She states the symptoms are worse when it is Ro and outside.  He is currently not having trochanteric bursae discomfort.  She states the restless leg syndrome is better since she discontinued Topamax .    Activities of Daily Living:  Patient reports morning stiffness for 5 minutes.   Patient Reports nocturnal pain.  Difficulty dressing/grooming: Denies Difficulty climbing stairs: Denies Difficulty getting out of chair: Denies Difficulty using hands for taps, buttons, cutlery, and/or writing: Denies  Review of Systems  Constitutional:  Positive for fatigue.  HENT:  Negative for mouth sores and mouth dryness.   Eyes:  Positive for dryness.  Respiratory:  Negative for shortness of breath.   Cardiovascular:  Negative for chest pain and palpitations.  Gastrointestinal:  Negative for blood in stool, constipation and diarrhea.  Endocrine: Negative for increased urination.  Genitourinary:  Negative for involuntary urination.  Musculoskeletal:  Positive for joint pain, joint pain, myalgias, morning stiffness and myalgias. Negative for gait problem, joint swelling, muscle weakness and muscle tenderness.  Skin:  Positive for sensitivity to sunlight. Negative for color change, rash and hair loss.  Allergic/Immunologic: Negative for susceptible to infections.  Neurological:   Negative for dizziness and headaches.  Hematological:  Negative for swollen glands.  Psychiatric/Behavioral:  Positive for depressed mood. Negative for sleep disturbance. The patient is not nervous/anxious.     PMFS History:  Patient Active Problem List   Diagnosis Date Noted   Sjogren's syndrome (HCC) 09/22/2016   Fibromyalgia 09/22/2016   Chronic pain syndrome 09/22/2016   Osteoarthritis of both hands 09/22/2016   Migraine 09/22/2016   Schizophrenia, paranoid type (HCC) 04/25/2012   DEPRESSION 10/21/2010   OVERWEIGHT/OBESITY 12/18/2009   Hypertension 12/18/2009   Secondary cardiomyopathy (HCC) 12/18/2009    Past Medical History:  Diagnosis Date   Cardiomyopathy    Depression    Fibromyalgia    Gall stones    Hypertension    Osteoarthritis    Sjogren's syndrome (HCC)     Family History  Problem Relation Age of Onset   Hypertension Mother    Past Surgical History:  Procedure Laterality Date   ABDOMINAL HYSTERECTOMY     Social History   Social History Narrative   Not on file   Immunization History  Administered Date(s) Administered   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  Constitutional:      Appearance: She is well-developed.  HENT:  Head: Normocephalic and atraumatic.  Eyes:     Conjunctiva/sclera: Conjunctivae normal.  Cardiovascular:     Rate and Rhythm: Normal rate and regular rhythm.     Heart sounds: Normal heart sounds.  Pulmonary:     Effort: Pulmonary effort is normal.     Breath sounds: Normal breath sounds.  Abdominal:     General: Bowel sounds are normal.     Palpations: Abdomen is soft.  Musculoskeletal:     Cervical back: Normal range of motion.  Lymphadenopathy:     Cervical: No cervical adenopathy.   Skin:    General: Skin is warm and dry.     Capillary Refill: Capillary refill takes less than 2 seconds.  Neurological:     Mental Status: She is alert and oriented to person, place, and time.  Psychiatric:        Behavior: Behavior normal.      Musculoskeletal Exam: Cervical spine was in good range of motion.  She had good mobility in her lumbar spine.  There was no tenderness over thoracic or lumbar spine.  Shoulders, elbows, wrist joints, MCPs PIPs and DIPs with good range of motion.  She had bilateral PIP and DIP thickening with no synovitis.  Right hip joint was in good range of motion.  She has some limitation with range of motion of her left hip joint.  Knee joints in good range of motion without any warmth swelling or effusion.  There was no tenderness over her ankles or MTPs.  CDAI Exam: CDAI Score: -- Patient Global: --; Provider Global: -- Swollen: --; Tender: -- Joint Exam 06/04/2024   No joint exam has been documented for this visit   There is currently no information documented on the homunculus. Go to the Rheumatology activity and complete the homunculus joint exam.  Investigation: No additional findings.  Imaging: No results found.  Recent Labs: Lab Results  Component Value Date   WBC 12.1 (H) 03/17/2023   HGB 13.9 03/17/2023   PLT 173 03/17/2023   NA 136 03/17/2023   K 3.7 03/17/2023   CL 102 03/17/2023   CO2 25 03/17/2023   GLUCOSE 124 (H) 03/17/2023   BUN 15 03/17/2023   CREATININE 0.77 03/17/2023   BILITOT 0.9 03/17/2023   ALKPHOS 62 03/17/2023   AST 23 03/17/2023   ALT 25 03/17/2023   PROT 7.3 03/17/2023   ALBUMIN 4.0 03/17/2023   CALCIUM 9.2 03/17/2023   GFRAA >90 04/30/2012    Speciality Comments: No specialty comments available.  Procedures:  No procedures performed Allergies: Penicillins and Sulfonamide derivatives   Assessment / Plan:     Visit Diagnoses: Sjogren's syndrome with other organ involvement (HCC) - Positive ANA,  positive SSA, mild sicca symptoms: She continues to have dry mouth and dry eye symptoms.  She states dry eye symptoms are manageable with over-the-counter eyedrops.  She does not need any additional medications for her dry mouth.  She denies any shortness of breath or lymphadenopathy.  There was no parotid swelling or lymphadenopathy on the examination.  Chest was clear to auscultation.  Patient declined labs.  Primary osteoarthritis of both hands-bilateral PIP and DIP thickening was noted.  No synovitis was noted.  Joint protection was discussed.  Trochanteric bursitis of both hips -she has intermittent tenderness.  Primary osteoarthritis of both feet-she has pes cavus and hammertoes.  Using shoes with arch support were advised.  Fibromyalgia-she continues to have generalized pain from fibromyalgia which is manageable.  Chronic pain syndrome  Secondary cardiomyopathy   Essential hypertension-blood pressure was normal at 123/79.  Migraines  Osteoporosis screening-patient declined DEXA scan.  Calcium rich diet and exercise was emphasized.  Orders: No orders of the defined types were placed in this encounter.  No orders of the defined types were placed in this encounter.    Follow-Up Instructions: Return in about 1 year (around 06/04/2025) for Sjogren's, Osteoarthritis.   Maya Nash, MD  Note - This record has been created using Animal nutritionist.  Chart creation errors have been sought, but may not always  have been located. Such creation errors do not reflect on  the standard of medical care.

## 2024-05-24 ENCOUNTER — Telehealth: Payer: Self-pay

## 2024-05-24 NOTE — Telephone Encounter (Signed)
 Patient contacted the office inquiring if we had gotten a referral for her yet. Advised the patient she should not need a referral because she has been seen in the office within the last three years. Patient states she was advised by insurance that she would need a new referral. Advised the patient we will look Monday to see if we have received anything.

## 2024-05-27 NOTE — Telephone Encounter (Signed)
 Called patient about referral. Pt is scheduled for 06/04/24 with Dr. Dolphus. Pt does not need another referral

## 2024-06-04 ENCOUNTER — Encounter: Payer: Self-pay | Admitting: Psychiatry

## 2024-06-04 ENCOUNTER — Ambulatory Visit: Payer: BC Managed Care – PPO | Attending: Rheumatology | Admitting: Rheumatology

## 2024-06-04 ENCOUNTER — Ambulatory Visit: Payer: BC Managed Care – PPO | Admitting: Psychiatry

## 2024-06-04 ENCOUNTER — Encounter: Payer: Self-pay | Admitting: Rheumatology

## 2024-06-04 VITALS — BP 123/79 | HR 78 | Resp 14 | Ht 65.0 in | Wt 238.0 lb

## 2024-06-04 DIAGNOSIS — G2581 Restless legs syndrome: Secondary | ICD-10-CM | POA: Diagnosis not present

## 2024-06-04 DIAGNOSIS — G43809 Other migraine, not intractable, without status migrainosus: Secondary | ICD-10-CM | POA: Diagnosis not present

## 2024-06-04 DIAGNOSIS — M7061 Trochanteric bursitis, right hip: Secondary | ICD-10-CM

## 2024-06-04 DIAGNOSIS — I10 Essential (primary) hypertension: Secondary | ICD-10-CM | POA: Diagnosis not present

## 2024-06-04 DIAGNOSIS — M797 Fibromyalgia: Secondary | ICD-10-CM | POA: Diagnosis not present

## 2024-06-04 DIAGNOSIS — M7062 Trochanteric bursitis, left hip: Secondary | ICD-10-CM | POA: Diagnosis not present

## 2024-06-04 DIAGNOSIS — M19041 Primary osteoarthritis, right hand: Secondary | ICD-10-CM

## 2024-06-04 DIAGNOSIS — M3509 Sicca syndrome with other organ involvement: Secondary | ICD-10-CM | POA: Diagnosis not present

## 2024-06-04 DIAGNOSIS — F411 Generalized anxiety disorder: Secondary | ICD-10-CM | POA: Diagnosis not present

## 2024-06-04 DIAGNOSIS — F333 Major depressive disorder, recurrent, severe with psychotic symptoms: Secondary | ICD-10-CM | POA: Diagnosis not present

## 2024-06-04 DIAGNOSIS — G43009 Migraine without aura, not intractable, without status migrainosus: Secondary | ICD-10-CM

## 2024-06-04 DIAGNOSIS — M19071 Primary osteoarthritis, right ankle and foot: Secondary | ICD-10-CM | POA: Diagnosis not present

## 2024-06-04 DIAGNOSIS — Z13820 Encounter for screening for osteoporosis: Secondary | ICD-10-CM | POA: Diagnosis not present

## 2024-06-04 DIAGNOSIS — G894 Chronic pain syndrome: Secondary | ICD-10-CM | POA: Diagnosis not present

## 2024-06-04 DIAGNOSIS — M19042 Primary osteoarthritis, left hand: Secondary | ICD-10-CM | POA: Diagnosis not present

## 2024-06-04 DIAGNOSIS — M19072 Primary osteoarthritis, left ankle and foot: Secondary | ICD-10-CM

## 2024-06-04 DIAGNOSIS — I429 Cardiomyopathy, unspecified: Secondary | ICD-10-CM | POA: Diagnosis not present

## 2024-06-04 MED ORDER — BUPROPION HCL ER (XL) 300 MG PO TB24
300.0000 mg | ORAL_TABLET | Freq: Every day | ORAL | 3 refills | Status: AC
Start: 2024-06-04 — End: ?

## 2024-06-04 MED ORDER — RISPERIDONE 3 MG PO TABS
1.5000 mg | ORAL_TABLET | Freq: Every day | ORAL | 3 refills | Status: AC
Start: 2024-06-04 — End: ?

## 2024-06-04 NOTE — Progress Notes (Signed)
 Brittany Sweeney [id number] 11/28/1960 63 y.o.   Subjective:   [name] is a 63 y.o. (DOB June 28, 1961) female.  Chief Complaint:  Chief Complaint  Patient presents with   Follow-up    HPI Brittany Sweeney presents  today for follow-up of depression and anxiety.   seen November 2020 & 02/2020.  No meds were changed.  She continued on Wellbutrin  XL 300 mg in the morning, gabapentin  100 nightly, risperidone  3 mg 1/2 tablet nightly, Topamax  100 mg tablets 1-1/2 tablets daily.  09/14/20 appt with following noted: Dx. Sjogren's syndrome. Tired from working 3rd shift and otherwise Doing fine.  Ready to retire and hopes for May 2022.  If can get insurance.  Sleep is good and better with 2nd shift.   HA managed with topiramate  given originally by neurology. Vaccinated. Plan: No med changes  03/15/2021 appointment with the following noted: Good with mental health.  Not usually depressed.  Going to retire May 05, 2021.  Needs to keep insurance.  Son concerned she won't stay busy enough. Doesn't like her job bc it's hot and hard on body. HA managed Patient reports stable mood and denies depressed or irritable moods. A little restless.  Patient denies any recent difficulty with anxiety.  Patient denies difficulty usually with sleep initiation or maintenance. 8-9 hours.  Denies appetite disturbance.  Patient reports that energy and motivation have been good.  Patient denies any difficulty with concentration.  Patient denies any suicidal ideation.  No fear.  No voices.  08/31/21 appt noted: She continued on Wellbutrin  XL 300 mg in the morning, , risperidone  3 mg 1/2 tablet nightly, Topamax  100 mg tablets 1 tablets daily.  Stopped gabapentin  100 nightly No SE Good.  Retired and a tiny bit of anxiety at night.   Patient reports stable mood and denies depressed or irritable moods.  Patient denies any recent difficulty with anxiety.  Patient denies difficulty with sleep initiation or  maintenance. Denies appetite disturbance.  Patient reports that energy and motivation have been good.  Patient denies any difficulty with concentration.  Patient denies any suicidal ideation. Sleep not as good bc sleeping longer and not working as hard.  A little RLS  03/01/22 appt noted:  good except not sleeping as good. Still gets 9 hours.   But not as solid.  Retired in July and nice. Gets a little bored.  Joined fitness and walks 2 miles daily. No SE with meds. No fear or paranoia or mood swings.   Patient reports stable mood and denies depressed or irritable moods.  Patient denies any recent difficulty with anxiety.  Denies appetite disturbance.  Patient reports that energy and motivation have been good.  Patient denies any difficulty with concentration.  Patient denies any suicidal ideation. Plan: No med changes today and she agrees. She continued on Wellbutrin  XL 300 mg in the morning, , risperidone  3 mg 1/2 tablet nightly, Topamax  100 mg tablets 1 tablets daily.  12/05/22 appt noted: A little bit of anxiety.  Some restlessness and waking at night.  Restless when sitting down.  Will watch TV in day but is worse at night.   Not too bad.  Some wakening at night but will get back to sleep. Consistent with meds.  Meds make her sleepy at night.   Not depressed or irritable.  No fear or mood swings. Overall she feels she sleeps a little better and is less anxious with less topiramate  50 mg daily. Plan no  med changes  01/15/23 TC: considering trial gabapentin  in place of topiramate . MD:  She has reduced topiramate  to 50 mg HS for some time.  Whenever she wants she can stop it and start gabapentin  100 mg 1 at night for 1 week then 2 at night.  Let us  know if she wants us  to send in gabapentin  RX      09/05/23 appt noted: Meds: Better since off topiramate .  RLS is better.  No return of migraine. She continued on Wellbutrin  XL 300 mg in the morning, , risperidone  3 mg 1/2 tablet nightly,  No  SE. Mood and anxiety are better than usual.  No swings.  A tad of anxiety. Sleep good now.   Health is pretty good.  Usually works out daily.  06/04/24 appt noted:  Med:  Wellbutrin  XL 300 mg in the morning, , risperidone  3 mg 1/2 tablet nightly,  RLS controlled without topiramate  and migraine ok. Sleep ok. No SE. Patient reports stable mood and denies depressed or irritable moods.  Patient denies any recent difficulty with anxiety.  Patient denies difficulty with sleep initiation or maintenance. Denies appetite disturbance.  Patient reports that energy and motivation have been good.  Patient denies any difficulty with concentration.  Patient denies any suicidal ideation.   Past Psychiatric Medication Trials: Sertraline  cognitive side effects,  Wellbutrin  300,  risperidone  1.5,  Gabapentin  helped RLS History of psychiatric hospitalization with depression, suicidal thoughts, and paranoia..  Long history of recurrent depression and paranoia  Review of Systems:  Review of Systems  Constitutional:  Negative for fatigue.  Cardiovascular:  Negative for chest pain and palpitations.  Skin:        Skin crawling still some but less and all over but mostly trunk.  Neurological:  Negative for tremors, weakness and headaches.  Psychiatric/Behavioral:  Negative for agitation, behavioral problems, confusion, decreased concentration, dysphoric mood, hallucinations, self-injury, sleep disturbance and suicidal ideas. The patient is not nervous/anxious and is not hyperactive.     Medications: I have reviewed the patient's current medications.  Current Outpatient Medications  Medication Sig Dispense Refill   carvedilol  (COREG ) 6.25 MG tablet TAKE 1 TABLET (6.25 MG TOTAL) BY MOUTH 2 (TWO) TIMES DAILY WITH A MEAL. 180 tablet 3   nystatin  (MYCOSTATIN /NYSTOP ) powder Apply topically as needed.     nystatin  cream (MYCOSTATIN ) Apply 1 Application topically 2 (two) times daily. (Patient taking differently:  Apply 1 Application topically as needed.) 30 g 0   nystatin -triamcinolone  ointment (MYCOLOG) APPLY TO AFFECTED AREA TWICE A DAY (Patient taking differently: as needed.) 30 g 11   buPROPion  (WELLBUTRIN  XL) 300 MG 24 hr tablet Take 1 tablet (300 mg total) by mouth daily. 90 tablet 3   risperiDONE  (RISPERDAL ) 3 MG tablet Take 0.5 tablets (1.5 mg total) by mouth daily. 45 tablet 3   No current facility-administered medications for this visit.    Medication Side Effects: None  Allergies:  Allergies  Allergen Reactions   Penicillins Rash   Sulfonamide Derivatives Hives    Past Medical History:  Diagnosis Date   Cardiomyopathy    Depression    Fibromyalgia    Gall stones    Hypertension    Osteoarthritis    Sjogren's syndrome (HCC)     Family History  Problem Relation Age of Onset   Hypertension Mother     Social History   Socioeconomic History   Marital status: Married    Spouse name: Not on file   Number of children: Not on  file   Years of education: Not on file   Highest education level: Not on file  Occupational History   Not on file  Tobacco Use   Smoking status: Never    Passive exposure: Past   Smokeless tobacco: Never  Vaping Use   Vaping status: Never Used  Substance and Sexual Activity   Alcohol use: No    Alcohol/week: 0.0 standard drinks of alcohol   Drug use: Never   Sexual activity: Yes    Birth control/protection: Surgical    Comment: hyst  Other Topics Concern   Not on file  Social History Narrative   Not on file   Social Drivers of Health   Financial Resource Strain: Not on file  Food Insecurity: Not on file  Transportation Needs: Not on file  Physical Activity: Not on file  Stress: Not on file  Social Connections: Not on file  Intimate Partner Violence: Not on file    Past Medical History, Surgical history, Social history, and Family history were reviewed and updated as appropriate.   Please see review of systems for further details  on the patient's review from today.   Objective:   Physical Exam:  There were no vitals taken for this visit.  Physical Exam Constitutional:      General: She is not in acute distress. Musculoskeletal:        General: No deformity.  Neurological:     Mental Status: She is alert and oriented to person, place, and time.     Cranial Nerves: No dysarthria.     Coordination: Coordination normal.  Psychiatric:        Attention and Perception: Attention and perception normal. She does not perceive auditory or visual hallucinations.        Mood and Affect: Mood is not anxious or depressed. Affect is not labile or angry.        Speech: Speech normal. Speech is not rapid and pressured.        Behavior: Behavior is not slowed. Behavior is cooperative.        Thought Content: Thought content normal. Thought content is not paranoid or delusional. Thought content does not include homicidal or suicidal ideation. Thought content does not include suicidal plan.        Cognition and Memory: Cognition and memory normal.        Judgment: Judgment normal.     Comments: Fair insight. No AIM minimal anxiety Minimal blunting     Lab Review:     Component Value Date/Time   NA 136 03/17/2023 0526   K 3.7 03/17/2023 0526   CL 102 03/17/2023 0526   CO2 25 03/17/2023 0526   GLUCOSE 124 (H) 03/17/2023 0526   BUN 15 03/17/2023 0526   CREATININE 0.77 03/17/2023 0526   CALCIUM 9.2 03/17/2023 0526   PROT 7.3 03/17/2023 0526   ALBUMIN 4.0 03/17/2023 0526   AST 23 03/17/2023 0526   ALT 25 03/17/2023 0526   ALKPHOS 62 03/17/2023 0526   BILITOT 0.9 03/17/2023 0526   GFRNONAA >60 03/17/2023 0526   GFRAA >90 04/30/2012 0613       Component Value Date/Time   WBC 12.1 (H) 03/17/2023 0526   RBC 4.58 03/17/2023 0526   HGB 13.9 03/17/2023 0526   HCT 41.7 03/17/2023 0526   PLT 173 03/17/2023 0526   MCV 91.0 03/17/2023 0526   MCH 30.3 03/17/2023 0526   MCHC 33.3 03/17/2023 0526   RDW 13.2 03/17/2023  0526   LYMPHSABS  0.6 (L) 03/17/2023 0526   MONOABS 1.0 03/17/2023 0526   EOSABS 0.0 03/17/2023 0526   BASOSABS 0.0 03/17/2023 0526    No results found for: POCLITH, LITHIUM   No results found for: PHENYTOIN, PHENOBARB, VALPROATE, CBMZ   .res Assessment: Plan:    Severe recurrent major depression with psychotic features (HCC) - Plan: risperiDONE  (RISPERDAL ) 3 MG tablet, buPROPion  (WELLBUTRIN  XL) 300 MG 24 hr tablet  Generalized anxiety disorder - Plan: risperiDONE  (RISPERDAL ) 3 MG tablet  Migraine without aura and without status migrainosus, not intractable  Restless leg syndrome, controlled   SX in remission with the meds and she has no med complaints.  She's pleased and satisfied.  History of relapse with attempts to decrease the Risperidone .  Therefore need to continue the current dosage.  She wants to consider coming off some of the medication but it would be risky to do so.  She agrees to defer med changes but wants to consider it later. Retired May 05, 2021.  Discussed potential metabolic side effects associated with atypical antipsychotics, as well as potential risk for movement side effects. Advised pt to contact office if movement side effects occur.  NoAIM Disc risk risperidone  might cause or worsen RLS.  If gets worse call.  She doesn't feel this is bad enough to treat. Rare RLS  Disc importance of staying active in retirement.  Some concerns her plans seem inadequate and son agrees. Disc relapse prevention.  No med changes today and she agrees. She continued on Wellbutrin  XL 300 mg in the morning, , risperidone  3 mg 1/2 tablet nightly,  Ok off topiramate .  FU 12 mos  Lorene Macintosh, MD, DFAPA   Please see After Visit Summary for patient specific instructions.  Future Appointments  Date Time Provider Department Center  06/04/2024 11:20 AM Dolphus Reiter, MD [facility] None    No orders of the defined types were placed in this encounter.      -------------------------------

## 2024-06-11 ENCOUNTER — Encounter: Payer: Self-pay | Admitting: Internal Medicine

## 2025-06-04 ENCOUNTER — Ambulatory Visit: Admitting: Rheumatology

## 2025-06-04 ENCOUNTER — Ambulatory Visit: Admitting: Psychiatry
